# Patient Record
Sex: Male | Born: 1950 | Race: White | Hispanic: No | Smoking: Former smoker
Health system: Southern US, Community
[De-identification: ages and names within clinical notes are randomized; demographics above are authoritative.]

## PROBLEM LIST (undated history)

## (undated) DIAGNOSIS — I10 Essential (primary) hypertension: Secondary | ICD-10-CM

## (undated) DIAGNOSIS — Z8679 Personal history of other diseases of the circulatory system: Secondary | ICD-10-CM

## (undated) DIAGNOSIS — H53032 Strabismic amblyopia, left eye: Secondary | ICD-10-CM

## (undated) DIAGNOSIS — R32 Unspecified urinary incontinence: Secondary | ICD-10-CM

## (undated) DIAGNOSIS — M1990 Unspecified osteoarthritis, unspecified site: Secondary | ICD-10-CM

## (undated) DIAGNOSIS — R319 Hematuria, unspecified: Secondary | ICD-10-CM

## (undated) DIAGNOSIS — C801 Malignant (primary) neoplasm, unspecified: Secondary | ICD-10-CM

## (undated) DIAGNOSIS — R399 Unspecified symptoms and signs involving the genitourinary system: Secondary | ICD-10-CM

## (undated) DIAGNOSIS — H33191 Other retinoschisis and retinal cysts, right eye: Secondary | ICD-10-CM

## (undated) DIAGNOSIS — F411 Generalized anxiety disorder: Secondary | ICD-10-CM

## (undated) DIAGNOSIS — K219 Gastro-esophageal reflux disease without esophagitis: Secondary | ICD-10-CM

## (undated) DIAGNOSIS — F419 Anxiety disorder, unspecified: Secondary | ICD-10-CM

## (undated) DIAGNOSIS — R000 Tachycardia, unspecified: Secondary | ICD-10-CM

## (undated) DIAGNOSIS — I4891 Unspecified atrial fibrillation: Secondary | ICD-10-CM

## (undated) DIAGNOSIS — D649 Anemia, unspecified: Secondary | ICD-10-CM

## (undated) DIAGNOSIS — C679 Malignant neoplasm of bladder, unspecified: Secondary | ICD-10-CM

## (undated) DIAGNOSIS — K5909 Other constipation: Secondary | ICD-10-CM

## (undated) HISTORY — PX: CATARACT EXTRACTION W/ INTRAOCULAR LENS IMPLANT: SHX1309

## (undated) HISTORY — PX: STRABISMUS SURGERY: SHX218

## (undated) HISTORY — PX: ORIF FOOT FRACTURE: SHX2123

## (undated) HISTORY — PX: LUMBAR SPINE SURGERY: SHX701

## (undated) HISTORY — PX: ROTATOR CUFF REPAIR: SHX139

## (undated) HISTORY — PX: POSTERIOR CERVICAL LAMINECTOMY: SHX2248

## (undated) HISTORY — PX: PERCUTANEOUS PINNING: SHX2209

## (undated) HISTORY — PX: EYE SURGERY: SHX253

## (undated) HISTORY — PX: PROSTATECTOMY: SHX69

## (undated) HISTORY — PX: KNEE ARTHROSCOPY: SUR90

## (undated) HISTORY — PX: BACK SURGERY: SHX140

## (undated) HISTORY — PX: THUMB AMPUTATION: SHX804

## (undated) HISTORY — PX: CERVICAL LAMINECTOMY: SHX94

---

## 1998-05-19 DIAGNOSIS — Z8546 Personal history of malignant neoplasm of prostate: Secondary | ICD-10-CM

## 1998-05-19 DIAGNOSIS — Z923 Personal history of irradiation: Secondary | ICD-10-CM

## 1998-05-19 HISTORY — DX: Personal history of irradiation: Z92.3

## 1998-05-19 HISTORY — DX: Personal history of malignant neoplasm of prostate: Z85.46

## 1998-05-19 HISTORY — PX: RETROPUBIC PROSTATECTOMY: SUR1055

## 1998-09-13 ENCOUNTER — Other Ambulatory Visit: Admission: RE | Admit: 1998-09-13 | Discharge: 1998-09-13 | Payer: Self-pay | Admitting: Urology

## 1998-10-25 ENCOUNTER — Encounter: Payer: Self-pay | Admitting: Orthopedic Surgery

## 1998-10-29 ENCOUNTER — Inpatient Hospital Stay (HOSPITAL_COMMUNITY): Admission: RE | Admit: 1998-10-29 | Discharge: 1998-11-01 | Payer: Self-pay | Admitting: Urology

## 1999-05-29 ENCOUNTER — Encounter: Admission: RE | Admit: 1999-05-29 | Discharge: 1999-08-27 | Payer: Self-pay | Admitting: Radiation Oncology

## 2001-05-19 HISTORY — PX: THUMB AMPUTATION: SHX804

## 2001-06-10 ENCOUNTER — Ambulatory Visit (HOSPITAL_COMMUNITY): Admission: RE | Admit: 2001-06-10 | Discharge: 2001-06-10 | Payer: Self-pay | Admitting: Internal Medicine

## 2002-01-04 ENCOUNTER — Inpatient Hospital Stay (HOSPITAL_COMMUNITY): Admission: RE | Admit: 2002-01-04 | Discharge: 2002-01-05 | Payer: Self-pay | Admitting: Orthopedic Surgery

## 2002-04-20 ENCOUNTER — Encounter: Payer: Self-pay | Admitting: Orthopedic Surgery

## 2002-04-20 ENCOUNTER — Encounter: Admission: RE | Admit: 2002-04-20 | Discharge: 2002-04-20 | Payer: Self-pay | Admitting: Orthopedic Surgery

## 2002-04-22 ENCOUNTER — Ambulatory Visit (HOSPITAL_COMMUNITY): Admission: RE | Admit: 2002-04-22 | Discharge: 2002-04-22 | Payer: Self-pay | Admitting: Orthopedic Surgery

## 2002-04-22 ENCOUNTER — Encounter: Payer: Self-pay | Admitting: Orthopedic Surgery

## 2002-05-09 ENCOUNTER — Encounter: Payer: Self-pay | Admitting: Orthopedic Surgery

## 2002-05-09 ENCOUNTER — Ambulatory Visit (HOSPITAL_COMMUNITY): Admission: RE | Admit: 2002-05-09 | Discharge: 2002-05-09 | Payer: Self-pay | Admitting: Orthopedic Surgery

## 2002-05-19 HISTORY — PX: ROTATOR CUFF REPAIR: SHX139

## 2004-12-23 ENCOUNTER — Ambulatory Visit (HOSPITAL_COMMUNITY): Admission: RE | Admit: 2004-12-23 | Discharge: 2004-12-23 | Payer: Self-pay | Admitting: Orthopedic Surgery

## 2010-08-19 DIAGNOSIS — R079 Chest pain, unspecified: Secondary | ICD-10-CM

## 2013-11-09 NOTE — H&P (Signed)
  NTS SOAP Note  Vital Signs:  Vitals as of: 8/56/3149: Systolic 702: Diastolic 99: Heart Rate 81: Temp 97.56F: Height 16ft 9in: Weight 225Lbs 0 Ounces: Pain Level 2: BMI 33.23  BMI : 33.23 kg/m2  Subjective: This 63 Years 18 Months old Male presents for of an umbilical hernia.  Has been present for some time, but is increasing in size and causing him discomfort.  Review of Symptoms:  Constitutional:unremarkable   Head:unremarkable    Eyes:unremarkable   sinus problems Cardiovascular:  unremarkable   Respiratory:unremarkable   Gastrointestin    abdominal pain,nausea,heartburn Genitourinary:unremarkable       joint, neck, and back pain Skin:unremarkable Hematolgic/Lymphatic:unremarkable     Allergic/Immunologic:unremarkable     Past Medical History:    Reviewed  Past Medical History  Surgical History: back, prostate, hernia, shoulder, knee, eye Medical Problems: reflux Allergies: nkda Medications: pantoprazole, allegra, fluticasome nasal spray   Social History:Reviewed  Social History  Preferred Language: English Race:  White Ethnicity: Not Hispanic / Latino Age: 63 Years 7 Months Marital Status:  M Alcohol: no   Smoking Status: Never smoker reviewed on 11/08/2013 Functional Status reviewed on 11/08/2013 ------------------------------------------------ Bathing: Normal Cooking: Normal Dressing: Normal Driving: Normal Eating: Normal Managing Meds: Normal Oral Care: Normal Shopping: Normal Toileting: Normal Transferring: Normal Walking: Normal Cognitive Status reviewed on 11/08/2013 ------------------------------------------------ Attention: Normal Decision Making: Normal Language: Normal Memory: Normal Motor: Normal Perception: Normal Problem Solving: Normal Visual and Spatial: Normal   Family History:  Reviewed  Family Health History Mother  Father, Deceased; Alzheimer's disease;     Objective  Information: General:  Well appearing, well nourished in no distress. Heart:  RRR, no murmur Lungs:    CTA bilaterally, no wheezes, rhonchi, rales.  Breathing unlabored. Abdomen:Soft, NT/ND, no HSM, no masses.  Reudcible umbilical hernia noted.  Assessment:Umbilical hernia  Diagnoses: 637.8 Umbilical hernia (Umbilical hernia without obstruction or gangrene)  Procedures: 58850 - OFFICE OUTPATIENT NEW 30 MINUTES    Plan:  Scheduled for umbilicla herniorrhaphy with mesh on 11/23/13.   Patient Education:Alternative treatments to surgery were discussed with patient (and family).  Risks and benefits  of procedure including bleeding, infection, mesh use, and the possibility of an open procedure were fully explained to the patient (and family) who gave informed consent. Patient/family questions were addressed.  Follow-up:Pending Surgery

## 2013-11-17 ENCOUNTER — Encounter (HOSPITAL_COMMUNITY)
Admission: RE | Admit: 2013-11-17 | Discharge: 2013-11-17 | Disposition: A | Discharge status: Home / Self Care | Payer: BC Managed Care – PPO | Source: Ambulatory Visit | Attending: General Surgery | Admitting: General Surgery

## 2013-11-17 ENCOUNTER — Encounter (HOSPITAL_COMMUNITY): Payer: Self-pay | Admitting: Pharmacy Technician

## 2013-11-17 ENCOUNTER — Encounter (HOSPITAL_COMMUNITY): Payer: Self-pay

## 2013-11-17 DIAGNOSIS — Z01812 Encounter for preprocedural laboratory examination: Secondary | ICD-10-CM | POA: Insufficient documentation

## 2013-11-17 DIAGNOSIS — Z01810 Encounter for preprocedural cardiovascular examination: Secondary | ICD-10-CM | POA: Insufficient documentation

## 2013-11-17 HISTORY — DX: Malignant (primary) neoplasm, unspecified: C80.1

## 2013-11-17 HISTORY — DX: Gastro-esophageal reflux disease without esophagitis: K21.9

## 2013-11-17 LAB — BASIC METABOLIC PANEL
ANION GAP: 11 (ref 5–15)
BUN: 8 mg/dL (ref 6–23)
CALCIUM: 9.4 mg/dL (ref 8.4–10.5)
CO2: 28 meq/L (ref 19–32)
CREATININE: 0.74 mg/dL (ref 0.50–1.35)
Chloride: 104 mEq/L (ref 96–112)
Glucose, Bld: 69 mg/dL — ABNORMAL LOW (ref 70–99)
Potassium: 4.2 mEq/L (ref 3.7–5.3)
SODIUM: 143 meq/L (ref 137–147)

## 2013-11-17 LAB — CBC WITH DIFFERENTIAL/PLATELET
BASOS ABS: 0 10*3/uL (ref 0.0–0.1)
BASOS PCT: 1 % (ref 0–1)
Eosinophils Absolute: 0.2 10*3/uL (ref 0.0–0.7)
Eosinophils Relative: 4 % (ref 0–5)
HEMATOCRIT: 44.9 % (ref 39.0–52.0)
Hemoglobin: 15.1 g/dL (ref 13.0–17.0)
Lymphocytes Relative: 27 % (ref 12–46)
Lymphs Abs: 1.4 10*3/uL (ref 0.7–4.0)
MCH: 30.7 pg (ref 26.0–34.0)
MCHC: 33.6 g/dL (ref 30.0–36.0)
MCV: 91.3 fL (ref 78.0–100.0)
MONO ABS: 0.6 10*3/uL (ref 0.1–1.0)
Monocytes Relative: 11 % (ref 3–12)
NEUTROS ABS: 3.1 10*3/uL (ref 1.7–7.7)
Neutrophils Relative %: 59 % (ref 43–77)
PLATELETS: 304 10*3/uL (ref 150–400)
RBC: 4.92 MIL/uL (ref 4.22–5.81)
RDW: 13.7 % (ref 11.5–15.5)
WBC: 5.4 10*3/uL (ref 4.0–10.5)

## 2013-11-17 NOTE — Patient Instructions (Signed)
Philip Mcneil  11/17/2013   Your procedure is scheduled on:  11/23/2013  Report to [facility] at  25 AM.  Call this number if you have problems the morning of surgery: [phone]   Remember:   Do not eat food or drink liquids after midnight.   Take these medicines the morning of surgery with A SIP OF WATER: none   Do not wear jewelry, make-up or nail polish.  Do not wear lotions, powders, or perfumes.   Do not shave 48 hours prior to surgery. Men may shave face and neck.  Do not bring valuables to the hospital.  [facility] is not responsible for any belongings or valuables.               Contacts, dentures or bridgework may not be worn into surgery.  Leave suitcase in the car. After surgery it may be brought to your room.  For patients admitted to the hospital, discharge time is determined by your treatment team.               Patients discharged the day of surgery will not be allowed to drive home.  Name and phone number of your driver: family  Special Instructions: Shower using CHG 2 nights before surgery and the night before surgery.  If you shower the day of surgery use CHG.  Use special wash - you have one bottle of CHG for all showers.  You should use approximately 1/3 of the bottle for each shower.   Please read over the following fact sheets that you were given: Pain Booklet, Coughing and Deep Breathing, Surgical Site Infection Prevention, Anesthesia Post-op Instructions and Care and Recovery After Surgery Hernia A hernia occurs when an internal organ pushes out through a weak spot in the abdominal wall. Hernias most commonly occur in the groin and around the navel. Hernias often can be pushed back into place (reduced). Most hernias tend to get worse over time. Some abdominal hernias can get stuck in the opening (irreducible or incarcerated hernia) and cannot be reduced. An irreducible abdominal hernia which is tightly squeezed into the opening is at risk for impaired blood  supply (strangulated hernia). A strangulated hernia is a medical emergency. Because of the risk for an irreducible or strangulated hernia, surgery may be recommended to repair a hernia. CAUSES   Heavy lifting.  Prolonged coughing.  Straining to have a bowel movement.  A cut (incision) made during an abdominal surgery. HOME CARE INSTRUCTIONS   Bed rest is not required. You may continue your normal activities.  Avoid lifting more than 10 pounds (4.5 kg) or straining.  Cough gently. If you are a smoker it is best to stop. Even the best hernia repair can break down with the continual strain of coughing. Even if you do not have your hernia repaired, a cough will continue to aggravate the problem.  Do not wear anything tight over your hernia. Do not try to keep it in with an outside bandage or truss. These can damage abdominal contents if they are trapped within the hernia sac.  Eat a normal diet.  Avoid constipation. Straining over long periods of time will increase hernia size and encourage breakdown of repairs. If you cannot do this with diet alone, stool softeners may be used. SEEK IMMEDIATE MEDICAL CARE IF:   You have a fever.  You develop increasing abdominal pain.  You feel nauseous or vomit.  Your hernia is stuck outside the  abdomen, looks discolored, feels hard, or is tender.  You have any changes in your bowel habits or in the hernia that are unusual for you.  You have increased pain or swelling around the hernia.  You cannot push the hernia back in place by applying gentle pressure while lying down. MAKE SURE YOU:   Understand these instructions.  Will watch your condition.  Will get help right away if you are not doing well or get worse. Document Released: 05/05/2005 Document Revised: 07/28/2011 Document Reviewed: 12/23/2007 [facility] Patient Information 2015 [place], [place]. This information is not intended to replace advice given to you by your health care  provider. Make sure you discuss any questions you have with your health care provider. PATIENT INSTRUCTIONS POST-ANESTHESIA  IMMEDIATELY FOLLOWING SURGERY:  Do not drive or operate machinery for the first twenty four hours after surgery.  Do not make any important decisions for twenty four hours after surgery or while taking narcotic pain medications or sedatives.  If you develop intractable nausea and vomiting or a severe headache please notify your doctor immediately.  FOLLOW-UP:  Please make an appointment with your surgeon as instructed. You do not need to follow up with anesthesia unless specifically instructed to do so.  WOUND CARE INSTRUCTIONS (if applicable):  Keep a dry clean dressing on the anesthesia/puncture wound site if there is drainage.  Once the wound has quit draining you may leave it open to air.  Generally you should leave the bandage intact for twenty four hours unless there is drainage.  If the epidural site drains for more than 36-48 hours please call the anesthesia department.  QUESTIONS?:  Please feel free to call your physician or the hospital operator if you have any questions, and they will be happy to assist you.

## 2013-11-17 NOTE — Pre-Procedure Instructions (Signed)
Patient given information to sign up for my chart at home. 

## 2013-11-23 ENCOUNTER — Encounter (HOSPITAL_COMMUNITY)
Admission: RE | Disposition: A | Discharge status: Home / Self Care | Payer: Self-pay | Source: Ambulatory Visit | Attending: General Surgery

## 2013-11-23 ENCOUNTER — Ambulatory Visit (HOSPITAL_COMMUNITY): Payer: BC Managed Care – PPO | Admitting: Anesthesiology

## 2013-11-23 ENCOUNTER — Ambulatory Visit (HOSPITAL_COMMUNITY)
Admission: RE | Admit: 2013-11-23 | Discharge: 2013-11-23 | Disposition: A | Discharge status: Home / Self Care | Payer: BC Managed Care – PPO | Source: Ambulatory Visit | Attending: General Surgery | Admitting: General Surgery

## 2013-11-23 ENCOUNTER — Encounter (HOSPITAL_COMMUNITY): Payer: BC Managed Care – PPO | Admitting: Anesthesiology

## 2013-11-23 ENCOUNTER — Encounter (HOSPITAL_COMMUNITY): Payer: Self-pay | Admitting: *Deleted

## 2013-11-23 DIAGNOSIS — Z79899 Other long term (current) drug therapy: Secondary | ICD-10-CM | POA: Insufficient documentation

## 2013-11-23 DIAGNOSIS — K429 Umbilical hernia without obstruction or gangrene: Secondary | ICD-10-CM | POA: Insufficient documentation

## 2013-11-23 DIAGNOSIS — K219 Gastro-esophageal reflux disease without esophagitis: Secondary | ICD-10-CM | POA: Insufficient documentation

## 2013-11-23 HISTORY — PX: INSERTION OF MESH: SHX5868

## 2013-11-23 HISTORY — PX: UMBILICAL HERNIA REPAIR: SHX196

## 2013-11-23 SURGERY — REPAIR, HERNIA, UMBILICAL, ADULT
Anesthesia: General | Site: Abdomen

## 2013-11-23 MED ORDER — ONDANSETRON HCL 4 MG/2ML IJ SOLN
4.0000 mg | Freq: Once | INTRAMUSCULAR | Status: AC
  Administered 2013-11-23: 4 mg via INTRAVENOUS

## 2013-11-23 MED ORDER — GLYCOPYRROLATE 0.2 MG/ML IJ SOLN
0.2000 mg | Freq: Once | INTRAMUSCULAR | Status: AC
  Administered 2013-11-23: 0.2 mg via INTRAVENOUS

## 2013-11-23 MED ORDER — KETOROLAC TROMETHAMINE 30 MG/ML IJ SOLN
30.0000 mg | Freq: Once | INTRAMUSCULAR | Status: AC
  Administered 2013-11-23: 30 mg via INTRAVENOUS
  Filled 2013-11-23: qty 1

## 2013-11-23 MED ORDER — GLYCOPYRROLATE 0.2 MG/ML IJ SOLN
INTRAMUSCULAR | Status: AC
  Filled 2013-11-23: qty 1

## 2013-11-23 MED ORDER — CEFAZOLIN SODIUM-DEXTROSE 2-3 GM-% IV SOLR
2.0000 g | INTRAVENOUS | Status: AC
  Administered 2013-11-23: 2 g via INTRAVENOUS
  Filled 2013-11-23: qty 50

## 2013-11-23 MED ORDER — POVIDONE-IODINE 10 % OINT PACKET
TOPICAL_OINTMENT | CUTANEOUS | Status: DC | PRN
  Administered 2013-11-23: 2 via TOPICAL

## 2013-11-23 MED ORDER — LIDOCAINE HCL 1 % IJ SOLN
INTRAMUSCULAR | Status: DC | PRN
  Administered 2013-11-23: 30 mg via INTRADERMAL

## 2013-11-23 MED ORDER — ONDANSETRON HCL 4 MG/2ML IJ SOLN: 4.0000 mg | Freq: Once | INTRAMUSCULAR | Status: DC | PRN

## 2013-11-23 MED ORDER — FENTANYL CITRATE 0.05 MG/ML IJ SOLN
INTRAMUSCULAR | Status: DC | PRN
  Administered 2013-11-23: 25 ug via INTRAVENOUS
  Administered 2013-11-23: 50 ug via INTRAVENOUS
  Administered 2013-11-23: 25 ug via INTRAVENOUS
  Administered 2013-11-23: 50 ug via INTRAVENOUS
  Administered 2013-11-23 (×2): 25 ug via INTRAVENOUS
  Administered 2013-11-23: 50 ug via INTRAVENOUS

## 2013-11-23 MED ORDER — FENTANYL CITRATE 0.05 MG/ML IJ SOLN
INTRAMUSCULAR | Status: AC
  Filled 2013-11-23: qty 2

## 2013-11-23 MED ORDER — POVIDONE-IODINE 10 % EX OINT
TOPICAL_OINTMENT | CUTANEOUS | Status: AC
  Filled 2013-11-23: qty 2

## 2013-11-23 MED ORDER — PROPOFOL 10 MG/ML IV BOLUS
INTRAVENOUS | Status: DC | PRN
  Administered 2013-11-23: 150 mg via INTRAVENOUS

## 2013-11-23 MED ORDER — FENTANYL CITRATE 0.05 MG/ML IJ SOLN
INTRAMUSCULAR | Status: AC
  Filled 2013-11-23: qty 5

## 2013-11-23 MED ORDER — FENTANYL CITRATE 0.05 MG/ML IJ SOLN
25.0000 ug | INTRAMUSCULAR | Status: AC
  Administered 2013-11-23 (×2): 25 ug via INTRAVENOUS

## 2013-11-23 MED ORDER — OXYCODONE-ACETAMINOPHEN 7.5-325 MG PO TABS: 1.0000 | ORAL_TABLET | ORAL | Status: DC | PRN

## 2013-11-23 MED ORDER — METOPROLOL TARTRATE 1 MG/ML IV SOLN
5.0000 mg | Freq: Once | INTRAVENOUS | Status: AC
  Administered 2013-11-23: 1 mg via INTRAVENOUS

## 2013-11-23 MED ORDER — PROPOFOL 10 MG/ML IV EMUL
INTRAVENOUS | Status: AC
  Filled 2013-11-23: qty 20

## 2013-11-23 MED ORDER — LACTATED RINGERS IV SOLN
INTRAVENOUS | Status: DC
  Administered 2013-11-23 (×2): via INTRAVENOUS

## 2013-11-23 MED ORDER — MIDAZOLAM HCL 2 MG/2ML IJ SOLN
1.0000 mg | INTRAMUSCULAR | Status: DC | PRN
  Administered 2013-11-23: 2 mg via INTRAVENOUS

## 2013-11-23 MED ORDER — CHLORHEXIDINE GLUCONATE 4 % EX LIQD: 1.0000 "application " | Freq: Once | CUTANEOUS | Status: DC

## 2013-11-23 MED ORDER — BUPIVACAINE HCL (PF) 0.5 % IJ SOLN
INTRAMUSCULAR | Status: DC | PRN
  Administered 2013-11-23: 10 mL

## 2013-11-23 MED ORDER — METOPROLOL TARTRATE 1 MG/ML IV SOLN
1.0000 mg | Freq: Once | INTRAVENOUS | Status: AC
  Administered 2013-11-23: 1 mg via INTRAVENOUS

## 2013-11-23 MED ORDER — 0.9 % SODIUM CHLORIDE (POUR BTL) OPTIME
TOPICAL | Status: DC | PRN
  Administered 2013-11-23: 1000 mL

## 2013-11-23 MED ORDER — ONDANSETRON HCL 4 MG/2ML IJ SOLN
INTRAMUSCULAR | Status: AC
  Filled 2013-11-23: qty 2

## 2013-11-23 MED ORDER — FENTANYL CITRATE 0.05 MG/ML IJ SOLN
25.0000 ug | INTRAMUSCULAR | Status: DC | PRN
  Administered 2013-11-23: 50 ug via INTRAVENOUS
  Filled 2013-11-23: qty 2

## 2013-11-23 MED ORDER — BUPIVACAINE HCL (PF) 0.5 % IJ SOLN
INTRAMUSCULAR | Status: AC
  Filled 2013-11-23: qty 30

## 2013-11-23 MED ORDER — MIDAZOLAM HCL 2 MG/2ML IJ SOLN
INTRAMUSCULAR | Status: AC
  Filled 2013-11-23: qty 2

## 2013-11-23 MED ORDER — METOPROLOL TARTRATE 1 MG/ML IV SOLN
INTRAVENOUS | Status: AC
  Filled 2013-11-23: qty 5

## 2013-11-23 SURGICAL SUPPLY — 37 items
BAG HAMPER (MISCELLANEOUS) ×3 IMPLANT
BLADE 11 SAFETY STRL DISP (BLADE) ×3 IMPLANT
CLOTH BEACON ORANGE TIMEOUT ST (SAFETY) ×3 IMPLANT
COVER LIGHT HANDLE STERIS (MISCELLANEOUS) ×6 IMPLANT
DECANTER SPIKE VIAL GLASS SM (MISCELLANEOUS) ×3 IMPLANT
DURAPREP 26ML APPLICATOR (WOUND CARE) ×3 IMPLANT
ELECT REM PT RETURN 9FT ADLT (ELECTROSURGICAL) ×3
ELECTRODE REM PT RTRN 9FT ADLT (ELECTROSURGICAL) ×1 IMPLANT
GLOVE BIOGEL PI IND STRL 6.5 (GLOVE) ×1 IMPLANT
GLOVE BIOGEL PI IND STRL 7.0 (GLOVE) ×1 IMPLANT
GLOVE BIOGEL PI IND STRL 7.5 (GLOVE) ×1 IMPLANT
GLOVE BIOGEL PI INDICATOR 6.5 (GLOVE) ×2
GLOVE BIOGEL PI INDICATOR 7.0 (GLOVE) ×2
GLOVE BIOGEL PI INDICATOR 7.5 (GLOVE) ×2
GLOVE ECLIPSE 6.5 STRL STRAW (GLOVE) ×6 IMPLANT
GLOVE ECLIPSE 7.0 STRL STRAW (GLOVE) ×3 IMPLANT
GLOVE EXAM NITRILE MD LF STRL (GLOVE) ×3 IMPLANT
GLOVE SURG SS PI 7.5 STRL IVOR (GLOVE) ×6 IMPLANT
GOWN STRL REUS W/TWL LRG LVL3 (GOWN DISPOSABLE) ×12 IMPLANT
INST SET MINOR GENERAL (KITS) ×3 IMPLANT
KIT ROOM TURNOVER APOR (KITS) ×3 IMPLANT
MANIFOLD NEPTUNE II (INSTRUMENTS) ×3 IMPLANT
MESH VENTRALEX ST 2.5 CRC MED (Mesh General) ×3 IMPLANT
NS IRRIG 1000ML POUR BTL (IV SOLUTION) ×3 IMPLANT
PACK MINOR (CUSTOM PROCEDURE TRAY) ×3 IMPLANT
PAD ARMBOARD 7.5X6 YLW CONV (MISCELLANEOUS) ×3 IMPLANT
SET BASIN LINEN APH (SET/KITS/TRAYS/PACK) ×3 IMPLANT
SPONGE GAUZE 2X2 8PLY STER LF (GAUZE/BANDAGES/DRESSINGS) ×2
SPONGE GAUZE 2X2 8PLY STRL LF (GAUZE/BANDAGES/DRESSINGS) ×4 IMPLANT
STAPLER VISISTAT (STAPLE) ×3 IMPLANT
SUT ETHIBOND NAB MO 7 #0 18IN (SUTURE) ×3 IMPLANT
SUT VIC AB 2-0 CT2 27 (SUTURE) ×3 IMPLANT
SUT VIC AB 3-0 SH 27 (SUTURE) ×2
SUT VIC AB 3-0 SH 27X BRD (SUTURE) ×1 IMPLANT
SUT VICRYL AB 3 0 TIES (SUTURE) IMPLANT
SYR CONTROL 10ML LL (SYRINGE) ×3 IMPLANT
TAPE CLOTH SURG 4X10 WHT LF (GAUZE/BANDAGES/DRESSINGS) ×3 IMPLANT

## 2013-11-23 NOTE — Anesthesia Preprocedure Evaluation (Signed)
Anesthesia Evaluation  Patient identified by MRN, date of birth, ID band Patient awake    Reviewed: Allergy & Precautions, H&P , NPO status , Patient's Chart, lab work & pertinent test results  Airway Mallampati: I TM Distance: >3 FB Neck ROM: Full    Dental  (+) Teeth Intact   Pulmonary former smoker,  breath sounds clear to auscultation        Cardiovascular negative cardio ROS  Rhythm:Regular Rate:Normal     Neuro/Psych    GI/Hepatic GERD-  Medicated and Controlled,  Endo/Other    Renal/GU      Musculoskeletal   Abdominal   Peds  Hematology   Anesthesia Other Findings   Reproductive/Obstetrics                           Anesthesia Physical Anesthesia Plan  ASA: II  Anesthesia Plan: General   Post-op Pain Management:    Induction: Intravenous  Airway Management Planned: LMA  Additional Equipment:   Intra-op Plan:   Post-operative Plan: Extubation in OR  Informed Consent: I have reviewed the patients History and Physical, chart, labs and discussed the procedure including the risks, benefits and alternatives for the proposed anesthesia with the patient or authorized representative who has indicated his/her understanding and acceptance.     Plan Discussed with:   Anesthesia Plan Comments:         Anesthesia Quick Evaluation

## 2013-11-23 NOTE — Progress Notes (Signed)
Discussed with patients spouse regarding incident and anesthesias concern. Instructed patient to see cardiologist as soon as possible. Patient and wife kept explaining that a couple of months ago his regular physician have done an exam and EKG. Stated they would follow up ASAP.

## 2013-11-23 NOTE — Discharge Instructions (Signed)
Umbilical Herniorrhaphy Care After Refer to this sheet in the next few weeks. These instructions provide you with information on caring for yourself after your procedure. Your caregiver may also give you more specific instructions. Your treatment has been planned according to current medical practices, but problems sometimes occur. Call your caregiver if you have any problems or questions after your procedure. HOME CARE INSTRUCTIONS  If you are given antibiotic medicine, take it as directed. Finish it even if you start to feel better.  Only take over-the-counter or prescription medicines for pain, fever, or discomfort as directed by your caregiver. Do not take aspirin. It can cause bleeding.  Do not get your surgical cut (incision) area wet unless your caregiver says it is okay.  Avoid lifting objects heavier than 10 lb (4.5 kg) for 8 weeks after surgery.  Avoid sexual activity for 5 weeks after surgery or as directed by your caregiver.  Do not drive while taking prescription pain medicine.  You may return to your other normal, daily activities after 3 days or as directed by your caregiver. SEEK MEDICAL CARE IF:  You notice blood or fluid leaking from the surgical site.  Your incision area becomes red or swollen.  Your pain at the surgical site becomes worse or is not relieved by medicine.  You have problems urinating.  You feel nauseous or vomit more than 2 days after surgery.  You notice the bulge in your abdomen returns after the procedure. SEEK IMMEDIATE MEDICAL CARE IF:  You have a fever.  You have nausea or vomiting that will not stop. MAKE SURE YOU:  Understand these instructions.  Will watch your condition.  Will get help right away if you are not doing well or get worse. Document Released: 11/04/2011 Document Reviewed: 11/04/2011 [facility] Patient Information 2015 [place]. This information is not intended to replace advice given to you by your health care  provider. Make sure you discuss any questions you have with your health care provider.

## 2013-11-23 NOTE — Anesthesia Procedure Notes (Signed)
Procedure Name: LMA Insertion Date/Time: 11/23/2013 10:39 AM Performed by: Tressie Stalker E Pre-anesthesia Checklist: Patient identified, Patient being monitored, Emergency Drugs available, Timeout performed and Suction available Patient Re-evaluated:Patient Re-evaluated prior to inductionOxygen Delivery Method: Circle System Utilized Preoxygenation: Pre-oxygenation with 100% oxygen Intubation Type: IV induction Ventilation: Mask ventilation without difficulty LMA: LMA inserted LMA Size: 5.0 Number of attempts: 1 Placement Confirmation: positive ETCO2 and breath sounds checked- equal and bilateral

## 2013-11-23 NOTE — Op Note (Signed)
Patient:  Philip Mcneil  DOB:  12/08/1950  MRN:  [id number]   Preop Diagnosis:  Umbilical hernia  Postop Diagnosis:  Same  Procedure:  Umbilical herniorrhaphy with mesh  Surgeon:  Aviva Signs, M.D.  Anes:  General  Indications:  Patient is a 63 year old white male who presents with a symptomatic umbilical hernia. The risks and benefits of the procedure including bleeding, infection, and the possibility of recurrence of the hernia were fully explained to the patient, who gave informed consent.  Procedure note:  The patient's placed the supine position. After induction of general endotracheal anesthesia, the abdomen was prepped and draped using usual sterile technique with DuraPrep. Surgical site confirmation was performed.  An infraumbilical incision was made down to the fascia. The underlying hernia sac was freed away from the overlying umbilical skin. The patient had a 2 cm umbilical neck with a large amount of omentum within the hernia sac. The hernia sac was excised and the omentum was reduced into the abdominal cavity without difficulty. A 6.4 cm ventralax patch was then inserted and secured to the fascia using 0 Ethibond interrupted sutures. The overlying fascia was reapproximated over the mesh using 0 Ethibond interrupted sutures in a transverse fashion. The base of the umbilicus was secured back to the abdominal wall using 2-0 Vicryl interrupted suture. Subcutaneous layer was reapproximated using 3-0 Vicryl interrupted suture. The skin was closed using staples. 0.5% Sensorcaine was instilled the surrounding wound. Betadine ointment and a dry sterile dressing were applied.  All tape and needle counts were correct the end of the procedure. Patient was extubated in the operating room and transferred to PACU in stable condition.  Complications:  None  EBL:  Minimal  Specimen:  None

## 2013-11-23 NOTE — Interval H&P Note (Signed)
History and Physical Interval Note:  11/23/2013 9:56 AM  Philip Mcneil  has presented today for surgery, with the diagnosis of UMBILICAL HERNIA  The various methods of treatment have been discussed with the patient and family. After consideration of risks, benefits and other options for treatment, the patient has consented to  Procedure(s): HERNIA REPAIR UMBILICAL ADULT WITH MESH (N/A) as a surgical intervention .  The patient's history has been reviewed, patient examined, no change in status, stable for surgery.  I have reviewed the patient's chart and labs.  Questions were answered to the patient's satisfaction.     Aviva Signs A

## 2013-11-23 NOTE — Progress Notes (Signed)
Patient maintaining HR in the 80s still BP at baseline. Dr Duwayne Heck aware ok for patient to go to post op.

## 2013-11-23 NOTE — Anesthesia Postprocedure Evaluation (Signed)
  Anesthesia Post-op Note  Patient: Philip Mcneil  Procedure(s) Performed: Procedure(s): UMBILICAL HERNIORRHAPHY (N/A) INSERTION OF MESH (N/A)  Patient Location: PACU  Anesthesia Type:General  Level of Consciousness: awake, alert  and oriented  Airway and Oxygen Therapy: Patient Spontanous Breathing and Patient connected to face mask oxygen  Post-op Pain: none  Post-op Assessment: Post-op Vital signs reviewed, Patient's Cardiovascular Status Stable, Respiratory Function Stable, Patent Airway and No signs of Nausea or vomiting  Post-op Vital Signs: Reviewed and stable  Last Vitals:  Filed Vitals:   11/23/13 1015  BP: 145/95  Temp:   Resp: 14    Complications: No apparent anesthesia complications

## 2013-11-23 NOTE — Progress Notes (Signed)
Patient with run of SVT 147-156  for about 30 secs or less unable to run strip due to artifact and short run of SVT. Dr Duwayne Heck witness to HR in the 130s ordered lopressor up to 5 mg if needed. Patient received 2 mg HR maintained in the 80s  After 2nd dose. Patient assymtomatic throughout episode.

## 2013-11-23 NOTE — Transfer of Care (Signed)
Immediate Anesthesia Transfer of Care Note  Patient: Philip Mcneil  Procedure(s) Performed: Procedure(s): UMBILICAL HERNIORRHAPHY (N/A) INSERTION OF MESH (N/A)  Patient Location: PACU  Anesthesia Type:General  Level of Consciousness: awake  Airway & Oxygen Therapy: Patient Spontanous Breathing and Patient connected to face mask oxygen  Post-op Assessment: Report given to PACU RN  Post vital signs: Reviewed and stable  Complications: No apparent anesthesia complications

## 2013-11-24 ENCOUNTER — Encounter (HOSPITAL_COMMUNITY): Payer: Self-pay | Admitting: General Surgery

## 2015-08-21 ENCOUNTER — Encounter: Payer: Self-pay | Admitting: Allergy and Immunology

## 2015-08-21 ENCOUNTER — Ambulatory Visit (INDEPENDENT_AMBULATORY_CARE_PROVIDER_SITE_OTHER): Payer: BLUE CROSS/BLUE SHIELD | Admitting: Allergy and Immunology

## 2015-08-21 VITALS — BP 134/82 | HR 80 | Temp 98.6°F | Resp 16 | Ht 66.14 in | Wt 202.0 lb

## 2015-08-21 DIAGNOSIS — R05 Cough: Secondary | ICD-10-CM

## 2015-08-21 DIAGNOSIS — H1010 Acute atopic conjunctivitis, unspecified eye: Secondary | ICD-10-CM

## 2015-08-21 DIAGNOSIS — J309 Allergic rhinitis, unspecified: Secondary | ICD-10-CM | POA: Diagnosis not present

## 2015-08-21 DIAGNOSIS — R059 Cough, unspecified: Secondary | ICD-10-CM

## 2015-08-21 MED ORDER — AZELASTINE-FLUTICASONE 137-50 MCG/ACT NA SUSP: 1.0000 | Freq: Two times a day (BID) | NASAL | Status: DC

## 2015-08-21 NOTE — Progress Notes (Signed)
NEW PATIENT NOTE  RE: Philip Mcneil MRN: [id number] DOB: Aug 21, 1950 [facility] Philip Mcneil. [address] [place], [place] [address] Date of Office Visit: 08/21/2015  Dear Philip Hilding, MD:  I had the pleasure of seeing Philip Mcneil  today in initial evaluation as you recall-- Subjective:  Philip Mcneil is a 65 y.o. male who presents today for Nasal Congestion; Cough; and Headache  Assessment:   1. 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    2. Cough, clear lung exam and excellent in office spirometry.    Plan:   Meds ordered this encounter  Medications  . Azelastine-Fluticasone 137-50 MCG/ACT SUSP    Sig: Place 1 spray into the nose 2 (two) times daily.    Dispense:  1 Bottle    Refill:  5   1. Avoidance: Mold 2. Antihistamine: Zyrtec 10 mg by mouth once daily for runny nose or itching. 3. Nasal Spray: Saline nasal wash followed by Dymista one spray(s) each nostril twice daily for stuffy nose or drainage.    4.  May consider sinus CT scan +/- ENT evaluation if persisting difficulty.  5.  Follow up Visit: 2 months or sooner if needed.  HPI: [name] presents to the office with a 17 year history of year-round sneezing, rhinorrhea, congestion, ears/eye pressure, itchy watery eyes, postnasal drip which seems greater in the last 2 years.  He occasionally notes a rare cough without shortness of breath, difficulty breathing, chest congestion or tightness, but was wondering about allergies, given increasing symptoms with outdoor, fluctuant weather pattern, pollen, dust, strong odor/perfume exposures.  He describes less energy recently without fever, muscle aches, discolored drainage, GI upset or rash.  He recalls snoring but has no disruptive sleep, nor exercise induced difficulty.  No known food sensitivities or history of sinus infections. Last chest x-ray was 5 years ago and has received cortisone injection for management of his  symptoms. Denies ED or Urgent care visits, oral prednisone or antibiotic courses.  Medical History: Past Medical History  Diagnosis Date  . GERD (gastroesophageal reflux disease)   . Cancer [facility])    Surgical History: Past Surgical History  Procedure Laterality Date  . Prostatectomy      [place]  . Back surgery      x2  . Cervical laminectomy    . Rotator cuff repair Right   . Knee arthroscopy Left   . Thumb amputation Left   . Orif foot fracture Right   . Umbilical hernia repair N/A 11/23/2013    Procedure: UMBILICAL HERNIORRHAPHY;  Surgeon: Jamesetta So, MD;  Location: [facility];  Service: General;  Laterality: N/A;  . Insertion of mesh N/A 11/23/2013    Procedure: INSERTION OF MESH;  Surgeon: Jamesetta So, MD;  Location: [facility];  Service: General;  Laterality: N/A;   Family History: Family History  Problem Relation Age of Onset  . Allergic rhinitis Neg Hx   . Asthma Neg Hx   . Eczema Neg Hx   . Immunodeficiency Neg Hx   . Urticaria Neg Hx    Social History: Social History  . Marital Status:     Spouse Name: N/A  . Number of Children: N/A  . Years of Education: N/A   Occupational History  . Not on file.   Social History Main Topics  . Smoking status: Former Smoker -- 0.25 packs/day for 20 years    Types: Cigarettes    Quit date: 11/18/2003  .  Smokeless tobacco: Never Used  . Alcohol Use: No  . Drug Use: No  . Sexual Activity: Yes    Birth Control/ Protection: None   Social History Narrative  [name], a widower and former smoker, has rare alcohol ingestion.  Philip Mcneil has a current medication list which includes the following prescription(s): cetirizine, fluticasone, ketoconazole, montelukast, pantoprazole, azelastine-fluticasone, fexofenadine-pseudoephedrine, and oxycodone-acetaminophen.   Drug Allergies: No Known Allergies  Environmental History: Philip Mcneil lives in a 65 year old house for 13 years with carpet floors, with central heat and air; stuffed mattress,  non-feather pillow/comforter with humidifier and no pets and smokers.   Review of Systems  Constitutional: Negative for fever, weight loss and malaise/fatigue.  HENT: Positive for congestion. Negative for ear pain, hearing loss, nosebleeds and sore throat.   Eyes: Negative for discharge and redness.       Corrective eyeglasses lenses for 20 years.  Respiratory: Negative for shortness of breath.        Denies history of bronchitis and pneumonia.  Gastrointestinal: Positive for heartburn (Well controlled.). Negative for nausea, vomiting, abdominal pain, diarrhea and constipation.  Genitourinary: Negative.   Musculoskeletal: Negative for myalgias.  Skin: Negative.  Negative for itching and rash.  Neurological: Positive for headaches (Intermittent.). Negative for dizziness, seizures and weakness.  Endo/Heme/Allergies: Positive for environmental allergies.       Denies sensitivity to aspirin, NSAIDs, stinging insects, foods, latex, jewelry and cosmetics.  Immunological: No chronic or recurring infections. Objective:   Filed Vitals:   08/21/15 1348  BP: 134/82  Pulse: 80  Temp: 98.6 F (37 C)  Resp: 16   SpO2 Readings from Last 1 Encounters:  08/21/15 95%   Physical Exam  Constitutional: He is well-developed, well-nourished, and in no distress.  HENT:  Head: Atraumatic.  Right Ear: Tympanic membrane and ear canal normal.  Left Ear: Tympanic membrane and ear canal normal.  Nose: Mucosal edema present. No rhinorrhea. No epistaxis.  Mouth/Throat: Oropharynx is clear and moist and mucous membranes are normal. No oropharyngeal exudate, posterior oropharyngeal edema or posterior oropharyngeal erythema.  Eyes: Conjunctivae are normal.  Neck: Neck supple.  Cardiovascular: Normal rate, S1 normal and S2 normal.   No murmur heard. Pulmonary/Chest: Effort normal and breath sounds normal. He has no wheezes. He has no rhonchi. He has no rales.  Abdominal: Soft. Bowel sounds are normal.    Lymphadenopathy:    He has no cervical adenopathy.  Neurological: He is alert.  Skin: Skin is warm and intact. No rash noted. No cyanosis. Nails show no clubbing.   Diagnostics: Spirometry:  FVC 4.07--111%,  FEV1 3.46--120%.    Skin testing:  Mild reactivity to selected mold species.    Roselyn M. Ishmael Holter, MD   cc: Philip Hilding, MD

## 2015-08-21 NOTE — Patient Instructions (Signed)
Take Home Sheet  1. Avoidance: Mold   2. Antihistamine: Zyrtec 10 mg by mouth once daily for runny nose or itching.   3. Nasal Spray: Dymista one spray(s) each nostril twice daily for stuffy nose or drainage.      4.   Follow up Visit: 2 months or sooner if needed.   Websites that have reliable Patient information: 1. American Academy of Asthma, Allergy, & Immunology: www.aaaai.org 2. Food Allergy Network: www.foodallergy.org 3. Mothers of Asthmatics: www.aanma.org 4. [place]: DiningCalendar.de 5. American College of Allergy, Asthma, & Immunology: https://robertson.info/ or www.acaai.org

## 2015-10-23 ENCOUNTER — Encounter: Payer: Self-pay | Admitting: Allergy and Immunology

## 2015-10-23 ENCOUNTER — Ambulatory Visit (INDEPENDENT_AMBULATORY_CARE_PROVIDER_SITE_OTHER): Payer: BLUE CROSS/BLUE SHIELD | Admitting: Allergy and Immunology

## 2015-10-23 VITALS — BP 130/90 | HR 90 | Temp 98.4°F | Resp 16

## 2015-10-23 DIAGNOSIS — R05 Cough: Secondary | ICD-10-CM | POA: Diagnosis not present

## 2015-10-23 DIAGNOSIS — H1010 Acute atopic conjunctivitis, unspecified eye: Secondary | ICD-10-CM

## 2015-10-23 DIAGNOSIS — J309 Allergic rhinitis, unspecified: Secondary | ICD-10-CM | POA: Diagnosis not present

## 2015-10-23 DIAGNOSIS — R059 Cough, unspecified: Secondary | ICD-10-CM

## 2015-10-23 NOTE — Progress Notes (Signed)
     FOLLOW UP NOTE  RE: ESTEBAN GIFFIN MRN: [id number] DOB: 1951-01-05 [facility] [address] [place], [place] [address] Date of Office Visit: 10/23/2015  Subjective:  KRIST BUCHOLZ is a 65 y.o. male who presents today for Allergic Rhinitis   Assessment:   1. Allergic rhinoconjunctivitis, well controlled.    2. Cough, resolved.    Plan:  No orders of the defined types were placed in this encounter.   1.  Continue current medication regime--- Singulair daily and Dymista one spray twice daily--may decrease to one spray each morning only in the next month and continues to be symptom-free. 2.  Saline nasal wash as needed. 3.  Samples and coupons given today. 4.  Follow-up in December 2017 or sooner if needed.  HPI: Ryver returns to the office in follow-up of allergic rhinitis and intermittent cough since his initial evaluation.  He reports significant improvement--- "1000 times better".  He is very pleased with the medication regime and has not really needed any Zyrtec in the recent days maintaining consistently with the nasal spray and Singulair.  He reports no further epistaxis or blood tinged mucus.  He does report nasal spray is slightly expensive and but was able to use coupon from the pharmacy.  Reports breathing is very good, no cough, no chest congestion,  throat irritation, postnasal drip, headache, discolored drainage or other recurring difficulty. Denies ED or urgent care visits, prednisone or antibiotic courses. Reports sleep and activity are normal.  Zahi has a current medication list which includes the following prescription(s): alprazolam, azelastine-fluticasone, cetirizine, citalopram, montelukast, pantoprazole.  Drug Allergies: No Known Allergies  Objective:   Filed Vitals:   10/23/15 1439  BP: 130/90  Pulse: 90  Temp: 98.4 F (36.9 C)  Resp: 16   SpO2 Readings from Last 1 Encounters:  10/23/15 95%   Physical Exam    Constitutional: He is well-developed, well-nourished, and in no distress.  HENT:  Head: Atraumatic.  Right Ear: Tympanic membrane and ear canal normal.  Left Ear: Tympanic membrane and ear canal normal.  Nose: Mucosal edema present. No rhinorrhea. No epistaxis.  Mouth/Throat: Oropharynx is clear and moist and mucous membranes are normal. No oropharyngeal exudate, posterior oropharyngeal edema or posterior oropharyngeal erythema.  Eyes: Conjunctivae are normal.  Neck: Neck supple.  Cardiovascular: Normal rate, S1 normal and S2 normal.   No murmur heard. Pulmonary/Chest: Effort normal and breath sounds normal. He has no wheezes. He has no rhonchi. He has no rales.  Lymphadenopathy:    He has no cervical adenopathy.  Skin: Skin is warm and intact. No rash noted. No cyanosis. Nails show no clubbing.     Roselyn M. Ishmael Holter, MD  cc: [name], MD

## 2015-10-23 NOTE — Patient Instructions (Signed)
    Continue current medication regime--- Dymista one spray twice daily--may decrease to one spray each morning only in the next month and continues to be symptom-free.  Saline nasal wash as needed.  Samples and coupons given today.  Follow-up in December 2017 or sooner if needed.

## 2016-04-22 ENCOUNTER — Encounter (INDEPENDENT_AMBULATORY_CARE_PROVIDER_SITE_OTHER): Payer: Self-pay

## 2016-04-22 ENCOUNTER — Ambulatory Visit (INDEPENDENT_AMBULATORY_CARE_PROVIDER_SITE_OTHER): Payer: Medicare Other | Admitting: Allergy & Immunology

## 2016-04-22 ENCOUNTER — Encounter: Payer: Self-pay | Admitting: Allergy & Immunology

## 2016-04-22 VITALS — BP 130/82 | HR 88 | Temp 98.1°F | Ht 66.0 in | Wt 206.4 lb

## 2016-04-22 DIAGNOSIS — H1010 Acute atopic conjunctivitis, unspecified eye: Secondary | ICD-10-CM

## 2016-04-22 DIAGNOSIS — J309 Allergic rhinitis, unspecified: Secondary | ICD-10-CM | POA: Diagnosis not present

## 2016-04-22 MED ORDER — AZELASTINE HCL 0.15 % NA SOLN: 2.0000 | Freq: Two times a day (BID) | NASAL | 5 refills | Status: DC

## 2016-04-22 MED ORDER — FLUTICASONE PROPIONATE 50 MCG/ACT NA SUSP: 2.0000 | Freq: Every day | NASAL | 5 refills | Status: DC

## 2016-04-22 NOTE — Patient Instructions (Addendum)
1. Allergic rhinoconjunctivitis - We will send you the two separate nose sprays: fluticasone and azelastine - This will take the place of Dysmista. - Start nasal saline rinses twice daily to keep the nose moisturized. - Consider allergy shots for further control if there is no improvement.  2. Return in about 6 months (around 10/21/2016).  Please inform us of any Emergency Department visits, hospitalizations, or changes in symptoms. Call us before going to the ED for breathing or allergy symptoms since we might be able to fit you in for a sick visit. Feel free to contact us anytime with any questions, problems, or concerns.  It was a pleasure to meet you today! Have a wonderful holiday season!   Websites that have reliable patient information: 1. American Academy of Asthma, Allergy, and Immunology: www.aaaai.org 2. Food Allergy Research and Education (FARE): foodallergy.org 3. Mothers of Asthmatics: http://www.asthmacommunitynetwork.org 4. American College of Allergy, Asthma, and Immunology: www.acaai.org

## 2016-04-22 NOTE — Progress Notes (Signed)
FOLLOW UP  Date of Service/Encounter:  04/22/16   Assessment:   Allergic rhinoconjunctivitis    Plan/Recommendations:   1. Allergic rhinoconjunctivitis - We will send you the two separate nose sprays: fluticasone and azelastine - This will take the place of Dysmista. - Start nasal saline rinses twice daily to keep the nose moisturized. - NeilMed bottle provided.  - Consider allergy shots for further control if there is no improvement. - Information on allergen immunotherapy provided.   2. Return in about 6 months (around 10/21/2016).     Subjective:   Philip Mcneil is a 65 y.o. male presenting today for follow up of  Chief Complaint  Patient presents with  . Follow-up    C/O HA, frequent nose bleeds.   Philip Mcneil has a history of the following: There are no active problems to display for this patient.   History obtained from: chart review and patient.  Philip Mcneil was referred by [name], MD.     Philip Mcneil is a 65 y.o. male presenting for a follow up visit. Philip Mcneil was last seen in June 2017 by Dr. Ishmael Holter, who has since left the practice. At that time, he was continued on all of his medications: Singulair 10mg  daily, Dymista one spray per nostril twice daily, and nasal saline rinses. He has a history of ARC and felt markedly improved following starting of the Dymista after the first visit. Testing at the first first was positive to mold mix #1 and #2.   Since the last visit, he has not done well. He changed to Medicare which does not cover the [place]. He has changed to fluticasone and azelastine separately. He continues to have congestion and bloody noses. He is not using nasal saline sprays. He does take antihistamines but they do not seem to help (Allegra-D, Zyrtec). He does take the montelukast every day which he does feel somewhat improved on. He spends a lot of time outdoors and has felt that his symptoms have worsened since the fall started. He is a  self proclaimed clean freak and does not know any place in his town home where there is mold.   Philip Mcneil did go to a "wellness doctor" who felt that he had a "large concentration of mold inside [his] body". She recommended going on a diet and taking a lot of vitamins. This was not covered by his insurance and he has ended up spending quite a bit of money on this. He has HEPA filters at home as well as charcoal cleaners and salt blocks. He is retired form [employer] and spends lots of time outdoors.   Otherwise, there have been no changes to his past medical history, surgical history, family history, or social history.    Review of Systems: a 14-point review of systems is pertinent for what is mentioned in HPI.  Otherwise, all other systems were negative. Constitutional: negative other than that listed in the HPI Eyes: negative other than that listed in the HPI Ears, nose, mouth, throat, and face: negative other than that listed in the HPI Respiratory: negative other than that listed in the HPI Cardiovascular: negative other than that listed in the HPI Gastrointestinal: negative other than that listed in the HPI Genitourinary: negative other than that listed in the HPI Integument: negative other than that listed in the HPI Hematologic: negative other than that listed in the HPI Musculoskeletal: negative other than that listed in the HPI Neurological: negative other than that  listed in the HPI Allergy/Immunologic: negative other than that listed in the HPI    Objective:   Blood pressure 130/82, pulse 88, temperature 98.1 F (36.7 C), temperature source Oral, height 5\' 6"  (1.676 m), weight 206 lb 6.4 oz (93.6 kg), SpO2 95 %. Body mass index is 33.31 kg/m.   Physical Exam:  General: Alert, interactive, in no acute distress. Cooperative with the exam. HEENT: TMs pearly gray, turbinates edematous with thick discharge, post-pharynx erythematous. There are multiple dried eschars within  the bilateral nares.  Neck: Supple without thyromegaly. Lungs: Clear to auscultation without wheezing, rhonchi or rales. No increased work of breathing. CV: Normal S1/S2, no murmurs. Capillary refill <2 seconds.  Abdomen: Nondistended, nontender. No guarding or rebound tenderness. Bowel sounds present in all fields and hyperactive  Skin: Warm and dry, without lesions or rashes. Extremities:  No clubbing, cyanosis or edema. Neuro:   Grossly intact.  Diagnostic studies: None    Philip Marvel, MD [facility]

## 2016-05-26 DIAGNOSIS — E782 Mixed hyperlipidemia: Secondary | ICD-10-CM | POA: Diagnosis not present

## 2016-05-26 DIAGNOSIS — K210 Gastro-esophageal reflux disease with esophagitis: Secondary | ICD-10-CM | POA: Diagnosis not present

## 2016-05-26 DIAGNOSIS — R7309 Other abnormal glucose: Secondary | ICD-10-CM | POA: Diagnosis not present

## 2016-05-28 DIAGNOSIS — F411 Generalized anxiety disorder: Secondary | ICD-10-CM | POA: Diagnosis not present

## 2016-05-28 DIAGNOSIS — M2550 Pain in unspecified joint: Secondary | ICD-10-CM | POA: Diagnosis not present

## 2016-05-28 DIAGNOSIS — I10 Essential (primary) hypertension: Secondary | ICD-10-CM | POA: Diagnosis not present

## 2016-05-28 DIAGNOSIS — F331 Major depressive disorder, recurrent, moderate: Secondary | ICD-10-CM | POA: Diagnosis not present

## 2016-05-28 DIAGNOSIS — Z6831 Body mass index (BMI) 31.0-31.9, adult: Secondary | ICD-10-CM | POA: Diagnosis not present

## 2016-05-28 DIAGNOSIS — K210 Gastro-esophageal reflux disease with esophagitis: Secondary | ICD-10-CM | POA: Diagnosis not present

## 2016-07-01 DIAGNOSIS — M150 Primary generalized (osteo)arthritis: Secondary | ICD-10-CM | POA: Diagnosis not present

## 2016-07-01 DIAGNOSIS — Z6831 Body mass index (BMI) 31.0-31.9, adult: Secondary | ICD-10-CM | POA: Diagnosis not present

## 2016-07-01 DIAGNOSIS — E669 Obesity, unspecified: Secondary | ICD-10-CM | POA: Diagnosis not present

## 2016-07-01 DIAGNOSIS — R768 Other specified abnormal immunological findings in serum: Secondary | ICD-10-CM | POA: Diagnosis not present

## 2016-07-28 DIAGNOSIS — I10 Essential (primary) hypertension: Secondary | ICD-10-CM | POA: Diagnosis not present

## 2016-07-28 DIAGNOSIS — E782 Mixed hyperlipidemia: Secondary | ICD-10-CM | POA: Diagnosis not present

## 2016-07-28 DIAGNOSIS — K210 Gastro-esophageal reflux disease with esophagitis: Secondary | ICD-10-CM | POA: Diagnosis not present

## 2016-07-28 DIAGNOSIS — R7309 Other abnormal glucose: Secondary | ICD-10-CM | POA: Diagnosis not present

## 2016-07-30 DIAGNOSIS — S8002XA Contusion of left knee, initial encounter: Secondary | ICD-10-CM | POA: Diagnosis not present

## 2016-07-30 DIAGNOSIS — F411 Generalized anxiety disorder: Secondary | ICD-10-CM | POA: Diagnosis not present

## 2016-07-30 DIAGNOSIS — Z6831 Body mass index (BMI) 31.0-31.9, adult: Secondary | ICD-10-CM | POA: Diagnosis not present

## 2016-07-30 DIAGNOSIS — E782 Mixed hyperlipidemia: Secondary | ICD-10-CM | POA: Diagnosis not present

## 2016-07-30 DIAGNOSIS — F331 Major depressive disorder, recurrent, moderate: Secondary | ICD-10-CM | POA: Diagnosis not present

## 2016-07-30 DIAGNOSIS — K210 Gastro-esophageal reflux disease with esophagitis: Secondary | ICD-10-CM | POA: Diagnosis not present

## 2016-07-30 DIAGNOSIS — I10 Essential (primary) hypertension: Secondary | ICD-10-CM | POA: Diagnosis not present

## 2016-09-15 DIAGNOSIS — Z85828 Personal history of other malignant neoplasm of skin: Secondary | ICD-10-CM | POA: Diagnosis not present

## 2016-09-15 DIAGNOSIS — L219 Seborrheic dermatitis, unspecified: Secondary | ICD-10-CM | POA: Diagnosis not present

## 2016-09-15 DIAGNOSIS — L570 Actinic keratosis: Secondary | ICD-10-CM | POA: Diagnosis not present

## 2016-10-21 ENCOUNTER — Ambulatory Visit: Payer: Medicare Other | Admitting: Allergy & Immunology

## 2016-11-20 DIAGNOSIS — E7800 Pure hypercholesterolemia, unspecified: Secondary | ICD-10-CM | POA: Diagnosis not present

## 2016-11-20 DIAGNOSIS — I10 Essential (primary) hypertension: Secondary | ICD-10-CM | POA: Diagnosis not present

## 2016-11-20 DIAGNOSIS — E782 Mixed hyperlipidemia: Secondary | ICD-10-CM | POA: Diagnosis not present

## 2016-11-25 DIAGNOSIS — S8002XA Contusion of left knee, initial encounter: Secondary | ICD-10-CM | POA: Diagnosis not present

## 2016-11-25 DIAGNOSIS — Z6831 Body mass index (BMI) 31.0-31.9, adult: Secondary | ICD-10-CM | POA: Diagnosis not present

## 2016-11-25 DIAGNOSIS — Z1389 Encounter for screening for other disorder: Secondary | ICD-10-CM | POA: Diagnosis not present

## 2016-11-25 DIAGNOSIS — F411 Generalized anxiety disorder: Secondary | ICD-10-CM | POA: Diagnosis not present

## 2016-11-25 DIAGNOSIS — I10 Essential (primary) hypertension: Secondary | ICD-10-CM | POA: Diagnosis not present

## 2016-11-25 DIAGNOSIS — K210 Gastro-esophageal reflux disease with esophagitis: Secondary | ICD-10-CM | POA: Diagnosis not present

## 2016-11-25 DIAGNOSIS — F331 Major depressive disorder, recurrent, moderate: Secondary | ICD-10-CM | POA: Diagnosis not present

## 2016-11-25 DIAGNOSIS — E782 Mixed hyperlipidemia: Secondary | ICD-10-CM | POA: Diagnosis not present

## 2017-02-13 DIAGNOSIS — Z23 Encounter for immunization: Secondary | ICD-10-CM | POA: Diagnosis not present

## 2017-03-18 DIAGNOSIS — L570 Actinic keratosis: Secondary | ICD-10-CM | POA: Diagnosis not present

## 2017-03-18 DIAGNOSIS — L219 Seborrheic dermatitis, unspecified: Secondary | ICD-10-CM | POA: Diagnosis not present

## 2017-03-18 DIAGNOSIS — Z85828 Personal history of other malignant neoplasm of skin: Secondary | ICD-10-CM | POA: Diagnosis not present

## 2017-04-29 DIAGNOSIS — K210 Gastro-esophageal reflux disease with esophagitis: Secondary | ICD-10-CM | POA: Diagnosis not present

## 2017-04-29 DIAGNOSIS — J069 Acute upper respiratory infection, unspecified: Secondary | ICD-10-CM | POA: Diagnosis not present

## 2017-04-29 DIAGNOSIS — Z6832 Body mass index (BMI) 32.0-32.9, adult: Secondary | ICD-10-CM | POA: Diagnosis not present

## 2017-04-30 ENCOUNTER — Ambulatory Visit (INDEPENDENT_AMBULATORY_CARE_PROVIDER_SITE_OTHER): Payer: Medicare Other | Admitting: Cardiovascular Disease

## 2017-04-30 ENCOUNTER — Encounter: Payer: Self-pay | Admitting: *Deleted

## 2017-04-30 ENCOUNTER — Encounter: Payer: Self-pay | Admitting: Cardiovascular Disease

## 2017-04-30 VITALS — BP 180/110 | HR 102 | Ht 69.0 in | Wt 215.0 lb

## 2017-04-30 DIAGNOSIS — R030 Elevated blood-pressure reading, without diagnosis of hypertension: Secondary | ICD-10-CM

## 2017-04-30 DIAGNOSIS — I10 Essential (primary) hypertension: Secondary | ICD-10-CM | POA: Diagnosis not present

## 2017-04-30 DIAGNOSIS — R0789 Other chest pain: Secondary | ICD-10-CM | POA: Diagnosis not present

## 2017-04-30 DIAGNOSIS — E785 Hyperlipidemia, unspecified: Secondary | ICD-10-CM | POA: Diagnosis not present

## 2017-04-30 DIAGNOSIS — R067 Sneezing: Secondary | ICD-10-CM | POA: Diagnosis not present

## 2017-04-30 DIAGNOSIS — R197 Diarrhea, unspecified: Secondary | ICD-10-CM | POA: Diagnosis not present

## 2017-04-30 DIAGNOSIS — E7800 Pure hypercholesterolemia, unspecified: Secondary | ICD-10-CM

## 2017-04-30 DIAGNOSIS — M1990 Unspecified osteoarthritis, unspecified site: Secondary | ICD-10-CM | POA: Diagnosis not present

## 2017-04-30 DIAGNOSIS — K219 Gastro-esophageal reflux disease without esophagitis: Secondary | ICD-10-CM | POA: Diagnosis not present

## 2017-04-30 DIAGNOSIS — J029 Acute pharyngitis, unspecified: Secondary | ICD-10-CM | POA: Diagnosis not present

## 2017-04-30 DIAGNOSIS — R079 Chest pain, unspecified: Secondary | ICD-10-CM

## 2017-04-30 DIAGNOSIS — R05 Cough: Secondary | ICD-10-CM | POA: Diagnosis not present

## 2017-04-30 DIAGNOSIS — Z79899 Other long term (current) drug therapy: Secondary | ICD-10-CM | POA: Diagnosis not present

## 2017-04-30 DIAGNOSIS — Z8546 Personal history of malignant neoplasm of prostate: Secondary | ICD-10-CM | POA: Diagnosis not present

## 2017-04-30 NOTE — Progress Notes (Signed)
CARDIOLOGY CONSULT NOTE  Patient ID: Philip Mcneil MRN: [id number] DOB/AGE: [birth year] 66 y.o.  Admit date: (Not on file) Primary Physician: Philip Hilding, MD Referring Physician: Dr. Quintin Alto  Reason for Consultation: Chest pain  HPI: Philip Mcneil is a 66 y.o. male who is being seen today for the evaluation of chest pain at the request of Sasser, Silvestre Moment, MD.   Past medical history includes hyperlipidemia and GERD.  I personally reviewed an ECG performed on 04/29/17 which demonstrated sinus tachycardia, 112 bpm.  I personally reviewed labs performed on 11/20/16: BUN 9, creatinine 0.97, sodium 144, potassium 4.2, AST 18, ALT 16, total cholesterol 195, triglycerides 67, HDL 60, LDL 122.  He said he has had an upper respiratory tract infection which started at the end of last week.  He has been sneezing and coughing and taking a lot of Tylenol and multiple over-the-counter medications to treat this.  Blood pressure is markedly elevated in our office which he attributes to being anxious and stressed.  It is 180/110.  He reports it was 116/70 yesterday.  He does not carry a diagnosis of hypertension.  He said he was hospitalized at [facility] in the past few years for chest pain and underwent a stress test and echocardiogram.  He denies a history of myocardial infarction.  He lives by himself.  He denies exertional chest pain.  He said he walks quite a bit on a daily basis and has been shoveling snow without exertional chest pain and shortness of breath.  He said he has a constant left-sided chest soreness alleviated with belching.  He takes Protonix.  He denies orthopnea, leg swelling, paroxysmal nocturnal dyspnea, palpitations, and syncope.      Allergies  Allergen Reactions  . Percocet [Oxycodone-Acetaminophen]     Itchy/jumpy     Current Outpatient Medications  Medication Sig Dispense Refill  . ALPRAZolam (XANAX) 0.5 MG tablet Take 0.5 mg by mouth 3 (three)  times daily as needed. for anxiety  0  . Azelastine HCl 0.15 % SOLN Place 2 sprays into both nostrils 2 (two) times daily. 30 mL 5  . Azelastine-Fluticasone 137-50 MCG/ACT SUSP Place 1 spray into the nose 2 (two) times daily. 1 Bottle 5  . cetirizine (ZYRTEC) 10 MG tablet Take 10 mg by mouth daily.    . citalopram (CELEXA) 20 MG tablet     . fluticasone (FLONASE) 50 MCG/ACT nasal spray Place 2 sprays into both nostrils daily. 1 g 5  . ketoconazole (NIZORAL) 2 % cream Apply 1 application topically daily. Reported on 10/23/2015    . montelukast (SINGULAIR) 10 MG tablet Take 10 mg by mouth daily as needed.    . pantoprazole (PROTONIX) 40 MG tablet Take 40 mg by mouth daily.    Marland Kitchen testosterone cypionate (DEPOTESTOSTERONE CYPIONATE) 200 MG/ML injection INJECT 0.5 ML IM WEEKLY AS DIRECTED.  1   No current facility-administered medications for this visit.     Past Medical History:  Diagnosis Date  . Cancer ([place])   . GERD (gastroesophageal reflux disease)     Past Surgical History:  Procedure Laterality Date  . BACK SURGERY     x2  . CERVICAL LAMINECTOMY    . INSERTION OF MESH N/A 11/23/2013   Procedure: INSERTION OF MESH;  Surgeon: Philip So, MD;  Location: [facility];  Service: General;  Laterality: N/A;  . KNEE ARTHROSCOPY Left   . ORIF FOOT FRACTURE Right   . PROSTATECTOMY  [place]  . ROTATOR CUFF REPAIR Right   . THUMB AMPUTATION Left   . UMBILICAL HERNIA REPAIR N/A 11/23/2013   Procedure: UMBILICAL HERNIORRHAPHY;  Surgeon: Philip So, MD;  Location: [facility];  Service: General;  Laterality: N/A;    Social History   Socioeconomic History  . Marital status: Married    Spouse name: Not on file  . Number of children: Not on file  . Years of education: Not on file  . Highest education level: Not on file  Social Needs  . Financial resource strain: Not on file  . Food insecurity - worry: Not on file  . Food insecurity - inability: Not on file  . Transportation needs -  medical: Not on file  . Transportation needs - non-medical: Not on file  Occupational History  . Not on file  Tobacco Use  . Smoking status: Former Smoker    Packs/day: 0.25    Years: 20.00    Pack years: 5.00    Types: Cigarettes    Last attempt to quit: 11/18/2003    Years since quitting: 13.4  . Smokeless tobacco: Never Used  Substance and Sexual Activity  . Alcohol use: No    Alcohol/week: 0.0 oz  . Drug use: No  . Sexual activity: Yes    Birth control/protection: None  Other Topics Concern  . Not on file  Social History Narrative  . Not on file     No family history of premature CAD in 1st degree relatives.  Current Meds  Medication Sig  . ALPRAZolam (XANAX) 0.5 MG tablet Take 0.5 mg by mouth 3 (three) times daily as needed. for anxiety  . Azelastine HCl 0.15 % SOLN Place 2 sprays into both nostrils 2 (two) times daily.  . Azelastine-Fluticasone 137-50 MCG/ACT SUSP Place 1 spray into the nose 2 (two) times daily.  . cetirizine (ZYRTEC) 10 MG tablet Take 10 mg by mouth daily.  . citalopram (CELEXA) 20 MG tablet   . fluticasone (FLONASE) 50 MCG/ACT nasal spray Place 2 sprays into both nostrils daily.  Marland Kitchen ketoconazole (NIZORAL) 2 % cream Apply 1 application topically daily. Reported on 10/23/2015  . montelukast (SINGULAIR) 10 MG tablet Take 10 mg by mouth daily as needed.  . pantoprazole (PROTONIX) 40 MG tablet Take 40 mg by mouth daily.  Marland Kitchen testosterone cypionate (DEPOTESTOSTERONE CYPIONATE) 200 MG/ML injection INJECT 0.5 ML IM WEEKLY AS DIRECTED.      Review of systems complete and found to be negative unless listed above in HPI    Physical exam Blood pressure (!) 180/110, pulse (!) 102, height 5\' 9"  (1.753 m), weight 215 lb (97.5 kg), SpO2 98 %. General: NAD Neck: No JVD, no thyromegaly or thyroid nodule.  Lungs: Clear to auscultation bilaterally with normal respiratory effort. CV: Nondisplaced PMI.  Mildly tachycardic, regular rhythm, normal S1/S2, no S3/S4, no  murmur.  No peripheral edema.  No carotid bruit.    Abdomen: Soft, nontender, no distention.  Skin: Intact without lesions or rashes.  Neurologic: Alert and oriented x 3.  Psych: Normal affect. Extremities: No clubbing or cyanosis.  HEENT: Normal.   ECG: Most recent ECG reviewed.   Labs: Lab Results  Component Value Date/Time   K 4.2 11/17/2013 10:40 AM   BUN 8 11/17/2013 10:40 AM   CREATININE 0.74 11/17/2013 10:40 AM   HGB 15.1 11/17/2013 10:40 AM     Lipids: No results found for: LDLCALC, LDLDIRECT, CHOL, TRIG, HDL      ASSESSMENT AND  PLAN:  1.  Chest pain: He denies exertional symptoms.  It is associated with belching.  I will request records from [facility] with regards to prior echocardiogram and stress test.  He has an upper respiratory tract infection.  I will reevaluate him in 6 weeks to see if further noninvasive cardiac testing is indicated.  2.  Elevated blood pressure: He attributes this to anxiety and stress related to this appointment.  He said blood pressure was 116/70 yesterday.  He does not carry a diagnosis of hypertension.  I will monitor this.  3.  Hyperlipidemia: Continue statin therapy.     Disposition: Follow up in 6 weeks.   Signed: Kate Sable, M.D., F.A.C.C.  04/30/2017, 3:34 PM

## 2017-04-30 NOTE — Patient Instructions (Addendum)
Medication Instructions:  Continue all current medications.  Labwork: none  Testing/Procedures: none  Follow-Up: 6 weeks   Any Other Special Instructions Will Be Listed Below (If Applicable).  If you need a refill on your cardiac medications before your next appointment, please call your pharmacy.  

## 2017-05-01 ENCOUNTER — Encounter: Payer: Self-pay | Admitting: *Deleted

## 2017-05-01 ENCOUNTER — Telehealth: Payer: Self-pay | Admitting: Cardiovascular Disease

## 2017-05-01 NOTE — Telephone Encounter (Signed)
BP was running really high.  Did all kinds of test and everything came back normal.  Did give prescription for HCTZ.  Did also discuss with his pmd & was told to go ahead and get the new medication & monitor till Wednesday.  Was instructed to call him back & he will decide what to do from that point moving forward.  Readings after taking new medications was looking a lot better.  Will make appointment on Wednesday after seeing what BP looks like with his pmd.  Notes requested from [facility].

## 2017-05-01 NOTE — Telephone Encounter (Signed)
Philip Mcneil called stating that he went to [facility] last night with elevated blood pressure.  States that they did test on him and sent Him home.  He is concerned that his BP is elevated.  Please call [phone].

## 2017-05-22 DIAGNOSIS — R5383 Other fatigue: Secondary | ICD-10-CM | POA: Diagnosis not present

## 2017-05-22 DIAGNOSIS — Z6831 Body mass index (BMI) 31.0-31.9, adult: Secondary | ICD-10-CM | POA: Diagnosis not present

## 2017-05-22 DIAGNOSIS — I10 Essential (primary) hypertension: Secondary | ICD-10-CM | POA: Diagnosis not present

## 2017-05-28 DIAGNOSIS — F411 Generalized anxiety disorder: Secondary | ICD-10-CM | POA: Diagnosis not present

## 2017-05-28 DIAGNOSIS — K210 Gastro-esophageal reflux disease with esophagitis: Secondary | ICD-10-CM | POA: Diagnosis not present

## 2017-05-28 DIAGNOSIS — Z6831 Body mass index (BMI) 31.0-31.9, adult: Secondary | ICD-10-CM | POA: Diagnosis not present

## 2017-05-28 DIAGNOSIS — E782 Mixed hyperlipidemia: Secondary | ICD-10-CM | POA: Diagnosis not present

## 2017-05-28 DIAGNOSIS — I10 Essential (primary) hypertension: Secondary | ICD-10-CM | POA: Diagnosis not present

## 2017-05-28 DIAGNOSIS — F331 Major depressive disorder, recurrent, moderate: Secondary | ICD-10-CM | POA: Diagnosis not present

## 2017-06-15 ENCOUNTER — Encounter: Payer: Self-pay | Admitting: Cardiovascular Disease

## 2017-06-15 ENCOUNTER — Other Ambulatory Visit: Payer: Self-pay

## 2017-06-15 ENCOUNTER — Ambulatory Visit (INDEPENDENT_AMBULATORY_CARE_PROVIDER_SITE_OTHER): Payer: Medicare Other | Admitting: Cardiovascular Disease

## 2017-06-15 VITALS — BP 120/80 | HR 101 | Ht 69.0 in | Wt 209.0 lb

## 2017-06-15 DIAGNOSIS — R079 Chest pain, unspecified: Secondary | ICD-10-CM | POA: Diagnosis not present

## 2017-06-15 DIAGNOSIS — I10 Essential (primary) hypertension: Secondary | ICD-10-CM | POA: Diagnosis not present

## 2017-06-15 DIAGNOSIS — E7800 Pure hypercholesterolemia, unspecified: Secondary | ICD-10-CM

## 2017-06-15 NOTE — Patient Instructions (Signed)
Medication Instructions:  Continue all current medications.  Labwork: none  Testing/Procedures: none  Follow-Up: As needed.    Any Other Special Instructions Will Be Listed Below (If Applicable).  If you need a refill on your cardiac medications before your next appointment, please call your pharmacy.  

## 2017-06-15 NOTE — Progress Notes (Signed)
SUBJECTIVE: The patient returns for follow-up of chest pain.  He has had no further issues with chest pain.  He told me that after he left my office after his initial appointment, his blood pressure gradually began to elevate to the point it was 212/119.  He went to the emergency room at [facility].  He is now on lisinopril 20 mg daily.  I reviewed his blood pressure log which demonstrates normotensive readings.  He denies shortness of breath, leg swelling, orthopnea, and palpitations.  He said he had a series of blood tests from his PCP that they were normal.    Review of Systems: As per "subjective", otherwise negative.  Allergies  Allergen Reactions  . Percocet [Oxycodone-Acetaminophen]     Itchy/jumpy     Current Outpatient Medications  Medication Sig Dispense Refill  . ALPRAZolam (XANAX) 0.5 MG tablet Take 0.5 mg by mouth 3 (three) times daily as needed. for anxiety  0  . Azelastine HCl 0.15 % SOLN Place 2 sprays into both nostrils 2 (two) times daily. 30 mL 5  . Azelastine-Fluticasone 137-50 MCG/ACT SUSP Place 1 spray into the nose 2 (two) times daily. 1 Bottle 5  . cetirizine (ZYRTEC) 10 MG tablet Take 10 mg by mouth daily.    . citalopram (CELEXA) 20 MG tablet     . fluticasone (FLONASE) 50 MCG/ACT nasal spray Place 2 sprays into both nostrils daily. 1 g 5  . ketoconazole (NIZORAL) 2 % cream Apply 1 application topically daily. Reported on 10/23/2015    . lisinopril (PRINIVIL,ZESTRIL) 20 MG tablet Take 20 mg by mouth daily.    . montelukast (SINGULAIR) 10 MG tablet Take 10 mg by mouth daily as needed.    . pantoprazole (PROTONIX) 40 MG tablet Take 40 mg by mouth daily.    Marland Kitchen testosterone cypionate (DEPOTESTOSTERONE CYPIONATE) 200 MG/ML injection INJECT 0.5 ML IM WEEKLY AS DIRECTED.  1   No current facility-administered medications for this visit.     Past Medical History:  Diagnosis Date  . Cancer ([place])   . GERD (gastroesophageal reflux disease)     Past  Surgical History:  Procedure Laterality Date  . BACK SURGERY     x2  . CERVICAL LAMINECTOMY    . INSERTION OF MESH N/A 11/23/2013   Procedure: INSERTION OF MESH;  Surgeon: Jamesetta So, MD;  Location: [facility];  Service: General;  Laterality: N/A;  . KNEE ARTHROSCOPY Left   . ORIF FOOT FRACTURE Right   . PROSTATECTOMY     [place]  . ROTATOR CUFF REPAIR Right   . THUMB AMPUTATION Left   . UMBILICAL HERNIA REPAIR N/A 11/23/2013   Procedure: UMBILICAL HERNIORRHAPHY;  Surgeon: Jamesetta So, MD;  Location: [facility];  Service: General;  Laterality: N/A;    Social History   Socioeconomic History  . Marital status: Married    Spouse name: Not on file  . Number of children: Not on file  . Years of education: Not on file  . Highest education level: Not on file  Social Needs  . Financial resource strain: Not on file  . Food insecurity - worry: Not on file  . Food insecurity - inability: Not on file  . Transportation needs - medical: Not on file  . Transportation needs - non-medical: Not on file  Occupational History  . Not on file  Tobacco Use  . Smoking status: Former Smoker    Packs/day: 0.25    Years: 20.00  Pack years: 5.00    Types: Cigarettes    Last attempt to quit: 11/18/2003    Years since quitting: 13.5  . Smokeless tobacco: Never Used  Substance and Sexual Activity  . Alcohol use: No    Alcohol/week: 0.0 oz  . Drug use: No  . Sexual activity: Yes    Birth control/protection: None  Other Topics Concern  . Not on file  Social History Narrative  . Not on file     Vitals:   06/15/17 1447  BP: 120/80  Pulse: (!) 101  SpO2: 97%  Weight: 209 lb (94.8 kg)  Height: 5\' 9"  (1.753 m)    Wt Readings from Last 3 Encounters:  06/15/17 209 lb (94.8 kg)  04/30/17 215 lb (97.5 kg)  04/22/16 206 lb 6.4 oz (93.6 kg)     PHYSICAL EXAM General: NAD HEENT: Normal. Neck: No JVD, no thyromegaly. Lungs: Clear to auscultation bilaterally with normal respiratory  effort. CV: Mildly tachycardic, regular rhythm, normal S1/S2, no S3/S4, no murmur. No pretibial or periankle edema. Abdomen: Soft, nontender, no distention.  Neurologic: Alert and oriented.  Psych: Normal affect. Skin: Normal. Musculoskeletal: No gross deformities.    ECG: Most recent ECG reviewed.   Labs: Lab Results  Component Value Date/Time   K 4.2 11/17/2013 10:40 AM   BUN 8 11/17/2013 10:40 AM   CREATININE 0.74 11/17/2013 10:40 AM   HGB 15.1 11/17/2013 10:40 AM     Lipids: No results found for: LDLCALC, LDLDIRECT, CHOL, TRIG, HDL     ASSESSMENT AND PLAN: 1.  Chest pain: No further symptoms with optimal blood pressure control.  Cardiac testing is not indicated at this time.  2.  Hypertension: Blood pressure currently controlled on lisinopril 20 mg daily.  Blood pressure log reviewed above.  3.  Hyperlipidemia: Currently takes a statin half tablet once per week.    Disposition: Follow up as needed   Kate Sable, M.D., F.A.C.C.

## 2017-08-17 DIAGNOSIS — J09X2 Influenza due to identified novel influenza A virus with other respiratory manifestations: Secondary | ICD-10-CM | POA: Diagnosis not present

## 2017-08-17 DIAGNOSIS — Z6830 Body mass index (BMI) 30.0-30.9, adult: Secondary | ICD-10-CM | POA: Diagnosis not present

## 2017-08-22 DIAGNOSIS — Z79899 Other long term (current) drug therapy: Secondary | ICD-10-CM | POA: Diagnosis not present

## 2017-08-22 DIAGNOSIS — H6692 Otitis media, unspecified, left ear: Secondary | ICD-10-CM | POA: Diagnosis not present

## 2017-08-22 DIAGNOSIS — K219 Gastro-esophageal reflux disease without esophagitis: Secondary | ICD-10-CM | POA: Diagnosis not present

## 2017-08-25 DIAGNOSIS — E782 Mixed hyperlipidemia: Secondary | ICD-10-CM | POA: Diagnosis not present

## 2017-08-25 DIAGNOSIS — E7800 Pure hypercholesterolemia, unspecified: Secondary | ICD-10-CM | POA: Diagnosis not present

## 2017-08-25 DIAGNOSIS — R7309 Other abnormal glucose: Secondary | ICD-10-CM | POA: Diagnosis not present

## 2017-08-25 DIAGNOSIS — I10 Essential (primary) hypertension: Secondary | ICD-10-CM | POA: Diagnosis not present

## 2017-08-25 DIAGNOSIS — K210 Gastro-esophageal reflux disease with esophagitis: Secondary | ICD-10-CM | POA: Diagnosis not present

## 2017-08-26 DIAGNOSIS — H6242 Otitis externa in other diseases classified elsewhere, left ear: Secondary | ICD-10-CM | POA: Diagnosis not present

## 2017-08-26 DIAGNOSIS — H66012 Acute suppurative otitis media with spontaneous rupture of ear drum, left ear: Secondary | ICD-10-CM | POA: Diagnosis not present

## 2017-08-26 DIAGNOSIS — Z6830 Body mass index (BMI) 30.0-30.9, adult: Secondary | ICD-10-CM | POA: Diagnosis not present

## 2017-08-27 DIAGNOSIS — H6692 Otitis media, unspecified, left ear: Secondary | ICD-10-CM | POA: Diagnosis not present

## 2017-08-27 DIAGNOSIS — Z87891 Personal history of nicotine dependence: Secondary | ICD-10-CM | POA: Diagnosis not present

## 2017-08-27 DIAGNOSIS — H9212 Otorrhea, left ear: Secondary | ICD-10-CM | POA: Diagnosis not present

## 2017-08-27 DIAGNOSIS — H722X2 Other marginal perforations of tympanic membrane, left ear: Secondary | ICD-10-CM | POA: Diagnosis not present

## 2017-09-03 DIAGNOSIS — H6692 Otitis media, unspecified, left ear: Secondary | ICD-10-CM | POA: Diagnosis not present

## 2017-09-03 DIAGNOSIS — H7292 Unspecified perforation of tympanic membrane, left ear: Secondary | ICD-10-CM | POA: Diagnosis not present

## 2017-09-03 DIAGNOSIS — H9212 Otorrhea, left ear: Secondary | ICD-10-CM | POA: Diagnosis not present

## 2017-09-08 DIAGNOSIS — H722X2 Other marginal perforations of tympanic membrane, left ear: Secondary | ICD-10-CM | POA: Diagnosis not present

## 2017-09-08 DIAGNOSIS — H9012 Conductive hearing loss, unilateral, left ear, with unrestricted hearing on the contralateral side: Secondary | ICD-10-CM | POA: Diagnosis not present

## 2017-09-08 DIAGNOSIS — J3489 Other specified disorders of nose and nasal sinuses: Secondary | ICD-10-CM | POA: Diagnosis not present

## 2017-09-08 DIAGNOSIS — H9212 Otorrhea, left ear: Secondary | ICD-10-CM | POA: Diagnosis not present

## 2017-09-08 DIAGNOSIS — Z87891 Personal history of nicotine dependence: Secondary | ICD-10-CM | POA: Diagnosis not present

## 2017-09-11 DIAGNOSIS — H9212 Otorrhea, left ear: Secondary | ICD-10-CM | POA: Diagnosis not present

## 2017-09-11 DIAGNOSIS — H7292 Unspecified perforation of tympanic membrane, left ear: Secondary | ICD-10-CM | POA: Diagnosis not present

## 2017-09-14 DIAGNOSIS — H6692 Otitis media, unspecified, left ear: Secondary | ICD-10-CM | POA: Diagnosis not present

## 2017-09-14 DIAGNOSIS — H9012 Conductive hearing loss, unilateral, left ear, with unrestricted hearing on the contralateral side: Secondary | ICD-10-CM | POA: Diagnosis not present

## 2017-09-14 DIAGNOSIS — H9212 Otorrhea, left ear: Secondary | ICD-10-CM | POA: Diagnosis not present

## 2017-09-14 DIAGNOSIS — H7292 Unspecified perforation of tympanic membrane, left ear: Secondary | ICD-10-CM | POA: Diagnosis not present

## 2017-09-17 ENCOUNTER — Other Ambulatory Visit: Payer: Self-pay | Admitting: Physician Assistant

## 2017-09-17 DIAGNOSIS — H6692 Otitis media, unspecified, left ear: Secondary | ICD-10-CM

## 2017-09-17 DIAGNOSIS — H7292 Unspecified perforation of tympanic membrane, left ear: Secondary | ICD-10-CM

## 2017-09-17 DIAGNOSIS — H9212 Otorrhea, left ear: Secondary | ICD-10-CM | POA: Diagnosis not present

## 2017-09-17 DIAGNOSIS — H9012 Conductive hearing loss, unilateral, left ear, with unrestricted hearing on the contralateral side: Secondary | ICD-10-CM

## 2017-09-22 ENCOUNTER — Ambulatory Visit
Admission: RE | Admit: 2017-09-22 | Discharge: 2017-09-22 | Disposition: A | Discharge status: Home / Self Care | Payer: Medicare Other | Source: Ambulatory Visit | Attending: Physician Assistant | Admitting: Physician Assistant

## 2017-09-22 DIAGNOSIS — H6692 Otitis media, unspecified, left ear: Secondary | ICD-10-CM

## 2017-09-22 DIAGNOSIS — H9212 Otorrhea, left ear: Secondary | ICD-10-CM

## 2017-09-22 DIAGNOSIS — H7292 Unspecified perforation of tympanic membrane, left ear: Secondary | ICD-10-CM

## 2017-09-22 DIAGNOSIS — H9012 Conductive hearing loss, unilateral, left ear, with unrestricted hearing on the contralateral side: Secondary | ICD-10-CM

## 2017-09-29 DIAGNOSIS — H7292 Unspecified perforation of tympanic membrane, left ear: Secondary | ICD-10-CM | POA: Diagnosis not present

## 2017-09-29 DIAGNOSIS — H9212 Otorrhea, left ear: Secondary | ICD-10-CM | POA: Diagnosis not present

## 2017-09-29 DIAGNOSIS — H9012 Conductive hearing loss, unilateral, left ear, with unrestricted hearing on the contralateral side: Secondary | ICD-10-CM | POA: Diagnosis not present

## 2017-10-08 DIAGNOSIS — H7292 Unspecified perforation of tympanic membrane, left ear: Secondary | ICD-10-CM | POA: Diagnosis not present

## 2017-10-08 DIAGNOSIS — H9012 Conductive hearing loss, unilateral, left ear, with unrestricted hearing on the contralateral side: Secondary | ICD-10-CM | POA: Diagnosis not present

## 2017-10-14 DIAGNOSIS — R0689 Other abnormalities of breathing: Secondary | ICD-10-CM | POA: Diagnosis not present

## 2017-10-14 DIAGNOSIS — D62 Acute posthemorrhagic anemia: Secondary | ICD-10-CM | POA: Diagnosis not present

## 2017-10-14 DIAGNOSIS — S8262XA Displaced fracture of lateral malleolus of left fibula, initial encounter for closed fracture: Secondary | ICD-10-CM | POA: Diagnosis not present

## 2017-10-14 DIAGNOSIS — S3993XA Unspecified injury of pelvis, initial encounter: Secondary | ICD-10-CM | POA: Diagnosis not present

## 2017-10-14 DIAGNOSIS — I959 Hypotension, unspecified: Secondary | ICD-10-CM | POA: Diagnosis not present

## 2017-10-14 DIAGNOSIS — R079 Chest pain, unspecified: Secondary | ICD-10-CM | POA: Diagnosis not present

## 2017-10-14 DIAGNOSIS — S299XXA Unspecified injury of thorax, initial encounter: Secondary | ICD-10-CM | POA: Diagnosis not present

## 2017-10-14 DIAGNOSIS — M542 Cervicalgia: Secondary | ICD-10-CM | POA: Diagnosis not present

## 2017-10-14 DIAGNOSIS — S72435A Nondisplaced fracture of medial condyle of left femur, initial encounter for closed fracture: Secondary | ICD-10-CM | POA: Diagnosis not present

## 2017-10-14 DIAGNOSIS — Y92410 Unspecified street and highway as the place of occurrence of the external cause: Secondary | ICD-10-CM | POA: Diagnosis not present

## 2017-10-14 DIAGNOSIS — Z8546 Personal history of malignant neoplasm of prostate: Secondary | ICD-10-CM | POA: Diagnosis not present

## 2017-10-14 DIAGNOSIS — H7292 Unspecified perforation of tympanic membrane, left ear: Secondary | ICD-10-CM | POA: Diagnosis not present

## 2017-10-14 DIAGNOSIS — Z87891 Personal history of nicotine dependence: Secondary | ICD-10-CM | POA: Diagnosis not present

## 2017-10-14 DIAGNOSIS — T07XXXA Unspecified multiple injuries, initial encounter: Secondary | ICD-10-CM | POA: Diagnosis not present

## 2017-10-14 DIAGNOSIS — Z041 Encounter for examination and observation following transport accident: Secondary | ICD-10-CM | POA: Diagnosis not present

## 2017-10-14 DIAGNOSIS — R55 Syncope and collapse: Secondary | ICD-10-CM | POA: Diagnosis not present

## 2017-10-14 DIAGNOSIS — S30811A Abrasion of abdominal wall, initial encounter: Secondary | ICD-10-CM | POA: Diagnosis not present

## 2017-10-14 DIAGNOSIS — Y998 Other external cause status: Secondary | ICD-10-CM | POA: Diagnosis not present

## 2017-10-14 DIAGNOSIS — E7800 Pure hypercholesterolemia, unspecified: Secondary | ICD-10-CM | POA: Diagnosis not present

## 2017-10-14 DIAGNOSIS — S2231XA Fracture of one rib, right side, initial encounter for closed fracture: Secondary | ICD-10-CM | POA: Diagnosis not present

## 2017-10-14 DIAGNOSIS — S82892D Other fracture of left lower leg, subsequent encounter for closed fracture with routine healing: Secondary | ICD-10-CM | POA: Diagnosis not present

## 2017-10-14 DIAGNOSIS — M25572 Pain in left ankle and joints of left foot: Secondary | ICD-10-CM | POA: Diagnosis not present

## 2017-10-14 DIAGNOSIS — S7292XA Unspecified fracture of left femur, initial encounter for closed fracture: Secondary | ICD-10-CM | POA: Diagnosis not present

## 2017-10-14 DIAGNOSIS — S7292XD Unspecified fracture of left femur, subsequent encounter for closed fracture with routine healing: Secondary | ICD-10-CM | POA: Diagnosis not present

## 2017-10-15 DIAGNOSIS — F419 Anxiety disorder, unspecified: Secondary | ICD-10-CM | POA: Diagnosis present

## 2017-10-15 DIAGNOSIS — S7292XA Unspecified fracture of left femur, initial encounter for closed fracture: Secondary | ICD-10-CM | POA: Diagnosis present

## 2017-10-15 DIAGNOSIS — E785 Hyperlipidemia, unspecified: Secondary | ICD-10-CM | POA: Diagnosis present

## 2017-10-15 DIAGNOSIS — D62 Acute posthemorrhagic anemia: Secondary | ICD-10-CM | POA: Diagnosis present

## 2017-10-15 DIAGNOSIS — R55 Syncope and collapse: Secondary | ICD-10-CM | POA: Diagnosis not present

## 2017-10-15 DIAGNOSIS — G8911 Acute pain due to trauma: Secondary | ICD-10-CM | POA: Diagnosis not present

## 2017-10-15 DIAGNOSIS — S82892A Other fracture of left lower leg, initial encounter for closed fracture: Secondary | ICD-10-CM | POA: Diagnosis not present

## 2017-10-15 DIAGNOSIS — Z87891 Personal history of nicotine dependence: Secondary | ICD-10-CM | POA: Diagnosis not present

## 2017-10-15 DIAGNOSIS — R001 Bradycardia, unspecified: Secondary | ICD-10-CM | POA: Diagnosis not present

## 2017-10-15 DIAGNOSIS — T1490XA Injury, unspecified, initial encounter: Secondary | ICD-10-CM | POA: Diagnosis not present

## 2017-10-15 DIAGNOSIS — R0689 Other abnormalities of breathing: Secondary | ICD-10-CM | POA: Diagnosis present

## 2017-10-15 DIAGNOSIS — S8262XA Displaced fracture of lateral malleolus of left fibula, initial encounter for closed fracture: Secondary | ICD-10-CM | POA: Diagnosis present

## 2017-10-15 DIAGNOSIS — K219 Gastro-esophageal reflux disease without esophagitis: Secondary | ICD-10-CM | POA: Diagnosis present

## 2017-10-15 DIAGNOSIS — S2231XA Fracture of one rib, right side, initial encounter for closed fracture: Secondary | ICD-10-CM | POA: Diagnosis present

## 2017-10-15 DIAGNOSIS — E7800 Pure hypercholesterolemia, unspecified: Secondary | ICD-10-CM | POA: Diagnosis present

## 2017-10-15 DIAGNOSIS — Z8546 Personal history of malignant neoplasm of prostate: Secondary | ICD-10-CM | POA: Diagnosis not present

## 2017-10-15 DIAGNOSIS — I10 Essential (primary) hypertension: Secondary | ICD-10-CM | POA: Diagnosis present

## 2017-10-15 DIAGNOSIS — S72435A Nondisplaced fracture of medial condyle of left femur, initial encounter for closed fracture: Secondary | ICD-10-CM | POA: Diagnosis not present

## 2017-10-15 DIAGNOSIS — I959 Hypotension, unspecified: Secondary | ICD-10-CM | POA: Diagnosis not present

## 2017-10-15 DIAGNOSIS — I499 Cardiac arrhythmia, unspecified: Secondary | ICD-10-CM | POA: Diagnosis not present

## 2017-10-20 DIAGNOSIS — H9012 Conductive hearing loss, unilateral, left ear, with unrestricted hearing on the contralateral side: Secondary | ICD-10-CM | POA: Diagnosis not present

## 2017-10-20 DIAGNOSIS — H7292 Unspecified perforation of tympanic membrane, left ear: Secondary | ICD-10-CM | POA: Diagnosis not present

## 2017-10-20 DIAGNOSIS — S82892D Other fracture of left lower leg, subsequent encounter for closed fracture with routine healing: Secondary | ICD-10-CM | POA: Diagnosis not present

## 2017-10-20 DIAGNOSIS — S2231XD Fracture of one rib, right side, subsequent encounter for fracture with routine healing: Secondary | ICD-10-CM | POA: Diagnosis not present

## 2017-10-20 DIAGNOSIS — I10 Essential (primary) hypertension: Secondary | ICD-10-CM | POA: Diagnosis not present

## 2017-10-20 DIAGNOSIS — S7292XD Unspecified fracture of left femur, subsequent encounter for closed fracture with routine healing: Secondary | ICD-10-CM | POA: Diagnosis not present

## 2017-10-28 DIAGNOSIS — R55 Syncope and collapse: Secondary | ICD-10-CM | POA: Diagnosis not present

## 2017-10-28 DIAGNOSIS — Z6839 Body mass index (BMI) 39.0-39.9, adult: Secondary | ICD-10-CM | POA: Diagnosis not present

## 2017-10-28 DIAGNOSIS — S93402A Sprain of unspecified ligament of left ankle, initial encounter: Secondary | ICD-10-CM | POA: Diagnosis not present

## 2017-10-28 DIAGNOSIS — I10 Essential (primary) hypertension: Secondary | ICD-10-CM | POA: Diagnosis not present

## 2017-10-28 DIAGNOSIS — S9001XA Contusion of right ankle, initial encounter: Secondary | ICD-10-CM | POA: Diagnosis not present

## 2017-10-28 DIAGNOSIS — S0922XD Traumatic rupture of left ear drum, subsequent encounter: Secondary | ICD-10-CM | POA: Diagnosis not present

## 2017-10-28 DIAGNOSIS — S9002XA Contusion of left ankle, initial encounter: Secondary | ICD-10-CM | POA: Diagnosis not present

## 2017-11-03 DIAGNOSIS — S72435D Nondisplaced fracture of medial condyle of left femur, subsequent encounter for closed fracture with routine healing: Secondary | ICD-10-CM | POA: Diagnosis not present

## 2017-11-03 DIAGNOSIS — M19072 Primary osteoarthritis, left ankle and foot: Secondary | ICD-10-CM | POA: Diagnosis not present

## 2017-11-03 DIAGNOSIS — M7732 Calcaneal spur, left foot: Secondary | ICD-10-CM | POA: Diagnosis not present

## 2017-11-03 DIAGNOSIS — M25462 Effusion, left knee: Secondary | ICD-10-CM | POA: Diagnosis not present

## 2017-11-03 DIAGNOSIS — S8262XD Displaced fracture of lateral malleolus of left fibula, subsequent encounter for closed fracture with routine healing: Secondary | ICD-10-CM | POA: Diagnosis not present

## 2017-11-03 DIAGNOSIS — S2241XD Multiple fractures of ribs, right side, subsequent encounter for fracture with routine healing: Secondary | ICD-10-CM | POA: Diagnosis not present

## 2017-11-03 DIAGNOSIS — M1712 Unilateral primary osteoarthritis, left knee: Secondary | ICD-10-CM | POA: Diagnosis not present

## 2017-11-03 DIAGNOSIS — M11262 Other chondrocalcinosis, left knee: Secondary | ICD-10-CM | POA: Diagnosis not present

## 2017-11-03 DIAGNOSIS — S82892D Other fracture of left lower leg, subsequent encounter for closed fracture with routine healing: Secondary | ICD-10-CM | POA: Diagnosis not present

## 2017-11-03 DIAGNOSIS — M61562 Other ossification of muscle, left lower leg: Secondary | ICD-10-CM | POA: Diagnosis not present

## 2017-12-10 DIAGNOSIS — H7292 Unspecified perforation of tympanic membrane, left ear: Secondary | ICD-10-CM | POA: Diagnosis not present

## 2017-12-10 DIAGNOSIS — H9012 Conductive hearing loss, unilateral, left ear, with unrestricted hearing on the contralateral side: Secondary | ICD-10-CM | POA: Diagnosis not present

## 2017-12-17 ENCOUNTER — Other Ambulatory Visit: Payer: Self-pay

## 2018-01-26 DIAGNOSIS — H90A21 Sensorineural hearing loss, unilateral, right ear, with restricted hearing on the contralateral side: Secondary | ICD-10-CM | POA: Diagnosis not present

## 2018-01-26 DIAGNOSIS — H90A32 Mixed conductive and sensorineural hearing loss, unilateral, left ear with restricted hearing on the contralateral side: Secondary | ICD-10-CM | POA: Diagnosis not present

## 2018-01-26 DIAGNOSIS — H7292 Unspecified perforation of tympanic membrane, left ear: Secondary | ICD-10-CM | POA: Diagnosis not present

## 2018-01-26 DIAGNOSIS — H9012 Conductive hearing loss, unilateral, left ear, with unrestricted hearing on the contralateral side: Secondary | ICD-10-CM | POA: Diagnosis not present

## 2018-01-27 DIAGNOSIS — Z23 Encounter for immunization: Secondary | ICD-10-CM | POA: Diagnosis not present

## 2018-02-11 ENCOUNTER — Encounter (HOSPITAL_BASED_OUTPATIENT_CLINIC_OR_DEPARTMENT_OTHER): Payer: Self-pay | Admitting: *Deleted

## 2018-02-11 ENCOUNTER — Other Ambulatory Visit: Payer: Self-pay

## 2018-02-18 NOTE — H&P (Addendum)
Return visit. No more drainage or pain. Still having trouble hearing. On exam, the ear canal is clean and dry. There is a dry clean central perforation, proximally 30% in the posterior inferior quadrant. We discussed the possible need for tympanoplasty surgery to reconstruct the drum and to assist with his hearing. He will think about it. We will do audiometric evaluation today on the way out.   HPI:   Chief Complaint  Patient presents with  . Ear Drainage   Philip Mcneil is a 67 y.o. male who presents as a new patient for left ear pain and drainage. Duration of six days. This is a new problem. Diagnosed with the flu last week. Had a lot of coughing. Developed acute onset left sided ear pain followed by drainage. Prescribed Ofloxin drops and he completed a course of Tamiflu. Also prescribed an Augmentin, 875mg  BID for 10 days; started yesterday. He is taking Vicodin for the pain which is not really helping. Hearing diminished in left ear.   No fever, sore throat, tinnitus or vertigo. He has not noticed a rash on the skin or new spots in his mouth.   No prior history of recurrent ear infections or ear surgeries. Noise exposures include machinery (factory setting).  No PMH of diabetes.  Former smoker.  PMH/Meds/All/SocHx/FamHx/ROS:   Past Medical History:  Diagnosis Date  . High cholesterol  . Hypertension  . Prostate cancer [facility])   Past Surgical History:  Procedure Laterality Date  . BACK SURGERY  . HERNIA REPAIR  . KNEE SURGERY  . SHOULDER SURGERY   No family history of bleeding disorders, wound healing problems or difficulty with anesthesia.   Social History   Social History  . Marital status: Married  Spouse name: N/A  . Number of children: N/A  . Years of education: N/A   Occupational History  . Not on file.   Social History Main Topics  . Smoking status: Former Research scientist (life sciences)  . Smokeless tobacco: Never Used  . Alcohol use Not on file  . Drug use: Unknown  . Sexual  activity: Not on file   Other Topics Concern  . Not on file   Social History Narrative  . No narrative on file   Current Outpatient Prescriptions:  . ALPRAZolam (XANAX) 0.5 MG tablet, Take 0.5 mg by mouth., Disp: , Rfl:  . azelastine 0.15 % (205.5 mcg) Spry, 2 sprays by Nasal route., Disp: , Rfl:  . cetirizine (ZYRTEC) 10 MG tablet, Take 10 mg by mouth., Disp: , Rfl:  . citalopram (CELEXA) 20 MG tablet, , Disp: , Rfl:  . diclofenac (VOLTAREN) 0.1 % ophthalmic solution, INSTILL 2 DROPS INTO LEFT EAR 4 TIMES DAILY, Disp: , Rfl: 0 . fluticasone propionate (FLONASE) 50 mcg/actuation nasal spray, 2 sprays by Nasal route., Disp: , Rfl:  . meloxicam (MOBIC) 15 MG tablet, TAKE 1 TABLET BY MOUTH EVERY DAY, Disp: , Rfl: 2 . montelukast (SINGULAIR) 10 mg tablet, Take 10 mg by mouth., Disp: , Rfl:  . oseltamivir (TAMIFLU) 75 MG capsule, TAKE 1 CAPSULE BY MOUTH TWICE A DAY, Disp: , Rfl: 0 . pantoprazole (PROTONIX) 40 MG tablet, Take 40 mg by mouth., Disp: , Rfl:  . traZODone (DESYREL) 50 MG tablet, Take 50 mg by mouth nightly., Disp: , Rfl:  . ciprofloxacin-dexAMETHasone (CIPRODEX) 0.3-0.1 % otic suspension, Place 4 drops into the left ear 2 times daily for 7 days., Disp: 7.5 mL, Rfl: 0  A complete ROS was performed with pertinent positives/negatives noted in the HPI. The  remainder of the ROS are negative.   Physical Exam:   There were no vitals taken for this visit.  General Awake, at baseline alertness during examination.  Eyes No scleral icterus or conjunctival hemorrhage. Globe position appears normal. EOMI.  Right Ear EAC patent, TM intact w/o inflammation. Middle ear well aerated.  Left Ear Crease along the ear lobe very irritated from draining ear. EAC with moist debris and fluid deep in canal, obscuring visualization of the TM.  Nose Patent, no polyps or masses seen on anterior rhinoscopy.  Oral cavity No mucosal lesions or tumors seen. Tongue midline.  Oropharynx Symmetric tonsils.   Neck No abnormal cervical lymphadenopathy. No thyromegaly. No thyroid masses palpated.  Cardio-vascular No cyanosis.  Pulmonary No audible stridor. Breathing easily with no labor.  Neuro Symmetric facial movement.  Psychiatry Appropriate affect and mood for clinic visit.   Independent Review of Additional Tests or Records:  None  Procedures:   Ear Microscopy, Left:  With patient reclined, positioned microscope over the left ear and using suction, removed a small amount of serosangenous fluid. Small pinpoint perforation of the TM anteriorly with fluid pulsing out from middle ear space. Posterior portion of TM very erythematous. Ciprodex drops instilled. Applied Bacitracin to the left ear lobe crease.   Tolerance: excellent Complications: none  Impression & Plans:  Philip Mcneil is a 67 y.o. male with recent influenza. Left sided acute otitis media with spontaneous perforation. Chronic drainage for six days and significant ear pain. Ear cleaned today. Prescribed Ciprodex, 4gtts BID for one week. Advised to keep ear dry while showering. Continue the Augmentin. He may alternate Ibuprofen with the vicodin. Warm compresses may also help.   Follow up in one week for recheck, sooner if symptoms are worsening. Patient agrees with the plan.

## 2018-02-22 ENCOUNTER — Ambulatory Visit (HOSPITAL_COMMUNITY): Payer: Medicare Other

## 2018-02-22 ENCOUNTER — Ambulatory Visit (HOSPITAL_BASED_OUTPATIENT_CLINIC_OR_DEPARTMENT_OTHER)
Admission: RE | Admit: 2018-02-22 | Discharge: 2018-02-22 | Disposition: A | Discharge status: Home / Self Care | Payer: Medicare Other | Source: Ambulatory Visit | Attending: Emergency Medicine | Admitting: Emergency Medicine

## 2018-02-22 ENCOUNTER — Encounter (HOSPITAL_COMMUNITY)
Admission: RE | Disposition: A | Discharge status: Home / Self Care | Payer: Self-pay | Source: Ambulatory Visit | Attending: Emergency Medicine

## 2018-02-22 ENCOUNTER — Other Ambulatory Visit: Payer: Self-pay

## 2018-02-22 ENCOUNTER — Encounter (HOSPITAL_BASED_OUTPATIENT_CLINIC_OR_DEPARTMENT_OTHER): Payer: Self-pay

## 2018-02-22 ENCOUNTER — Ambulatory Visit (HOSPITAL_BASED_OUTPATIENT_CLINIC_OR_DEPARTMENT_OTHER): Payer: Medicare Other | Admitting: Certified Registered Nurse Anesthetist

## 2018-02-22 DIAGNOSIS — J189 Pneumonia, unspecified organism: Secondary | ICD-10-CM

## 2018-02-22 DIAGNOSIS — Z8546 Personal history of malignant neoplasm of prostate: Secondary | ICD-10-CM | POA: Diagnosis not present

## 2018-02-22 DIAGNOSIS — I480 Paroxysmal atrial fibrillation: Secondary | ICD-10-CM | POA: Diagnosis not present

## 2018-02-22 DIAGNOSIS — Z79899 Other long term (current) drug therapy: Secondary | ICD-10-CM | POA: Insufficient documentation

## 2018-02-22 DIAGNOSIS — H7292 Unspecified perforation of tympanic membrane, left ear: Secondary | ICD-10-CM | POA: Insufficient documentation

## 2018-02-22 DIAGNOSIS — Z87891 Personal history of nicotine dependence: Secondary | ICD-10-CM | POA: Insufficient documentation

## 2018-02-22 DIAGNOSIS — E7800 Pure hypercholesterolemia, unspecified: Secondary | ICD-10-CM | POA: Diagnosis not present

## 2018-02-22 DIAGNOSIS — I10 Essential (primary) hypertension: Secondary | ICD-10-CM | POA: Diagnosis not present

## 2018-02-22 DIAGNOSIS — R000 Tachycardia, unspecified: Secondary | ICD-10-CM

## 2018-02-22 DIAGNOSIS — H7291 Unspecified perforation of tympanic membrane, right ear: Secondary | ICD-10-CM | POA: Diagnosis not present

## 2018-02-22 DIAGNOSIS — H6692 Otitis media, unspecified, left ear: Secondary | ICD-10-CM | POA: Diagnosis not present

## 2018-02-22 HISTORY — PX: TYMPANOPLASTY: SHX33

## 2018-02-22 LAB — CBC
HCT: 46.5 % (ref 39.0–52.0)
Hemoglobin: 15 g/dL (ref 13.0–17.0)
MCH: 31.9 pg (ref 26.0–34.0)
MCHC: 32.3 g/dL (ref 30.0–36.0)
MCV: 98.9 fL (ref 78.0–100.0)
Platelets: 248 K/uL (ref 150–400)
RBC: 4.7 MIL/uL (ref 4.22–5.81)
RDW: 12.9 % (ref 11.5–15.5)
WBC: 6.4 K/uL (ref 4.0–10.5)

## 2018-02-22 LAB — COMPREHENSIVE METABOLIC PANEL WITH GFR
ALT: 14 U/L (ref 0–44)
AST: 26 U/L (ref 15–41)
Albumin: 3.8 g/dL (ref 3.5–5.0)
Alkaline Phosphatase: 58 U/L (ref 38–126)
Anion gap: 9 (ref 5–15)
BUN: 8 mg/dL (ref 8–23)
CO2: 26 mmol/L (ref 22–32)
Calcium: 9.2 mg/dL (ref 8.9–10.3)
Chloride: 107 mmol/L (ref 98–111)
Creatinine, Ser: 1 mg/dL (ref 0.61–1.24)
GFR calc Af Amer: >60 mL/min
GFR calc non Af Amer: >60 mL/min
Glucose, Bld: 139 mg/dL — ABNORMAL HIGH (ref 70–99)
Potassium: 3.8 mmol/L (ref 3.5–5.1)
Sodium: 142 mmol/L (ref 135–145)
Total Bilirubin: 0.6 mg/dL (ref 0.3–1.2)
Total Protein: 6.5 g/dL (ref 6.5–8.1)

## 2018-02-22 SURGERY — TYMPANOPLASTY
Anesthesia: General | Site: Ear | Laterality: Left

## 2018-02-22 MED ORDER — PHENYLEPHRINE 40 MCG/ML (10ML) SYRINGE FOR IV PUSH (FOR BLOOD PRESSURE SUPPORT)
PREFILLED_SYRINGE | INTRAVENOUS | Status: DC | PRN
  Administered 2018-02-22: 80 ug via INTRAVENOUS
  Administered 2018-02-22: 40 ug via INTRAVENOUS
  Administered 2018-02-22: 80 ug via INTRAVENOUS
  Administered 2018-02-22: 40 ug via INTRAVENOUS
  Administered 2018-02-22 (×2): 80 ug via INTRAVENOUS

## 2018-02-22 MED ORDER — DEXAMETHASONE SODIUM PHOSPHATE 10 MG/ML IJ SOLN
INTRAMUSCULAR | Status: DC | PRN
  Administered 2018-02-22: 10 mg via INTRAVENOUS

## 2018-02-22 MED ORDER — SODIUM CHLORIDE 0.9 % IV SOLN
1.0000 g | Freq: Once | INTRAVENOUS | Status: AC
  Administered 2018-02-22: 1 g via INTRAVENOUS
  Filled 2018-02-22: qty 10

## 2018-02-22 MED ORDER — IOPAMIDOL (ISOVUE-370) INJECTION 76%
INTRAVENOUS | Status: AC
  Filled 2018-02-22: qty 100

## 2018-02-22 MED ORDER — IOPAMIDOL (ISOVUE-370) INJECTION 76%
100.0000 mL | Freq: Once | INTRAVENOUS | Status: AC | PRN
  Administered 2018-02-22: 100 mL via INTRAVENOUS

## 2018-02-22 MED ORDER — DEXAMETHASONE SODIUM PHOSPHATE 10 MG/ML IJ SOLN
INTRAMUSCULAR | Status: AC
  Filled 2018-02-22: qty 1

## 2018-02-22 MED ORDER — ONDANSETRON HCL 4 MG/2ML IJ SOLN
INTRAMUSCULAR | Status: AC
  Filled 2018-02-22: qty 2

## 2018-02-22 MED ORDER — CIPROFLOXACIN-DEXAMETHASONE 0.3-0.1 % OT SUSP: 3.0000 [drp] | Freq: Three times a day (TID) | OTIC | 2 refills | Status: AC

## 2018-02-22 MED ORDER — LACTATED RINGERS IV SOLN
INTRAVENOUS | Status: DC
  Administered 2018-02-22 (×2): via INTRAVENOUS

## 2018-02-22 MED ORDER — CIPROFLOXACIN-DEXAMETHASONE 0.3-0.1 % OT SUSP
OTIC | Status: AC
  Filled 2018-02-22: qty 7.5

## 2018-02-22 MED ORDER — EPHEDRINE SULFATE-NACL 50-0.9 MG/10ML-% IV SOSY
PREFILLED_SYRINGE | INTRAVENOUS | Status: DC | PRN
  Administered 2018-02-22: 10 mg via INTRAVENOUS
  Administered 2018-02-22: 5 mg via INTRAVENOUS
  Administered 2018-02-22 (×2): 10 mg via INTRAVENOUS
  Administered 2018-02-22: 5 mg via INTRAVENOUS

## 2018-02-22 MED ORDER — MIDAZOLAM HCL 2 MG/2ML IJ SOLN: 1.0000 mg | INTRAMUSCULAR | Status: DC | PRN

## 2018-02-22 MED ORDER — BACITRACIN ZINC 500 UNIT/GM EX OINT
TOPICAL_OINTMENT | CUTANEOUS | Status: AC
  Filled 2018-02-22: qty 28.35

## 2018-02-22 MED ORDER — LIDOCAINE-EPINEPHRINE 1 %-1:100000 IJ SOLN
INTRAMUSCULAR | Status: DC | PRN
  Administered 2018-02-22: 6 mL

## 2018-02-22 MED ORDER — PROPOFOL 10 MG/ML IV BOLUS
INTRAVENOUS | Status: DC | PRN
  Administered 2018-02-22: 170 mg via INTRAVENOUS

## 2018-02-22 MED ORDER — METHYLENE BLUE 0.5 % INJ SOLN
INTRAVENOUS | Status: AC
  Filled 2018-02-22: qty 10

## 2018-02-22 MED ORDER — FENTANYL CITRATE (PF) 100 MCG/2ML IJ SOLN: 50.0000 ug | INTRAMUSCULAR | Status: DC | PRN

## 2018-02-22 MED ORDER — PROPOFOL 500 MG/50ML IV EMUL
INTRAVENOUS | Status: AC
  Filled 2018-02-22: qty 50

## 2018-02-22 MED ORDER — FENTANYL CITRATE (PF) 100 MCG/2ML IJ SOLN
INTRAMUSCULAR | Status: AC
  Filled 2018-02-22: qty 2

## 2018-02-22 MED ORDER — EPINEPHRINE 30 MG/30ML IJ SOLN
INTRAMUSCULAR | Status: AC
  Filled 2018-02-22: qty 1

## 2018-02-22 MED ORDER — SUGAMMADEX SODIUM 200 MG/2ML IV SOLN
INTRAVENOUS | Status: DC | PRN
  Administered 2018-02-22: 250 mg via INTRAVENOUS

## 2018-02-22 MED ORDER — LIDOCAINE 2% (20 MG/ML) 5 ML SYRINGE
INTRAMUSCULAR | Status: AC
  Filled 2018-02-22: qty 5

## 2018-02-22 MED ORDER — ROCURONIUM BROMIDE 50 MG/5ML IV SOSY
PREFILLED_SYRINGE | INTRAVENOUS | Status: DC | PRN
  Administered 2018-02-22: 40 mg via INTRAVENOUS

## 2018-02-22 MED ORDER — BACITRACIN ZINC 500 UNIT/GM EX OINT
TOPICAL_OINTMENT | CUTANEOUS | Status: DC | PRN
  Administered 2018-02-22: 1 via TOPICAL

## 2018-02-22 MED ORDER — LIDOCAINE-EPINEPHRINE 1 %-1:100000 IJ SOLN
INTRAMUSCULAR | Status: AC
  Filled 2018-02-22: qty 1

## 2018-02-22 MED ORDER — FENTANYL CITRATE (PF) 100 MCG/2ML IJ SOLN
INTRAMUSCULAR | Status: DC | PRN
  Administered 2018-02-22: 100 ug via INTRAVENOUS

## 2018-02-22 MED ORDER — ACETAMINOPHEN 500 MG PO TABS
1000.0000 mg | ORAL_TABLET | Freq: Once | ORAL | Status: AC
  Administered 2018-02-22: 1000 mg via ORAL
  Filled 2018-02-22: qty 2

## 2018-02-22 MED ORDER — SODIUM CHLORIDE 0.9 % IV BOLUS
1000.0000 mL | Freq: Once | INTRAVENOUS | Status: AC
  Administered 2018-02-22: 1000 mL via INTRAVENOUS

## 2018-02-22 MED ORDER — CIPROFLOXACIN-DEXAMETHASONE 0.3-0.1 % OT SUSP
OTIC | Status: DC | PRN
  Administered 2018-02-22: 4 [drp] via OTIC

## 2018-02-22 MED ORDER — SODIUM CHLORIDE 0.9 % IV SOLN
500.0000 mg | Freq: Once | INTRAVENOUS | Status: AC
  Administered 2018-02-22: 500 mg via INTRAVENOUS
  Filled 2018-02-22: qty 500

## 2018-02-22 MED ORDER — EPHEDRINE 5 MG/ML INJ
INTRAVENOUS | Status: AC
  Filled 2018-02-22: qty 10

## 2018-02-22 MED ORDER — LIDOCAINE 2% (20 MG/ML) 5 ML SYRINGE
INTRAMUSCULAR | Status: DC | PRN
  Administered 2018-02-22: 80 mg via INTRAVENOUS

## 2018-02-22 MED ORDER — PROMETHAZINE HCL 25 MG/ML IJ SOLN
INTRAMUSCULAR | Status: AC
  Filled 2018-02-22: qty 1

## 2018-02-22 MED ORDER — SCOPOLAMINE 1 MG/3DAYS TD PT72: 1.0000 | MEDICATED_PATCH | Freq: Once | TRANSDERMAL | Status: DC | PRN

## 2018-02-22 MED ORDER — PROMETHAZINE HCL 25 MG/ML IJ SOLN
6.2500 mg | INTRAMUSCULAR | Status: DC | PRN
  Administered 2018-02-22: 6.25 mg via INTRAVENOUS

## 2018-02-22 MED ORDER — METHYLENE BLUE 0.5 % INJ SOLN
INTRAVENOUS | Status: DC | PRN
  Administered 2018-02-22: 08:00:00 via TOPICAL

## 2018-02-22 MED ORDER — MIDAZOLAM HCL 2 MG/2ML IJ SOLN
INTRAMUSCULAR | Status: AC
  Filled 2018-02-22: qty 2

## 2018-02-22 MED ORDER — ONDANSETRON HCL 4 MG/2ML IJ SOLN
INTRAMUSCULAR | Status: DC | PRN
  Administered 2018-02-22: 4 mg via INTRAVENOUS

## 2018-02-22 MED ORDER — MIDAZOLAM HCL 2 MG/2ML IJ SOLN
INTRAMUSCULAR | Status: DC | PRN
  Administered 2018-02-22: 2 mg via INTRAVENOUS

## 2018-02-22 MED ORDER — ROCURONIUM BROMIDE 50 MG/5ML IV SOSY
PREFILLED_SYRINGE | INTRAVENOUS | Status: AC
  Filled 2018-02-22: qty 5

## 2018-02-22 MED ORDER — PHENYLEPHRINE 40 MCG/ML (10ML) SYRINGE FOR IV PUSH (FOR BLOOD PRESSURE SUPPORT)
PREFILLED_SYRINGE | INTRAVENOUS | Status: AC
  Filled 2018-02-22: qty 10

## 2018-02-22 MED ORDER — AZITHROMYCIN 250 MG PO TABS: ORAL_TABLET | ORAL | 0 refills | Status: DC

## 2018-02-22 SURGICAL SUPPLY — 65 items
BENZOIN TINCTURE PRP APPL 2/3 (GAUZE/BANDAGES/DRESSINGS) IMPLANT
BLADE CLIPPER SURG (BLADE) IMPLANT
BLADE NEEDLE 3 SS STRL (BLADE) IMPLANT
BLADE NEEDLE 3MM SS STRL (BLADE)
BNDG CONFORM 4 STRL LF (GAUZE/BANDAGES/DRESSINGS) IMPLANT
BNDG GAUZE ELAST 4 BULKY (GAUZE/BANDAGES/DRESSINGS) IMPLANT
BUR RND 2.0 SOFT (BURR) ×3 IMPLANT
BUR STRYKER TAPERED RND 1.5 (BURR) ×2 IMPLANT
BUR STRYKER TAPERED RND 1.5MM (BURR) ×1
CANISTER SUCT 1200ML W/VALVE (MISCELLANEOUS) ×3 IMPLANT
CLEANER CAUTERY TIP 5X5 PAD (MISCELLANEOUS) ×1 IMPLANT
CLOSURE WOUND 1/2 X4 (GAUZE/BANDAGES/DRESSINGS)
COTTONBALL LRG STERILE PKG (GAUZE/BANDAGES/DRESSINGS) ×3 IMPLANT
DECANTER SPIKE VIAL GLASS SM (MISCELLANEOUS) ×3 IMPLANT
DERMABOND ADVANCED (GAUZE/BANDAGES/DRESSINGS) ×2
DERMABOND ADVANCED .7 DNX12 (GAUZE/BANDAGES/DRESSINGS) ×1 IMPLANT
DRAPE EENT ADH APERT 31X51 STR (DRAPES) ×3 IMPLANT
DRAPE INCISE 23X17 IOBAN STRL (DRAPES)
DRAPE INCISE IOBAN 23X17 STRL (DRAPES) IMPLANT
DRAPE MICROSCOPE URBAN (DRAPES) IMPLANT
DRAPE MICROSCOPE WILD 40.5X102 (DRAPES) IMPLANT
DROPPER MEDICINE STER 1.5ML LF (MISCELLANEOUS) IMPLANT
DRSG GLASSCOCK MASTOID ADT (GAUZE/BANDAGES/DRESSINGS) IMPLANT
DRSG GLASSCOCK MASTOID PED (GAUZE/BANDAGES/DRESSINGS) IMPLANT
DRSG TELFA 3X8 NADH (GAUZE/BANDAGES/DRESSINGS) IMPLANT
ELECT COATED BLADE 2.86 ST (ELECTRODE) ×3 IMPLANT
ELECT REM PT RETURN 9FT ADLT (ELECTROSURGICAL) ×3
ELECTRODE REM PT RTRN 9FT ADLT (ELECTROSURGICAL) ×1 IMPLANT
GAUZE 4X4 16PLY RFD (DISPOSABLE) IMPLANT
GAUZE SPONGE 4X4 12PLY STRL (GAUZE/BANDAGES/DRESSINGS) IMPLANT
GAUZE SPONGE 4X4 12PLY STRL LF (GAUZE/BANDAGES/DRESSINGS) IMPLANT
GLOVE BIO SURGEON STRL SZ7 (GLOVE) ×3 IMPLANT
GLOVE BIOGEL PI IND STRL 7.5 (GLOVE) ×2 IMPLANT
GLOVE BIOGEL PI INDICATOR 7.5 (GLOVE) ×4
GLOVE ECLIPSE 7.5 STRL STRAW (GLOVE) ×3 IMPLANT
GOWN STRL REUS W/ TWL LRG LVL3 (GOWN DISPOSABLE) ×2 IMPLANT
GOWN STRL REUS W/ TWL XL LVL3 (GOWN DISPOSABLE) IMPLANT
GOWN STRL REUS W/TWL LRG LVL3 (GOWN DISPOSABLE) ×4
GOWN STRL REUS W/TWL XL LVL3 (GOWN DISPOSABLE)
IV CATH AUTO 14GX1.75 SAFE ORG (IV SOLUTION) IMPLANT
IV SET EXT 30 76VOL 4 MALE LL (IV SETS) ×3 IMPLANT
NDL SAFETY ECLIPSE 18X1.5 (NEEDLE) ×1 IMPLANT
NEEDLE HYPO 18GX1.5 SHARP (NEEDLE) ×2
NEEDLE PRECISIONGLIDE 27X1.5 (NEEDLE) ×3 IMPLANT
NS IRRIG 1000ML POUR BTL (IV SOLUTION) ×3 IMPLANT
PACK BASIN DAY SURGERY FS (CUSTOM PROCEDURE TRAY) ×3 IMPLANT
PACK ENT DAY SURGERY (CUSTOM PROCEDURE TRAY) ×3 IMPLANT
PAD CLEANER CAUTERY TIP 5X5 (MISCELLANEOUS) ×2
PENCIL FOOT CONTROL (ELECTRODE) ×3 IMPLANT
SHEET MEDIUM DRAPE 40X70 STRL (DRAPES) IMPLANT
SLEEVE SCD COMPRESS KNEE MED (MISCELLANEOUS) ×3 IMPLANT
SPONGE SURGIFOAM ABS GEL 12-7 (HEMOSTASIS) ×3 IMPLANT
STRIP CLOSURE SKIN 1/2X4 (GAUZE/BANDAGES/DRESSINGS) IMPLANT
SUT CHROMIC 3 0 PS 2 (SUTURE) IMPLANT
SUT CHROMIC 4 0 P 3 18 (SUTURE) IMPLANT
SUT CHROMIC 4 0 PS 2 18 (SUTURE) IMPLANT
SUT ETHILON 5 0 P 3 18 (SUTURE) ×2
SUT NYLON ETHILON 5-0 P-3 1X18 (SUTURE) ×1 IMPLANT
SUT PLAIN 5 0 P 3 18 (SUTURE) IMPLANT
SUT VIC AB 3-0 FS2 27 (SUTURE) IMPLANT
SYR 5ML LL (SYRINGE) ×3 IMPLANT
SYR BULB 3OZ (MISCELLANEOUS) IMPLANT
TOWEL GREEN STERILE FF (TOWEL DISPOSABLE) ×3 IMPLANT
TRAY DSU PREP LF (CUSTOM PROCEDURE TRAY) ×3 IMPLANT
TUBING IRRIGATION (MISCELLANEOUS) IMPLANT

## 2018-02-22 NOTE — Anesthesia Procedure Notes (Signed)
Procedure Name: Intubation Date/Time: 02/22/2018 7:43 AM Performed by: Genelle Bal, CRNA Pre-anesthesia Checklist: Patient identified, Emergency Drugs available, Suction available and Patient being monitored Patient Re-evaluated:Patient Re-evaluated prior to induction Oxygen Delivery Method: Circle system utilized Preoxygenation: Pre-oxygenation with 100% oxygen Induction Type: IV induction Ventilation: Mask ventilation without difficulty Laryngoscope Size: Miller and 2 Grade View: Grade I Tube type: Oral Tube size: 8.0 mm Number of attempts: 1 Airway Equipment and Method: Stylet and Oral airway Placement Confirmation: ETT inserted through vocal cords under direct vision,  positive ETCO2 and breath sounds checked- equal and bilateral Secured at: 23 cm Tube secured with: Tape Dental Injury: Teeth and Oropharynx as per pre-operative assessment

## 2018-02-22 NOTE — Discharge Instructions (Addendum)
° ° ° °  It was our pleasure to provide your ER care today - we hope that you feel better.  Take antibiotic as prescribed.   Follow up with primary care doctor in the coming week for recheck. Follow up with ENT as discussed with them.  A few lab tests remain pending (I.e. Thyroid tests), and will result in 1 days time - have your doctor follow up on that result.  Return to ER if worse, new symptoms,  chest pain, increased trouble breathing, weak/fainting, other concern.       Okay to use Tylenol and/or Motrin as needed for pain.  Tuesday late morning, remove the dressing.  Undo the Velcro strap and the entire dressing should come off easily.  Remove the adhesive pad from the forehead area.  There is an incision behind the ear that is glued together.  There is no care necessary for that.  Inside the ear is a cottonball, remove that, place 3 drops from the prescription, and then replace a fresh cottonball.  Repeat that 3 times daily.  Keep all water out of the ear.  Avoid the following: Nose blowing Straining Lifting greater than 10 pounds Sneezing with mouth closed   Post Anesthesia Home Care Instructions  Activity: Get plenty of rest for the remainder of the day. A responsible individual must stay with you for 24 hours following the procedure.  For the next 24 hours, DO NOT: -Drive a car -Paediatric nurse -Drink alcoholic beverages -Take any medication unless instructed by your physician -Make any legal decisions or sign important papers.  Meals: Start with liquid foods such as gelatin or soup. Progress to regular foods as tolerated. Avoid greasy, spicy, heavy foods. If nausea and/or vomiting occur, drink only clear liquids until the nausea and/or vomiting subsides. Call your physician if vomiting continues.  Special Instructions/Symptoms: Your throat may feel dry or sore from the anesthesia or the breathing tube placed in your throat during surgery. If this causes discomfort,  gargle with warm salt water. The discomfort should disappear within 24 hours.  If you had a scopolamine patch placed behind your ear for the management of post- operative nausea and/or vomiting:  1. The medication in the patch is effective for 72 hours, after which it should be removed.  Wrap patch in a tissue and discard in the trash. Wash hands thoroughly with soap and water. 2. You may remove the patch earlier than 72 hours if you experience unpleasant side effects which may include dry mouth, dizziness or visual disturbances. 3. Avoid touching the patch. Wash your hands with soap and water after contact with the patch.

## 2018-02-22 NOTE — Anesthesia Preprocedure Evaluation (Addendum)
Anesthesia Evaluation  Patient identified by MRN, date of birth, ID band Patient awake    Reviewed: Allergy & Precautions, NPO status , Patient's Chart, lab work & pertinent test results  History of Anesthesia Complications Negative for: history of anesthetic complications  Airway Mallampati: II  TM Distance: >3 FB Neck ROM: Full    Dental no notable dental hx.    Pulmonary neg pulmonary ROS, former smoker,    Pulmonary exam normal        Cardiovascular hypertension, Normal cardiovascular exam     Neuro/Psych PSYCHIATRIC DISORDERS Anxiety negative neurological ROS     GI/Hepatic Neg liver ROS, GERD  ,  Endo/Other  negative endocrine ROS  Renal/GU negative Renal ROS  negative genitourinary   Musculoskeletal negative musculoskeletal ROS (+)   Abdominal   Peds  Hematology negative hematology ROS (+)   Anesthesia Other Findings   Reproductive/Obstetrics                            Anesthesia Physical Anesthesia Plan  ASA: II  Anesthesia Plan: General   Post-op Pain Management:    Induction: Intravenous  PONV Risk Score and Plan: 3 and Dexamethasone, Ondansetron, Treatment may vary due to age or medical condition, Propofol infusion and Midazolam  Airway Management Planned: Oral ETT  Additional Equipment:   Intra-op Plan:   Post-operative Plan: Extubation in OR  Informed Consent: I have reviewed the patients History and Physical, chart, labs and discussed the procedure including the risks, benefits and alternatives for the proposed anesthesia with the patient or authorized representative who has indicated his/her understanding and acceptance.     Plan Discussed with:   Anesthesia Plan Comments:        Anesthesia Quick Evaluation

## 2018-02-22 NOTE — ED Provider Notes (Signed)
Signed out to d/c to home if/when ct neg for PE.   CTA neg for PE. Note made of possible early pna. Pt does note recent increased cough, subj fever yesterday.   Rocephin and zithromax iv.   Recheck pt, no increased wob. No cp.   Hr 94, rr 14. Pulse ox 98%.  Pt currently appears stable for d/c.      Lajean Saver, MD 02/22/18 [phone]

## 2018-02-22 NOTE — ED Notes (Signed)
CBC and CMP collected and sent to Main Lab. I-Stat also drawn and put on rocker in Mini Lab.

## 2018-02-22 NOTE — ED Provider Notes (Signed)
[place] EMERGENCY DEPARTMENT Provider Note   CSN: [id number] Arrival date & time: 02/22/18  1117     History   Chief Complaint Chief Complaint  Patient presents with  . Irregular Heart Beat    HPI Philip Mcneil is a 67 y.o. male.  67 year old male with past medical history below who presents with tachycardia.  The patient had a routinely scheduled tympanoplasty today at the outpatient center and when he was in PACU, he was noted to be tachycardic.  EKG showed atrial fibrillation.  He has no history of A. fib therefore he was sent here for further evaluation.  He denies any complaints today.  No chest pain, shortness of breath, heart racing sensation, lightheadedness, fevers, urinary symptoms, or recent illness.  He endorses several months of generalized fatigue but no other complaints.  The history is provided by the patient.    Past Medical History:  Diagnosis Date  . Cancer ([place])   . GERD (gastroesophageal reflux disease)     There are no active problems to display for this patient.   Past Surgical History:  Procedure Laterality Date  . BACK SURGERY     x2  . CERVICAL LAMINECTOMY    . INSERTION OF MESH N/A 11/23/2013   Procedure: INSERTION OF MESH;  Surgeon: Jamesetta So, MD;  Location: [facility] ORS;  Service: General;  Laterality: N/A;  . KNEE ARTHROSCOPY Left   . ORIF FOOT FRACTURE Right   . PROSTATECTOMY     [place]  . ROTATOR CUFF REPAIR Right   . THUMB AMPUTATION Left   . UMBILICAL HERNIA REPAIR N/A 11/23/2013   Procedure: UMBILICAL HERNIORRHAPHY;  Surgeon: Jamesetta So, MD;  Location: [facility] ORS;  Service: General;  Laterality: N/A;        Home Medications    Prior to Admission medications   Medication Sig Start Date End Date Taking? Authorizing Provider  ALPRAZolam Duanne Moron) 0.5 MG tablet Take 0.5 mg by mouth 3 (three) times daily as needed. for anxiety 10/11/15  Yes [provider]  Aspirin-Acetaminophen-Caffeine (GOODY  HEADACHE PO) Take 1 packet by mouth daily as needed (headache/pain).    Yes [provider]  atorvastatin (LIPITOR) 20 MG tablet Take 20 mg by mouth daily.   Yes [provider]  Azelastine HCl 0.15 % SOLN Place 2 sprays into both nostrils 2 (two) times daily. Patient taking differently: Place 2 sprays into both nostrils daily.  04/22/16  Yes Valentina Shaggy, MD  Azelastine-Fluticasone [phone] MCG/ACT SUSP Place 1 spray into the nose 2 (two) times daily. 08/21/15  Yes Gean Quint, MD  cetirizine (ZYRTEC) 10 MG tablet Take 10 mg by mouth daily as needed for allergies or rhinitis.    Yes [provider]  lisinopril (PRINIVIL,ZESTRIL) 20 MG tablet Take 20 mg by mouth daily.   Yes [provider]  pantoprazole (PROTONIX) 40 MG tablet Take 40 mg by mouth daily.   Yes [provider]  ciprofloxacin-dexamethasone (CIPRODEX) OTIC suspension Place 3 drops into the left ear 3 (three) times daily for 21 days. 02/22/18 03/15/18  [name], MD  fluticasone (FLONASE) 50 MCG/ACT nasal spray Place 2 sprays into both nostrils daily. Patient not taking: Reported on 02/22/2018 04/22/16   Valentina Shaggy, MD    Family History Family History  Problem Relation Age of Onset  . Alzheimer's disease Mother   . Alzheimer's disease Father   . Prostate cancer Father   . Alzheimer's disease Paternal Grandfather   .  Allergic rhinitis Neg Hx   . Asthma Neg Hx   . Eczema Neg Hx   . Immunodeficiency Neg Hx   . Urticaria Neg Hx     Social History Social History   Tobacco Use  . Smoking status: Former Smoker    Packs/day: 0.25    Years: 20.00    Pack years: 5.00    Types: Cigarettes    Last attempt to quit: 11/18/2003    Years since quitting: 14.2  . Smokeless tobacco: Never Used  Substance Use Topics  . Alcohol use: No    Alcohol/week: 0.0 standard drinks  . Drug use: No     Allergies   Morphine and related and Percocet  [oxycodone-acetaminophen]   Review of Systems Review of Systems   Physical Exam Updated Vital Signs BP (!) 137/98 (BP Location: Right Arm)   Pulse 86   Temp 97.8 F (36.6 C)   Resp 16   Ht 5\' 9"  (1.753 m)   Wt 90.9 kg   SpO2 97%   BMI 29.59 kg/m   Physical Exam  Constitutional: He is oriented to person, place, and time. He appears well-developed and well-nourished. No distress.  HENT:  Head: Normocephalic.  L ear bandage  Eyes: Pupils are equal, round, and reactive to light. Conjunctivae are normal.  Neck: Neck supple.  Cardiovascular: Regular rhythm and normal heart sounds. Tachycardia present.  No murmur heard. Pulmonary/Chest: Effort normal and breath sounds normal.  Abdominal: Soft. Bowel sounds are normal. He exhibits no distension. There is no tenderness.  Musculoskeletal: He exhibits no edema.  Neurological: He is alert and oriented to person, place, and time.  Fluent speech  Skin: Skin is warm and dry.  Psychiatric: He has a normal mood and affect. Judgment normal.  Nursing note and vitals reviewed.    ED Treatments / Results  Labs (all labs ordered are listed, but only abnormal results are displayed) Labs Reviewed  COMPREHENSIVE METABOLIC PANEL - Abnormal; Notable for the following components:      Result Value   Glucose, Bld 139 (*)    All other components within normal limits  CBC  TSH  T4, FREE    EKG None  Radiology Dg Chest 2 View  Result Date: 02/22/2018 CLINICAL DATA:  Episode atrial fibrillation with ventricular rate of 140 beats per minute. The patient was postoperative from having repair of a perforated left eardrum. The patient does note fatigue for several months. Former smoker. EXAM: CHEST - 2 VIEW COMPARISON:  Portable chest x-ray of Oct 14, 2017 FINDINGS: The lungs are adequately inflated. There is no focal infiltrate. There is no pleural effusion. There is soft tissue density lateral to the left cardiac apex which is not new. The  heart and pulmonary vascularity are normal. The mediastinum is normal in width. The trachea is midline. The bony thorax exhibits no acute abnormality. IMPRESSION: There is no pneumonia, pulmonary edema, nor other acute cardiopulmonary abnormality. Electronically Signed   By: David  Martinique M.D.   On: 02/22/2018 13:07   Ct Angio Chest Pe W And/or Wo Contrast  Result Date: 02/22/2018 CLINICAL DATA:  Acute onset tachycardia with recent surgery EXAM: CT ANGIOGRAPHY CHEST WITH CONTRAST TECHNIQUE: Multidetector CT imaging of the chest was performed using the standard protocol during bolus administration of intravenous contrast. Multiplanar CT image reconstructions and MIPs were obtained to evaluate the vascular anatomy. CONTRAST:  100 mL ISOVUE-370 IOPAMIDOL (ISOVUE-370) INJECTION 76% COMPARISON:  Chest radiograph February 22, 2018 FINDINGS: Cardiovascular: There is no  demonstrable pulmonary embolus. There is no thoracic aortic aneurysm or dissection. Visualized great vessels appear normal. No pericardial effusion or pericardial thickening evident. There are foci of coronary artery calcification. Mediastinum/Nodes: Visualized thyroid appears unremarkable. There is no appreciable thoracic adenopathy. No esophageal lesions are evident. Lungs/Pleura: There is bibasilar atelectasis. A small amount of consolidation is noted in the posterior left base. There is also mild consolidation in portions of the medial segment and posterior aspect of the superior segment of the right lower lobe. No pleural effusion or pleural thickening evident. Upper Abdomen: Visualized upper abdominal structures appear normal. Musculoskeletal: There are foci of degenerative change in the thoracic spine. There are no blastic or lytic bone lesions. There are no chest wall lesions evident. Review of the MIP images confirms the above findings. IMPRESSION: 1. No demonstrable pulmonary embolus. No thoracic aortic aneurysm or dissection. There are foci of  coronary artery calcification. 2. Lower lobe atelectatic change bilaterally. Mild consolidation in portions of each lower lobe. Question early pneumonia or aspiration to account for these findings. 3.  No evident thoracic adenopathy. Electronically Signed   By: Lowella Grip III M.D.   On: 02/22/2018 16:14    Procedures Procedures (including critical care time)  Medications Ordered in ED Medications  iopamidol (ISOVUE-370) 76 % injection (has no administration in time range)  sodium chloride 0.9 % bolus 1,000 mL (0 mLs Intravenous Stopped 02/22/18 1619)  acetaminophen (TYLENOL) tablet 1,000 mg (1,000 mg Oral Given 02/22/18 1618)  iopamidol (ISOVUE-370) 76 % injection 100 mL (100 mLs Intravenous Contrast Given 02/22/18 1553)     Initial Impression / Assessment and Plan / ED Course  I have reviewed the triage vital signs and the nursing notes.  Pertinent labs & imaging results that were available during my care of the patient were reviewed by me and considered in my medical decision making (see chart for details).    Well appearing, HR near 110. EKG here shows sinus tachycardia. No pain, dehydration, or fever to explain tachycardia. Basic labs reassuring. I reviewed initial EKG which does look like A fib but it was resolved by the time he got to ED. I discussed his case with cardiologist, Shelton Silvas, who advised that because the brief A. fib was in the setting of surgery and sedation medications and it has now completely resolved, she would not recommend any further medical therapy at this time.  He can follow-up with his PCP and if he develops any symptoms he can be referred to cardiology.  Regarding his tachycardia, I have ordered CTA to r/o PE given tachycardia and h/o cancer. I am signing out to oncoming provider who will f/u on imaging.  Final Clinical Impressions(s) / ED Diagnoses   Final diagnoses:  None    ED Discharge Orders         Ordered    Increase activity slowly      02/22/18 0912    Diet - low sodium heart healthy     02/22/18 0912    ciprofloxacin-dexamethasone (CIPRODEX) OTIC suspension  3 times daily     02/22/18 0912           Hamda Klutts, Wenda Overland, MD 02/22/18 1621

## 2018-02-22 NOTE — Anesthesia Postprocedure Evaluation (Signed)
Anesthesia Post Note  Patient: ENDRE COUTTS  Procedure(s) Performed: TYMPANOPLASTY (Left Ear)     Patient location during evaluation: PACU Anesthesia Type: General Level of consciousness: awake and alert Pain management: pain level controlled Vital Signs Assessment: post-procedure vital signs reviewed and stable Respiratory status: spontaneous breathing, nonlabored ventilation and respiratory function stable Cardiovascular status: blood pressure returned to baseline, stable and tachycardic Postop Assessment: no apparent nausea or vomiting Anesthetic complications: no Comments: Pt developed new onset atrial fibrillation in PACU. Hemodynamics otherwise stable and pt with no complaints, but no documented history of A fib prior to this episode. Pt was therefore transferred to Emergency Department for further evaluation.    Last Vitals:  Vitals:   02/22/18 1105 02/22/18 1110  BP:    Pulse: (!) 109 (!) 110  Resp: 13   Temp:    SpO2: 93% 94%    Last Pain:  Vitals:   02/22/18 1145  TempSrc:   PainSc: 0-No pain                 Lidia Collum

## 2018-02-22 NOTE — Transfer of Care (Signed)
Immediate Anesthesia Transfer of Care Note  Patient: Philip Mcneil  Procedure(s) Performed: TYMPANOPLASTY (Left Ear)  Patient Location: PACU  Anesthesia Type:General  Level of Consciousness: awake, alert , drowsy and patient cooperative  Airway & Oxygen Therapy: Patient Spontanous Breathing and Patient connected to face mask oxygen  Post-op Assessment: Report given to RN and Post -op Vital signs reviewed and stable  Post vital signs: Reviewed and stable  Last Vitals:  Vitals Value Taken Time  BP 128/71 02/22/2018  9:13 AM  Temp    Pulse 107 02/22/2018  9:13 AM  Resp    SpO2 98 % 02/22/2018  9:13 AM  Vitals shown include unvalidated device data.  Last Pain:  Vitals:   02/22/18 0634  TempSrc: Oral         Complications: No apparent anesthesia complications

## 2018-02-22 NOTE — Op Note (Signed)
OPERATIVE REPORT  DATE OF SURGERY: 02/22/2018  PATIENT:  [name],  67 y.o. male  PRE-OPERATIVE DIAGNOSIS:  Tympanic Membrane perforation of the left  POST-OPERATIVE DIAGNOSIS:  Tympanic Membrane perforation of the left  PROCEDURE:  Procedure(s): TYMPANOPLASTY  SURGEON:  Beckie Salts, MD  ASSISTANTS: None  ANESTHESIA:   General   EBL: 30 ml  DRAINS: None  LOCAL MEDICATIONS USED: 1% Xylocaine with epinephrine  SPECIMEN:  none  COUNTS:  Correct  PROCEDURE DETAILS: The patient was taken to the operating room and placed on the operating table in the supine position. Following induction of general endotracheal anesthesia, the left ear was prepped and draped in a standard fashion.  The operating microscope was draped and used throughout the case.  The ear canal was cleaned of cerumen.  A large central perforation, near total, was identified.    The external auditory canal was infiltrated with local anesthetic solution.  Radial incisions were created at 9:00 and 4:00.  A round knife was used to elevate a vascular strip posteriorly and start the tympanomeatal flap anteriorly.  Postauricular sulcus was infiltrated with local anesthetic solution electrocautery was used to incise the skin subcutaneous tissue and identified the temporalis fascia which was harvested pressed and dried on the back table.  The linea temporalis and mastoid periosteum were divided down to the bone in a T fashion and the ear was brought forward and secured in place with a Perkins retractor.   The edges of the perforation were removed using a sharp otologic pick and cup forceps.  A tab knife was used to elevate the annulus circumferentially.  Epithelium was cleaned off of the manubrium of the malleus.  These maneuvers resulted in a total perforation.  The tympanomeatal flap was then brought forward.  There is a large bony prominence of the anterior canal this was taken down after exposing the bone using an  otologic drill.  This was also used to remove some excess bone along the lateral aspect of the mastoid to facilitate exposure.  The graft was sized notched and placed in an underlay technique.    The middle ear was packed with saline soaked Gelfoam prior to placing the graft.  The graft was secured in place and the flap was brought back to its native position ensuring at all edges of the flap were laying nicely on top of the graft.  The ear canal was then packed with Ciprodex soaked Gelfoam.  The mastoid periosteum was reapproximated with 3-0 chromic suture.  A running 3-0 chromic subcuticular closure was used on the incision and Dermabond was used on the surface.  The external meatus was reinspected and the vascular strip was laying nicely down on the ear canal bone.  Additional Gelfoam packing was placed.  A cotton ball with bacitracin was placed at the meatus.  A Glasscock dressing was applied and the patient was awakened extubated and transferred to recovery in stable condition.    PATIENT DISPOSITION:  To PACU, stable

## 2018-02-22 NOTE — ED Notes (Signed)
Patient transported to CT 

## 2018-02-22 NOTE — ED Notes (Signed)
Patient verbalizes understanding of discharge instructions. Opportunity for questioning and answers were provided. Armband removed by staff, pt discharged from ED.  

## 2018-02-22 NOTE — Interval H&P Note (Signed)
History and Physical Interval Note:  02/22/2018 7:20 AM  Philip Mcneil  has presented today for surgery, with the diagnosis of Tympanic Membrane perforation of the left  The various methods of treatment have been discussed with the patient and family. After consideration of risks, benefits and other options for treatment, the patient has consented to  Procedure(s): TYMPANOPLASTY (Left) as a surgical intervention .  The patient's history has been reviewed, patient examined, no change in status, stable for surgery.  I have reviewed the patient's chart and labs.  Questions were answered to the patient's satisfaction.     [name]

## 2018-02-22 NOTE — Progress Notes (Signed)
Pt has been seen by cardiologist Dr Jacinta Shoe in Dec 2108 for chest pain with follow up in Jan 2019.Has not seen MD since that time. EKG showed ST at time of that visit. No history of afib from pt record.Dr Christella Hartigan desires cardiology consult for new onset afib. Will transfer to [facility] ED for evaluation.

## 2018-02-22 NOTE — ED Triage Notes (Signed)
Patient was at the surgical center having his left ruptured eardrum repaired was in post-op and his heart rate increased to 140 a fib. According to patient he has never been dx with a-fib. C/o feeling tired for the last several months. Had echo done 5/28 and states everything was ok/. Denies chest pain

## 2018-02-23 ENCOUNTER — Encounter (HOSPITAL_BASED_OUTPATIENT_CLINIC_OR_DEPARTMENT_OTHER): Payer: Self-pay | Admitting: Otolaryngology

## 2018-03-01 DIAGNOSIS — I10 Essential (primary) hypertension: Secondary | ICD-10-CM | POA: Diagnosis not present

## 2018-03-01 DIAGNOSIS — J189 Pneumonia, unspecified organism: Secondary | ICD-10-CM | POA: Diagnosis not present

## 2018-03-01 DIAGNOSIS — K210 Gastro-esophageal reflux disease with esophagitis: Secondary | ICD-10-CM | POA: Diagnosis not present

## 2018-03-01 DIAGNOSIS — Z6830 Body mass index (BMI) 30.0-30.9, adult: Secondary | ICD-10-CM | POA: Diagnosis not present

## 2018-03-01 DIAGNOSIS — I4891 Unspecified atrial fibrillation: Secondary | ICD-10-CM | POA: Diagnosis not present

## 2018-03-01 DIAGNOSIS — F411 Generalized anxiety disorder: Secondary | ICD-10-CM | POA: Diagnosis not present

## 2018-05-17 DIAGNOSIS — Z6831 Body mass index (BMI) 31.0-31.9, adult: Secondary | ICD-10-CM | POA: Diagnosis not present

## 2018-05-17 DIAGNOSIS — J019 Acute sinusitis, unspecified: Secondary | ICD-10-CM | POA: Diagnosis not present

## 2018-05-17 DIAGNOSIS — J029 Acute pharyngitis, unspecified: Secondary | ICD-10-CM | POA: Diagnosis not present

## 2018-05-24 DIAGNOSIS — E782 Mixed hyperlipidemia: Secondary | ICD-10-CM | POA: Diagnosis not present

## 2018-05-24 DIAGNOSIS — R5383 Other fatigue: Secondary | ICD-10-CM | POA: Diagnosis not present

## 2018-05-24 DIAGNOSIS — K210 Gastro-esophageal reflux disease with esophagitis: Secondary | ICD-10-CM | POA: Diagnosis not present

## 2018-05-24 DIAGNOSIS — I10 Essential (primary) hypertension: Secondary | ICD-10-CM | POA: Diagnosis not present

## 2018-05-24 DIAGNOSIS — E7800 Pure hypercholesterolemia, unspecified: Secondary | ICD-10-CM | POA: Diagnosis not present

## 2018-05-26 DIAGNOSIS — Z0001 Encounter for general adult medical examination with abnormal findings: Secondary | ICD-10-CM | POA: Diagnosis not present

## 2018-05-26 DIAGNOSIS — F411 Generalized anxiety disorder: Secondary | ICD-10-CM | POA: Diagnosis not present

## 2018-05-26 DIAGNOSIS — K210 Gastro-esophageal reflux disease with esophagitis: Secondary | ICD-10-CM | POA: Diagnosis not present

## 2018-05-26 DIAGNOSIS — I10 Essential (primary) hypertension: Secondary | ICD-10-CM | POA: Diagnosis not present

## 2018-05-26 DIAGNOSIS — Z6831 Body mass index (BMI) 31.0-31.9, adult: Secondary | ICD-10-CM | POA: Diagnosis not present

## 2018-05-26 DIAGNOSIS — R5383 Other fatigue: Secondary | ICD-10-CM | POA: Diagnosis not present

## 2018-05-26 DIAGNOSIS — I4891 Unspecified atrial fibrillation: Secondary | ICD-10-CM | POA: Diagnosis not present

## 2018-05-26 DIAGNOSIS — J189 Pneumonia, unspecified organism: Secondary | ICD-10-CM | POA: Diagnosis not present

## 2018-06-07 DIAGNOSIS — H35033 Hypertensive retinopathy, bilateral: Secondary | ICD-10-CM | POA: Diagnosis not present

## 2018-07-09 DIAGNOSIS — H9042 Sensorineural hearing loss, unilateral, left ear, with unrestricted hearing on the contralateral side: Secondary | ICD-10-CM | POA: Diagnosis not present

## 2018-07-13 DIAGNOSIS — L03032 Cellulitis of left toe: Secondary | ICD-10-CM | POA: Diagnosis not present

## 2018-07-13 DIAGNOSIS — M79672 Pain in left foot: Secondary | ICD-10-CM | POA: Diagnosis not present

## 2018-07-27 DIAGNOSIS — L600 Ingrowing nail: Secondary | ICD-10-CM | POA: Diagnosis not present

## 2018-07-27 DIAGNOSIS — M79675 Pain in left toe(s): Secondary | ICD-10-CM | POA: Diagnosis not present

## 2018-08-10 DIAGNOSIS — M79672 Pain in left foot: Secondary | ICD-10-CM | POA: Diagnosis not present

## 2018-08-10 DIAGNOSIS — M79675 Pain in left toe(s): Secondary | ICD-10-CM | POA: Diagnosis not present

## 2018-08-10 DIAGNOSIS — L600 Ingrowing nail: Secondary | ICD-10-CM | POA: Diagnosis not present

## 2018-08-16 DIAGNOSIS — H2513 Age-related nuclear cataract, bilateral: Secondary | ICD-10-CM | POA: Diagnosis not present

## 2018-08-16 DIAGNOSIS — H25013 Cortical age-related cataract, bilateral: Secondary | ICD-10-CM | POA: Diagnosis not present

## 2018-08-16 DIAGNOSIS — H35371 Puckering of macula, right eye: Secondary | ICD-10-CM | POA: Diagnosis not present

## 2018-08-16 DIAGNOSIS — H35033 Hypertensive retinopathy, bilateral: Secondary | ICD-10-CM | POA: Diagnosis not present

## 2018-08-16 DIAGNOSIS — H35341 Macular cyst, hole, or pseudohole, right eye: Secondary | ICD-10-CM | POA: Diagnosis not present

## 2018-09-20 ENCOUNTER — Other Ambulatory Visit: Payer: Self-pay

## 2018-09-20 ENCOUNTER — Encounter (INDEPENDENT_AMBULATORY_CARE_PROVIDER_SITE_OTHER): Payer: Medicare Other | Admitting: Ophthalmology

## 2018-09-20 DIAGNOSIS — H35341 Macular cyst, hole, or pseudohole, right eye: Secondary | ICD-10-CM | POA: Diagnosis not present

## 2018-09-20 DIAGNOSIS — I10 Essential (primary) hypertension: Secondary | ICD-10-CM | POA: Diagnosis not present

## 2018-09-20 DIAGNOSIS — H43813 Vitreous degeneration, bilateral: Secondary | ICD-10-CM

## 2018-09-20 DIAGNOSIS — H2513 Age-related nuclear cataract, bilateral: Secondary | ICD-10-CM

## 2018-09-20 DIAGNOSIS — H353131 Nonexudative age-related macular degeneration, bilateral, early dry stage: Secondary | ICD-10-CM

## 2018-09-20 DIAGNOSIS — H35371 Puckering of macula, right eye: Secondary | ICD-10-CM

## 2018-09-20 DIAGNOSIS — H35033 Hypertensive retinopathy, bilateral: Secondary | ICD-10-CM | POA: Diagnosis not present

## 2018-10-08 DIAGNOSIS — H2512 Age-related nuclear cataract, left eye: Secondary | ICD-10-CM | POA: Diagnosis not present

## 2018-10-08 DIAGNOSIS — H25013 Cortical age-related cataract, bilateral: Secondary | ICD-10-CM | POA: Diagnosis not present

## 2018-10-08 DIAGNOSIS — H25012 Cortical age-related cataract, left eye: Secondary | ICD-10-CM | POA: Diagnosis not present

## 2018-10-08 DIAGNOSIS — H2513 Age-related nuclear cataract, bilateral: Secondary | ICD-10-CM | POA: Diagnosis not present

## 2018-11-03 DIAGNOSIS — H2511 Age-related nuclear cataract, right eye: Secondary | ICD-10-CM | POA: Diagnosis not present

## 2018-11-03 DIAGNOSIS — H25011 Cortical age-related cataract, right eye: Secondary | ICD-10-CM | POA: Diagnosis not present

## 2018-11-09 DIAGNOSIS — H2511 Age-related nuclear cataract, right eye: Secondary | ICD-10-CM | POA: Diagnosis not present

## 2018-11-09 DIAGNOSIS — H25811 Combined forms of age-related cataract, right eye: Secondary | ICD-10-CM | POA: Diagnosis not present

## 2018-11-09 DIAGNOSIS — H25011 Cortical age-related cataract, right eye: Secondary | ICD-10-CM | POA: Diagnosis not present

## 2019-01-28 DIAGNOSIS — Z23 Encounter for immunization: Secondary | ICD-10-CM | POA: Diagnosis not present

## 2019-03-23 ENCOUNTER — Encounter (INDEPENDENT_AMBULATORY_CARE_PROVIDER_SITE_OTHER): Payer: Medicare Other | Admitting: Ophthalmology

## 2019-03-23 ENCOUNTER — Other Ambulatory Visit: Payer: Self-pay

## 2019-03-23 DIAGNOSIS — H43813 Vitreous degeneration, bilateral: Secondary | ICD-10-CM

## 2019-03-23 DIAGNOSIS — H35371 Puckering of macula, right eye: Secondary | ICD-10-CM | POA: Diagnosis not present

## 2019-03-23 DIAGNOSIS — H59033 Cystoid macular edema following cataract surgery, bilateral: Secondary | ICD-10-CM

## 2019-03-23 DIAGNOSIS — H353112 Nonexudative age-related macular degeneration, right eye, intermediate dry stage: Secondary | ICD-10-CM | POA: Diagnosis not present

## 2019-03-23 DIAGNOSIS — H35033 Hypertensive retinopathy, bilateral: Secondary | ICD-10-CM

## 2019-03-23 DIAGNOSIS — I10 Essential (primary) hypertension: Secondary | ICD-10-CM

## 2019-04-26 DIAGNOSIS — D485 Neoplasm of uncertain behavior of skin: Secondary | ICD-10-CM | POA: Diagnosis not present

## 2019-04-26 DIAGNOSIS — Z85828 Personal history of other malignant neoplasm of skin: Secondary | ICD-10-CM | POA: Diagnosis not present

## 2019-04-26 DIAGNOSIS — C44619 Basal cell carcinoma of skin of left upper limb, including shoulder: Secondary | ICD-10-CM | POA: Diagnosis not present

## 2019-04-26 DIAGNOSIS — L570 Actinic keratosis: Secondary | ICD-10-CM | POA: Diagnosis not present

## 2019-04-26 DIAGNOSIS — L719 Rosacea, unspecified: Secondary | ICD-10-CM | POA: Diagnosis not present

## 2019-05-04 ENCOUNTER — Other Ambulatory Visit: Payer: Self-pay

## 2019-05-04 ENCOUNTER — Encounter (INDEPENDENT_AMBULATORY_CARE_PROVIDER_SITE_OTHER): Payer: Medicare Other | Admitting: Ophthalmology

## 2019-05-04 DIAGNOSIS — H35033 Hypertensive retinopathy, bilateral: Secondary | ICD-10-CM | POA: Diagnosis not present

## 2019-05-04 DIAGNOSIS — H59031 Cystoid macular edema following cataract surgery, right eye: Secondary | ICD-10-CM | POA: Diagnosis not present

## 2019-05-04 DIAGNOSIS — I10 Essential (primary) hypertension: Secondary | ICD-10-CM

## 2019-05-04 DIAGNOSIS — H35371 Puckering of macula, right eye: Secondary | ICD-10-CM

## 2019-05-04 DIAGNOSIS — H43813 Vitreous degeneration, bilateral: Secondary | ICD-10-CM

## 2019-06-15 ENCOUNTER — Other Ambulatory Visit: Payer: Self-pay

## 2019-06-15 ENCOUNTER — Encounter (INDEPENDENT_AMBULATORY_CARE_PROVIDER_SITE_OTHER): Payer: Medicare Other | Admitting: Ophthalmology

## 2019-06-15 DIAGNOSIS — I10 Essential (primary) hypertension: Secondary | ICD-10-CM

## 2019-06-15 DIAGNOSIS — H35033 Hypertensive retinopathy, bilateral: Secondary | ICD-10-CM

## 2019-06-15 DIAGNOSIS — H43813 Vitreous degeneration, bilateral: Secondary | ICD-10-CM | POA: Diagnosis not present

## 2019-06-15 DIAGNOSIS — H59031 Cystoid macular edema following cataract surgery, right eye: Secondary | ICD-10-CM

## 2019-07-27 ENCOUNTER — Other Ambulatory Visit: Payer: Self-pay

## 2019-07-27 ENCOUNTER — Encounter (INDEPENDENT_AMBULATORY_CARE_PROVIDER_SITE_OTHER): Payer: Medicare Other | Admitting: Ophthalmology

## 2019-07-27 DIAGNOSIS — H59031 Cystoid macular edema following cataract surgery, right eye: Secondary | ICD-10-CM

## 2019-07-27 DIAGNOSIS — H43813 Vitreous degeneration, bilateral: Secondary | ICD-10-CM

## 2019-07-27 DIAGNOSIS — H35371 Puckering of macula, right eye: Secondary | ICD-10-CM

## 2019-07-27 DIAGNOSIS — H35033 Hypertensive retinopathy, bilateral: Secondary | ICD-10-CM | POA: Diagnosis not present

## 2019-07-27 DIAGNOSIS — I10 Essential (primary) hypertension: Secondary | ICD-10-CM | POA: Diagnosis not present

## 2019-08-03 DIAGNOSIS — Z23 Encounter for immunization: Secondary | ICD-10-CM | POA: Diagnosis not present

## 2019-08-16 DIAGNOSIS — R5383 Other fatigue: Secondary | ICD-10-CM | POA: Diagnosis not present

## 2019-08-16 DIAGNOSIS — E7800 Pure hypercholesterolemia, unspecified: Secondary | ICD-10-CM | POA: Diagnosis not present

## 2019-08-16 DIAGNOSIS — E782 Mixed hyperlipidemia: Secondary | ICD-10-CM | POA: Diagnosis not present

## 2019-08-16 DIAGNOSIS — C61 Malignant neoplasm of prostate: Secondary | ICD-10-CM | POA: Diagnosis not present

## 2019-08-16 DIAGNOSIS — I10 Essential (primary) hypertension: Secondary | ICD-10-CM | POA: Diagnosis not present

## 2019-08-16 DIAGNOSIS — K2100 Gastro-esophageal reflux disease with esophagitis, without bleeding: Secondary | ICD-10-CM | POA: Diagnosis not present

## 2019-08-25 DIAGNOSIS — I4891 Unspecified atrial fibrillation: Secondary | ICD-10-CM | POA: Diagnosis not present

## 2019-08-25 DIAGNOSIS — Z6833 Body mass index (BMI) 33.0-33.9, adult: Secondary | ICD-10-CM | POA: Diagnosis not present

## 2019-08-25 DIAGNOSIS — I10 Essential (primary) hypertension: Secondary | ICD-10-CM | POA: Diagnosis not present

## 2019-08-25 DIAGNOSIS — E782 Mixed hyperlipidemia: Secondary | ICD-10-CM | POA: Diagnosis not present

## 2019-08-25 DIAGNOSIS — Z0001 Encounter for general adult medical examination with abnormal findings: Secondary | ICD-10-CM | POA: Diagnosis not present

## 2019-08-25 DIAGNOSIS — F411 Generalized anxiety disorder: Secondary | ICD-10-CM | POA: Diagnosis not present

## 2019-08-31 DIAGNOSIS — Z23 Encounter for immunization: Secondary | ICD-10-CM | POA: Diagnosis not present

## 2019-10-27 ENCOUNTER — Other Ambulatory Visit: Payer: Self-pay

## 2019-10-27 ENCOUNTER — Encounter (INDEPENDENT_AMBULATORY_CARE_PROVIDER_SITE_OTHER): Payer: Medicare Other | Admitting: Ophthalmology

## 2019-10-27 DIAGNOSIS — H59031 Cystoid macular edema following cataract surgery, right eye: Secondary | ICD-10-CM

## 2019-10-27 DIAGNOSIS — I10 Essential (primary) hypertension: Secondary | ICD-10-CM | POA: Diagnosis not present

## 2019-10-27 DIAGNOSIS — H35371 Puckering of macula, right eye: Secondary | ICD-10-CM | POA: Diagnosis not present

## 2019-10-27 DIAGNOSIS — H35033 Hypertensive retinopathy, bilateral: Secondary | ICD-10-CM | POA: Diagnosis not present

## 2019-10-27 DIAGNOSIS — H43813 Vitreous degeneration, bilateral: Secondary | ICD-10-CM

## 2019-12-12 DIAGNOSIS — Z6832 Body mass index (BMI) 32.0-32.9, adult: Secondary | ICD-10-CM | POA: Diagnosis not present

## 2019-12-12 DIAGNOSIS — J0101 Acute recurrent maxillary sinusitis: Secondary | ICD-10-CM | POA: Diagnosis not present

## 2019-12-12 DIAGNOSIS — H9190 Unspecified hearing loss, unspecified ear: Secondary | ICD-10-CM | POA: Diagnosis not present

## 2019-12-12 DIAGNOSIS — H6591 Unspecified nonsuppurative otitis media, right ear: Secondary | ICD-10-CM | POA: Diagnosis not present

## 2020-01-27 ENCOUNTER — Encounter (INDEPENDENT_AMBULATORY_CARE_PROVIDER_SITE_OTHER): Payer: Medicare Other | Admitting: Ophthalmology

## 2020-01-27 ENCOUNTER — Other Ambulatory Visit: Payer: Self-pay

## 2020-01-27 DIAGNOSIS — H35033 Hypertensive retinopathy, bilateral: Secondary | ICD-10-CM

## 2020-01-27 DIAGNOSIS — H35341 Macular cyst, hole, or pseudohole, right eye: Secondary | ICD-10-CM | POA: Diagnosis not present

## 2020-01-27 DIAGNOSIS — H35371 Puckering of macula, right eye: Secondary | ICD-10-CM

## 2020-01-27 DIAGNOSIS — I10 Essential (primary) hypertension: Secondary | ICD-10-CM | POA: Diagnosis not present

## 2020-01-27 DIAGNOSIS — H43813 Vitreous degeneration, bilateral: Secondary | ICD-10-CM

## 2020-01-27 DIAGNOSIS — H59031 Cystoid macular edema following cataract surgery, right eye: Secondary | ICD-10-CM

## 2020-02-20 DIAGNOSIS — R5383 Other fatigue: Secondary | ICD-10-CM | POA: Diagnosis not present

## 2020-02-20 DIAGNOSIS — K2100 Gastro-esophageal reflux disease with esophagitis, without bleeding: Secondary | ICD-10-CM | POA: Diagnosis not present

## 2020-02-20 DIAGNOSIS — I10 Essential (primary) hypertension: Secondary | ICD-10-CM | POA: Diagnosis not present

## 2020-02-20 DIAGNOSIS — E782 Mixed hyperlipidemia: Secondary | ICD-10-CM | POA: Diagnosis not present

## 2020-02-23 DIAGNOSIS — Z6833 Body mass index (BMI) 33.0-33.9, adult: Secondary | ICD-10-CM | POA: Diagnosis not present

## 2020-02-23 DIAGNOSIS — F411 Generalized anxiety disorder: Secondary | ICD-10-CM | POA: Diagnosis not present

## 2020-02-23 DIAGNOSIS — F4321 Adjustment disorder with depressed mood: Secondary | ICD-10-CM | POA: Diagnosis not present

## 2020-02-23 DIAGNOSIS — Z23 Encounter for immunization: Secondary | ICD-10-CM | POA: Diagnosis not present

## 2020-02-23 DIAGNOSIS — I10 Essential (primary) hypertension: Secondary | ICD-10-CM | POA: Diagnosis not present

## 2020-02-23 DIAGNOSIS — K2100 Gastro-esophageal reflux disease with esophagitis, without bleeding: Secondary | ICD-10-CM | POA: Diagnosis not present

## 2020-02-23 DIAGNOSIS — E782 Mixed hyperlipidemia: Secondary | ICD-10-CM | POA: Diagnosis not present

## 2020-04-03 DIAGNOSIS — Z23 Encounter for immunization: Secondary | ICD-10-CM | POA: Diagnosis not present

## 2020-04-19 DIAGNOSIS — N50819 Testicular pain, unspecified: Secondary | ICD-10-CM | POA: Diagnosis not present

## 2020-04-19 DIAGNOSIS — Z6832 Body mass index (BMI) 32.0-32.9, adult: Secondary | ICD-10-CM | POA: Diagnosis not present

## 2020-04-19 DIAGNOSIS — R319 Hematuria, unspecified: Secondary | ICD-10-CM | POA: Diagnosis not present

## 2020-05-22 DIAGNOSIS — D1801 Hemangioma of skin and subcutaneous tissue: Secondary | ICD-10-CM | POA: Diagnosis not present

## 2020-05-22 DIAGNOSIS — Z85828 Personal history of other malignant neoplasm of skin: Secondary | ICD-10-CM | POA: Diagnosis not present

## 2020-05-22 DIAGNOSIS — L821 Other seborrheic keratosis: Secondary | ICD-10-CM | POA: Diagnosis not present

## 2020-05-22 DIAGNOSIS — L570 Actinic keratosis: Secondary | ICD-10-CM | POA: Diagnosis not present

## 2020-05-30 ENCOUNTER — Other Ambulatory Visit: Payer: Self-pay

## 2020-05-30 ENCOUNTER — Encounter (INDEPENDENT_AMBULATORY_CARE_PROVIDER_SITE_OTHER): Payer: Medicare Other | Admitting: Ophthalmology

## 2020-05-30 DIAGNOSIS — H35033 Hypertensive retinopathy, bilateral: Secondary | ICD-10-CM | POA: Diagnosis not present

## 2020-05-30 DIAGNOSIS — H35371 Puckering of macula, right eye: Secondary | ICD-10-CM

## 2020-05-30 DIAGNOSIS — I10 Essential (primary) hypertension: Secondary | ICD-10-CM

## 2020-05-30 DIAGNOSIS — H59031 Cystoid macular edema following cataract surgery, right eye: Secondary | ICD-10-CM

## 2020-05-30 DIAGNOSIS — H43813 Vitreous degeneration, bilateral: Secondary | ICD-10-CM

## 2020-06-12 ENCOUNTER — Other Ambulatory Visit: Payer: Self-pay

## 2020-06-12 ENCOUNTER — Encounter: Payer: Self-pay | Admitting: Urology

## 2020-06-12 ENCOUNTER — Ambulatory Visit (INDEPENDENT_AMBULATORY_CARE_PROVIDER_SITE_OTHER): Payer: Medicare Other | Admitting: Urology

## 2020-06-12 VITALS — BP 127/75 | HR 92 | Temp 98.3°F | Ht 68.0 in | Wt 209.0 lb

## 2020-06-12 DIAGNOSIS — R310 Gross hematuria: Secondary | ICD-10-CM | POA: Diagnosis not present

## 2020-06-12 LAB — URINALYSIS, ROUTINE W REFLEX MICROSCOPIC
Bilirubin, UA: NEGATIVE
Glucose, UA: NEGATIVE
Ketones, UA: NEGATIVE
Leukocytes,UA: NEGATIVE
Nitrite, UA: NEGATIVE
Protein,UA: NEGATIVE
Specific Gravity, UA: <1.005 — ABNORMAL LOW (ref 1.005–1.030)
Urobilinogen, Ur: 0.2 mg/dL (ref 0.2–1.0)
pH, UA: 6 (ref 5.0–7.5)

## 2020-06-12 LAB — MICROSCOPIC EXAMINATION
Bacteria, UA: NONE SEEN
Epithelial Cells (non renal): NONE SEEN /hpf (ref 0–10)
Renal Epithel, UA: NONE SEEN /hpf
WBC, UA: NONE SEEN /hpf (ref 0–5)

## 2020-06-12 NOTE — Progress Notes (Signed)
H&P  Chief Complaint: Gross hematuria  History of Present Illness: 70 year old male presents with intermittent gross painless hematuria, noted to have clots at the beginning of his urinary stream.  This is happened several times in the past several weeks.  He does have a history of prostate cancer, having had a radical prostatectomy in 2000 by Dr. Rana Snare of [facility].  He subsequently had adjuvant postoperative radiotherapy.  He normally has a good stream.  He has a small amount of leakage.  His PSAs have, by history, been unremarkable/undetectable/stable since his radiation has been completed.  His last visit with Dr. Risa Grill was several years ago.  He denies any associated abdominal or flank pain with his hematuria.  Past Medical History:  Diagnosis Date  . Cancer ([place])   . GERD (gastroesophageal reflux disease)     Past Surgical History:  Procedure Laterality Date  . BACK SURGERY     x2  . CERVICAL LAMINECTOMY    . INSERTION OF MESH N/A 11/23/2013   Procedure: INSERTION OF MESH;  Surgeon: Jamesetta So, MD;  Location: [facility];  Service: General;  Laterality: N/A;  . KNEE ARTHROSCOPY Left   . ORIF FOOT FRACTURE Right   . PROSTATECTOMY     [place]  . ROTATOR CUFF REPAIR Right   . THUMB AMPUTATION Left   . TYMPANOPLASTY Left 02/22/2018   Procedure: TYMPANOPLASTY;  Surgeon: [name], MD;  Location: [place];  Service: ENT;  Laterality: Left;  . UMBILICAL HERNIA REPAIR N/A 11/23/2013   Procedure: UMBILICAL HERNIORRHAPHY;  Surgeon: Jamesetta So, MD;  Location: [facility];  Service: General;  Laterality: N/A;    Home Medications:  Allergies as of 06/12/2020      Reactions   Morphine And Related Anaphylaxis   Percocet [oxycodone-acetaminophen]    Itchy/jumpy       Medication List       Accurate as of June 12, 2020 11:15 AM. If you have any questions, ask your nurse or doctor.        STOP taking these medications   atorvastatin 20 MG  tablet Commonly known as: LIPITOR Stopped by: Jorja Loa, MD   Azelastine HCl 0.15 % Soln Stopped by: Jorja Loa, MD   Azelastine-Fluticasone [phone] MCG/ACT Susp Stopped by: Jorja Loa, MD   azithromycin 250 MG tablet Commonly known as: Zithromax Z-Pak Stopped by: Jorja Loa, MD   cetirizine 10 MG tablet Commonly known as: ZYRTEC Stopped by: Jorja Loa, MD   fluticasone 50 MCG/ACT nasal spray Commonly known as: Flonase Stopped by: Jorja Loa, MD   lisinopril 20 MG tablet Commonly known as: ZESTRIL Stopped by: Jorja Loa, MD   losartan 25 MG tablet Commonly known as: COZAAR Stopped by: Jorja Loa, MD     TAKE these medications   ALPRAZolam 0.5 MG tablet Commonly known as: XANAX Take 0.5 mg by mouth 3 (three) times daily as needed. for anxiety   GOODY HEADACHE PO Take 1 packet by mouth daily as needed (headache/pain).   Loteprednol Etabonate 0.5 % Gel   pantoprazole 40 MG tablet Commonly known as: PROTONIX Take 40 mg by mouth daily.   Prolensa 0.07 % Soln Generic drug: Bromfenac Sodium Apply to eye.       Allergies:  Allergies  Allergen Reactions  . Morphine And Related Anaphylaxis  . Percocet [Oxycodone-Acetaminophen]     Itchy/jumpy     Family History  Problem Relation Age of  Onset  . Alzheimer's disease Mother   . Alzheimer's disease Father   . Prostate cancer Father   . Alzheimer's disease Paternal Grandfather   . Allergic rhinitis Neg Hx   . Asthma Neg Hx   . Eczema Neg Hx   . Immunodeficiency Neg Hx   . Urticaria Neg Hx     Social History:  reports that he quit smoking about 16 years ago. His smoking use included cigarettes. He has a 5.00 pack-year smoking history. He has never used smokeless tobacco. He reports that he does not drink alcohol and does not use drugs.  ROS: A complete review of systems was performed.  All systems are negative except for pertinent  findings as noted.  Physical Exam:  Vital signs in last 24 hours: BP 127/75   Pulse 92   Temp 98.3 F (36.8 C)   Ht 5\' 8"  (1.727 m)   Wt 209 lb (94.8 kg)   BMI 31.78 kg/m  Constitutional:  Alert and oriented, No acute distress Cardiovascular: Regular rate  Respiratory: Normal respiratory effort GI: Abdomen is soft, nontender, nondistended, no abdominal masses. No CVAT.  No inguinal hernias.  Abdomen is obese. Genitourinary: Normal male phallus, testes are descended bilaterally and non-tender and without masses, scrotum is normal in appearance without lesions or masses, perineum is normal on inspection. Lymphatic: No lymphadenopathy Neurologic: Grossly intact, no focal deficits Psychiatric: Normal mood and affect  I have reviewed prior pt notes  I have reviewed notes from referring/previous physicians  I have reviewed urinalysis results  I have independently reviewed prior imaging  I have reviewed prior PSA results    Impression/Assessment:  Intermittent gross painless hematuria/clots.  Patient does have a history of prostate cancer and radiotherapy.  Plan:  1.  I will check BUN and creatinine today  2.  I will have him come back following hematuria CT for cystoscopy

## 2020-06-12 NOTE — Progress Notes (Signed)
Urological Symptom Review  Patient is experiencing the following symptoms: Frequent urination Get up at night to urinate Leakage of urine Stream starts and stops Blood in urine Erection problems (male only)   Review of Systems  Gastrointestinal (upper)  : Negative for upper GI symptoms  Gastrointestinal (lower) : Diarrhea Constipation  Constitutional : Negative for symptoms  Skin: Negative for skin symptoms  Eyes: Negative for eye symptoms  Ear/Nose/Throat : Sinus problems  Hematologic/Lymphatic: Negative for Hematologic/Lymphatic symptoms  Cardiovascular : Negative for cardiovascular symptoms  Respiratory : Negative for respiratory symptoms  Endocrine: Negative for endocrine symptoms  Musculoskeletal: Back pain Joint pain  Neurological: Negative for neurological symptoms  Psychologic: Depression

## 2020-06-13 LAB — BUN+CREAT
BUN/Creatinine Ratio: 7 — ABNORMAL LOW (ref 10–24)
BUN: 7 mg/dL — ABNORMAL LOW (ref 8–27)
Creatinine, Ser: 1.04 mg/dL (ref 0.76–1.27)
GFR calc Af Amer: 84 mL/min/{1.73_m2} (ref >59)
GFR calc non Af Amer: 73 mL/min/{1.73_m2} (ref >59)

## 2020-06-25 ENCOUNTER — Telehealth: Payer: Self-pay

## 2020-06-25 NOTE — Telephone Encounter (Signed)
Called CT dept at [facility] and let them know pts kidney function was normal per Dr. Diona Fanti.

## 2020-07-03 ENCOUNTER — Ambulatory Visit: Payer: Medicare Other | Admitting: Urology

## 2020-07-06 ENCOUNTER — Encounter (HOSPITAL_COMMUNITY): Payer: Self-pay

## 2020-07-06 ENCOUNTER — Other Ambulatory Visit: Payer: Self-pay

## 2020-07-06 ENCOUNTER — Ambulatory Visit (HOSPITAL_COMMUNITY)
Admission: RE | Admit: 2020-07-06 | Discharge: 2020-07-06 | Disposition: A | Discharge status: Home / Self Care | Payer: Medicare Other | Source: Ambulatory Visit | Attending: Urology | Admitting: Urology

## 2020-07-06 DIAGNOSIS — R310 Gross hematuria: Secondary | ICD-10-CM | POA: Diagnosis not present

## 2020-07-06 DIAGNOSIS — R319 Hematuria, unspecified: Secondary | ICD-10-CM | POA: Diagnosis not present

## 2020-07-06 DIAGNOSIS — I700 Atherosclerosis of aorta: Secondary | ICD-10-CM | POA: Diagnosis not present

## 2020-07-06 MED ORDER — IOHEXOL 300 MG/ML  SOLN
125.0000 mL | Freq: Once | INTRAMUSCULAR | Status: AC | PRN
  Administered 2020-07-06: 125 mL via INTRAVENOUS

## 2020-07-10 ENCOUNTER — Telehealth: Payer: Self-pay

## 2020-07-10 NOTE — Telephone Encounter (Signed)
-----   Message from Franchot Gallo, MD sent at 07/09/2020  8:05 PM EST ----- Notify pt thst CT looked fine--nothing showing up that looks to be the source of bleeding--keep sched'd followup ----- Message ----- From: Valentina Lucks, LPN Sent: 09/20/6977  12:52 PM EST To: Franchot Gallo, MD  Pls review.

## 2020-07-10 NOTE — Telephone Encounter (Signed)
-----   Message from Franchot Gallo, MD sent at 07/09/2020  8:05 PM EST ----- Notify pt thst CT looked fine--nothing showing up that looks to be the source of bleeding--keep sched'd followup ----- Message ----- From: Valentina Lucks, LPN Sent: 1/80/9704  12:52 PM EST To: Franchot Gallo, MD  Pls review.

## 2020-07-10 NOTE — Telephone Encounter (Signed)
Left message on both pts numbers listed that CT came back normal. Nothing to show source of bleeding and to follow with next appointment as planned.

## 2020-07-10 NOTE — Telephone Encounter (Signed)
Patient called and made aware.

## 2020-07-30 NOTE — Progress Notes (Signed)
History of Present Illness: Here for cysto following CT for hematuria. CT hematuria protocol performed on 2.`18.2022: Results-- 1. No evidence of urinary tract calculus, mass, filling defect, or hydronephrosis. 2. Status post prostatectomy. 3. Coronary artery disease.    Past Medical History:  Diagnosis Date  . Cancer ([place])   . GERD (gastroesophageal reflux disease)     Past Surgical History:  Procedure Laterality Date  . BACK SURGERY     x2  . CERVICAL LAMINECTOMY    . INSERTION OF MESH N/A 11/23/2013   Procedure: INSERTION OF MESH;  Surgeon: Jamesetta So, MD;  Location: [facility];  Service: General;  Laterality: N/A;  . KNEE ARTHROSCOPY Left   . ORIF FOOT FRACTURE Right   . PROSTATECTOMY     [place]  . ROTATOR CUFF REPAIR Right   . THUMB AMPUTATION Left   . TYMPANOPLASTY Left 02/22/2018   Procedure: TYMPANOPLASTY;  Surgeon: [name], MD;  Location: [place];  Service: ENT;  Laterality: Left;  . UMBILICAL HERNIA REPAIR N/A 11/23/2013   Procedure: UMBILICAL HERNIORRHAPHY;  Surgeon: Jamesetta So, MD;  Location: [facility];  Service: General;  Laterality: N/A;    Home Medications:  Allergies as of 07/31/2020      Reactions   Morphine And Related Anaphylaxis   Percocet [oxycodone-acetaminophen]    Itchy/jumpy       Medication List       Accurate as of July 30, 2020  9:01 PM. If you have any questions, ask your nurse or doctor.        ALPRAZolam 0.5 MG tablet Commonly known as: XANAX Take 0.5 mg by mouth 3 (three) times daily as needed. for anxiety   GOODY HEADACHE PO Take 1 packet by mouth daily as needed (headache/pain).   Loteprednol Etabonate 0.5 % Gel   pantoprazole 40 MG tablet Commonly known as: PROTONIX Take 40 mg by mouth daily.   Prolensa 0.07 % Soln Generic drug: Bromfenac Sodium Apply to eye.       Allergies:  Allergies  Allergen Reactions  . Morphine And Related Anaphylaxis  . Percocet [Oxycodone-Acetaminophen]      Itchy/jumpy     Family History  Problem Relation Age of Onset  . Alzheimer's disease Mother   . Alzheimer's disease Father   . Prostate cancer Father   . Alzheimer's disease Paternal Grandfather   . Allergic rhinitis Neg Hx   . Asthma Neg Hx   . Eczema Neg Hx   . Immunodeficiency Neg Hx   . Urticaria Neg Hx     Social History:  reports that he quit smoking about 16 years ago. His smoking use included cigarettes. He has a 5.00 pack-year smoking history. He has never used smokeless tobacco. He reports that he does not drink alcohol and does not use drugs.  ROS: A complete review of systems was performed.  All systems are negative except for pertinent findings as noted.  t  I have reviewed prior pt notes  I have reviewed notes from referring/previous physicians  I have reviewed urinalysis results  I have independently reviewed prior imaging  I have reviewed prior PSA results  I have reviewed prior urine culture  Cystoscopy Procedure Note:  Indication: Gross hematuria, history of prostate cancer treated with radical prostatectomy and salvage radiation  After informed consent and discussion of the procedure and its risks, HAYK DIVIS was positioned and prepped in the standard fashion.  Cystoscopy was performed with a flexible cystoscope.  Findings: Urethra: No urethral lesions.  Mild meatal stenosis, easily passed with scope Prostate: Absent Bladder neck: Mild contracture Ureteral orifices: Normal Bladder: Mild to telangiectatic vessels at the base of the bladder.  No urothelial lesions noted.  The patient tolerated the procedure well.     Impression/Assessment:  1.  History of prostate cancer, years out from prostatectomy/salvage radiation without evidence of recurrence  2.  Gross hematuria, most likely related to radiation induced change, resolved  3.  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  I reassured him about his  examination  2.  Cystoscopy covered with Cipro today  3.  I will have him come back as needed recurrent gross hematuria

## 2020-07-31 ENCOUNTER — Ambulatory Visit (INDEPENDENT_AMBULATORY_CARE_PROVIDER_SITE_OTHER): Payer: Medicare Other | Admitting: Urology

## 2020-07-31 ENCOUNTER — Encounter: Payer: Self-pay | Admitting: Urology

## 2020-07-31 ENCOUNTER — Other Ambulatory Visit: Payer: Self-pay

## 2020-07-31 VITALS — BP 122/77 | HR 91 | Temp 98.3°F | Ht 68.0 in | Wt 210.0 lb

## 2020-07-31 DIAGNOSIS — Z8546 Personal history of malignant neoplasm of prostate: Secondary | ICD-10-CM | POA: Diagnosis not present

## 2020-07-31 DIAGNOSIS — R310 Gross hematuria: Secondary | ICD-10-CM

## 2020-07-31 DIAGNOSIS — N320 Bladder-neck obstruction: Secondary | ICD-10-CM | POA: Diagnosis not present

## 2020-07-31 LAB — URINALYSIS, ROUTINE W REFLEX MICROSCOPIC
Bilirubin, UA: NEGATIVE
Glucose, UA: NEGATIVE
Ketones, UA: NEGATIVE
Leukocytes,UA: NEGATIVE
Nitrite, UA: NEGATIVE
Protein,UA: NEGATIVE
Specific Gravity, UA: 1.015 (ref 1.005–1.030)
Urobilinogen, Ur: 0.2 mg/dL (ref 0.2–1.0)
pH, UA: 7 (ref 5.0–7.5)

## 2020-07-31 LAB — MICROSCOPIC EXAMINATION
Bacteria, UA: NONE SEEN
Epithelial Cells (non renal): NONE SEEN /hpf (ref 0–10)
Renal Epithel, UA: NONE SEEN /hpf
WBC, UA: NONE SEEN /hpf (ref 0–5)

## 2020-07-31 MED ORDER — CIPROFLOXACIN HCL 500 MG PO TABS
500.0000 mg | ORAL_TABLET | Freq: Once | ORAL | Status: AC
  Administered 2020-07-31: 500 mg via ORAL

## 2020-07-31 NOTE — Addendum Note (Signed)
Addended by: Valentina Lucks on: 07/31/2020 12:21 PM   Modules accepted: Orders

## 2020-07-31 NOTE — Progress Notes (Signed)
Urological Symptom Review  Patient is experiencing the following symptoms: Frequent urination Leakage of urine Stream starts and stops Blood in urine   Review of Systems  Gastrointestinal (upper)  : Negative for upper GI symptoms  Gastrointestinal (lower) : Constipation  Constitutional : Negative for symptoms  Skin: Negative for skin symptoms  Eyes: Negative for eye symptoms  Ear/Nose/Throat : Sinus problems  Hematologic/Lymphatic: Negative for Hematologic/Lymphatic symptoms  Cardiovascular : Negative for cardiovascular symptoms  Respiratory : Negative for respiratory symptoms  Endocrine: Negative for endocrine symptoms  Musculoskeletal: Back pain Joint pain  Neurological: Negative for neurological symptoms  Psychologic: Negative for psychiatric symptoms

## 2020-08-17 DIAGNOSIS — K2100 Gastro-esophageal reflux disease with esophagitis, without bleeding: Secondary | ICD-10-CM | POA: Diagnosis not present

## 2020-08-17 DIAGNOSIS — I10 Essential (primary) hypertension: Secondary | ICD-10-CM | POA: Diagnosis not present

## 2020-08-17 DIAGNOSIS — E7849 Other hyperlipidemia: Secondary | ICD-10-CM | POA: Diagnosis not present

## 2020-08-17 DIAGNOSIS — R5383 Other fatigue: Secondary | ICD-10-CM | POA: Diagnosis not present

## 2020-08-17 DIAGNOSIS — E782 Mixed hyperlipidemia: Secondary | ICD-10-CM | POA: Diagnosis not present

## 2020-08-22 DIAGNOSIS — Z23 Encounter for immunization: Secondary | ICD-10-CM | POA: Diagnosis not present

## 2020-08-22 DIAGNOSIS — F411 Generalized anxiety disorder: Secondary | ICD-10-CM | POA: Diagnosis not present

## 2020-08-22 DIAGNOSIS — K2100 Gastro-esophageal reflux disease with esophagitis, without bleeding: Secondary | ICD-10-CM | POA: Diagnosis not present

## 2020-08-22 DIAGNOSIS — I10 Essential (primary) hypertension: Secondary | ICD-10-CM | POA: Diagnosis not present

## 2020-08-22 DIAGNOSIS — E7849 Other hyperlipidemia: Secondary | ICD-10-CM | POA: Diagnosis not present

## 2020-08-22 DIAGNOSIS — Z6833 Body mass index (BMI) 33.0-33.9, adult: Secondary | ICD-10-CM | POA: Diagnosis not present

## 2020-08-22 DIAGNOSIS — F4321 Adjustment disorder with depressed mood: Secondary | ICD-10-CM | POA: Diagnosis not present

## 2020-08-24 DIAGNOSIS — M170 Bilateral primary osteoarthritis of knee: Secondary | ICD-10-CM | POA: Diagnosis not present

## 2020-09-04 DIAGNOSIS — R7303 Prediabetes: Secondary | ICD-10-CM | POA: Diagnosis not present

## 2020-09-04 DIAGNOSIS — F418 Other specified anxiety disorders: Secondary | ICD-10-CM | POA: Diagnosis not present

## 2020-09-04 DIAGNOSIS — I10 Essential (primary) hypertension: Secondary | ICD-10-CM | POA: Diagnosis not present

## 2020-09-04 DIAGNOSIS — I4891 Unspecified atrial fibrillation: Secondary | ICD-10-CM | POA: Diagnosis not present

## 2020-09-04 DIAGNOSIS — M1710 Unilateral primary osteoarthritis, unspecified knee: Secondary | ICD-10-CM | POA: Diagnosis not present

## 2020-09-04 DIAGNOSIS — E7849 Other hyperlipidemia: Secondary | ICD-10-CM | POA: Diagnosis not present

## 2020-09-04 DIAGNOSIS — E782 Mixed hyperlipidemia: Secondary | ICD-10-CM | POA: Diagnosis not present

## 2020-09-04 DIAGNOSIS — Z6833 Body mass index (BMI) 33.0-33.9, adult: Secondary | ICD-10-CM | POA: Diagnosis not present

## 2020-09-04 DIAGNOSIS — Z8679 Personal history of other diseases of the circulatory system: Secondary | ICD-10-CM | POA: Diagnosis not present

## 2020-09-11 DIAGNOSIS — H43811 Vitreous degeneration, right eye: Secondary | ICD-10-CM | POA: Diagnosis not present

## 2020-09-12 DIAGNOSIS — M1712 Unilateral primary osteoarthritis, left knee: Secondary | ICD-10-CM | POA: Diagnosis not present

## 2020-09-17 ENCOUNTER — Encounter (HOSPITAL_COMMUNITY): Admission: RE | Admit: 2020-09-17 | Payer: Medicare Other | Source: Ambulatory Visit

## 2020-09-20 ENCOUNTER — Encounter (HOSPITAL_COMMUNITY): Payer: Self-pay

## 2020-09-20 NOTE — Patient Instructions (Addendum)
DUE TO COVID-19 ONLY ONE VISITOR IS ALLOWED TO COME WITH YOU AND STAY IN THE WAITING ROOM ONLY DURING PRE OP AND PROCEDURE.   **NO VISITORS ARE ALLOWED IN THE SHORT STAY AREA OR RECOVERY ROOM!!**  IF YOU WILL BE ADMITTED INTO THE HOSPITAL YOU ARE ALLOWED ONLY TWO SUPPORT PEOPLE DURING VISITATION HOURS ONLY (10AM -8PM)   . The support person(s) may change daily. . The support person(s) must pass our screening, gel in and out, and wear a mask at all times, including in the patient's room. . Patients must also wear a mask when staff or their support person are in the room.  No visitors under the age of 70. Any visitor under the age of 38 must be accompanied by an adult.    COVID SWAB TESTING MUST BE COMPLETED ON:  Friday, 09-21-20 @ 12:10 PM   [address] [place],  [address]  (Must self quarantine after testing. Follow instructions on handout.)       Your procedure is scheduled on: Tuesday, 09-25-20   Report to [facility] Main  Entrance   Report to admitting at 7:30 AM   Call this number if you have problems the morning of surgery [phone]   Do not eat food :After Midnight.   May have liquids until 7:00 AM  day of surgery  CLEAR LIQUID DIET  Foods Allowed                                                                     Foods Excluded  Water, Black Coffee and tea, regular and decaf               liquids that you cannot  Plain Jell-O in any flavor  (No red)                                      see through such as: Fruit ices (not with fruit pulp)                                      milk, soups, orange juice              Iced Popsicles (No red)                                      All solid food                                   Apple juices Sports drinks like Gatorade (No red) Lightly seasoned clear broth or consume(fat free) Sugar, honey syrup      Complete one Ensure drink the morning of surgery at 7:00 AM  the day of surgery.     1. The day of  surgery:  ? Drink ONE (1) Pre-Surgery Clear Ensure or G2 by am the morning of surgery. Drink in one sitting. Do not sip.  ? This drink was  given to you during your hospital  pre-op appointment visit. ? Nothing else to drink after completing the  Pre-Surgery Clear Ensure or G2.          If you have questions, please contact your surgeon's office.     Oral Hygiene is also important to reduce your risk of infection.                                    Remember - BRUSH YOUR TEETH THE MORNING OF SURGERY WITH YOUR REGULAR TOOTHPASTE   Do NOT smoke after Midnight   Take these medicines the morning of surgery with A SIP OF WATER:  Pantoprazole, Alprazolam if needed                               You may not have any metal on your body including  jewelry, and body piercings             Do not wear  lotions, powders, cologne, or deodorant             Men may shave face and neck.   Do not bring valuables to the hospital. [place].   Contacts, dentures or bridgework may not be worn into surgery.   Bring small overnight bag day of surgery.                Please read over the following fact sheets you were given: IF YOU HAVE QUESTIONS ABOUT YOUR PRE OP INSTRUCTIONS PLEASE CALL  [facility] - Preparing for Surgery Before surgery, you can play an important role.  Because skin is not sterile, your skin needs to be as free of germs as possible.  You can reduce the number of germs on your skin by washing with CHG (chlorahexidine gluconate) soap before surgery.  CHG is an antiseptic cleaner which kills germs and bonds with the skin to continue killing germs even after washing. Please DO NOT use if you have an allergy to CHG or antibacterial soaps.  If your skin becomes reddened/irritated stop using the CHG and inform your nurse when you arrive at Short Stay. Do not shave (including legs and underarms) for at least 48 hours prior to the first CHG  shower.  You may shave your face/neck.  Please follow these instructions carefully:  1.  Shower with CHG Soap the night before surgery and the  morning of surgery.  2.  If you choose to wash your hair, wash your hair first as usual with your normal  shampoo.  3.  After you shampoo, rinse your hair and body thoroughly to remove the shampoo.                             4.  Use CHG as you would any other liquid soap.  You can apply chg directly to the skin and wash.  Gently with a scrungie or clean washcloth.  5.  Apply the CHG Soap to your body ONLY FROM THE NECK DOWN.   Do   not use on face/ open                           Wound or open sores. Avoid  contact with eyes, ears mouth and   genitals (private parts).                       Wash face,  Genitals (private parts) with your normal soap.             6.  Wash thoroughly, paying special attention to the area where your    surgery  will be performed.  7.  Thoroughly rinse your body with warm water from the neck down.  8.  DO NOT shower/wash with your normal soap after using and rinsing off the CHG Soap.                9.  Pat yourself dry with a clean towel.            10.  Wear clean pajamas.            11.  Place clean sheets on your bed the night of your first shower and do not  sleep with pets. Day of Surgery : Do not apply any lotions/deodorants the morning of surgery.  Please wear clean clothes to the hospital/surgery center.  FAILURE TO FOLLOW THESE INSTRUCTIONS MAY RESULT IN THE CANCELLATION OF YOUR SURGERY  PATIENT SIGNATURE_________________________________  NURSE SIGNATURE__________________________________  ________________________________________________________________________   Adam Phenix  An incentive spirometer is a tool that can help keep your lungs clear and active. This tool measures how well you are filling your lungs with each breath. Taking long deep breaths may help reverse or decrease the chance of  developing breathing (pulmonary) problems (especially infection) following:  A long period of time when you are unable to move or be active. BEFORE THE PROCEDURE   If the spirometer includes an indicator to show your best effort, your nurse or respiratory therapist will set it to a desired goal.  If possible, sit up straight or lean slightly forward. Try not to slouch.  Hold the incentive spirometer in an upright position. INSTRUCTIONS FOR USE  1. Sit on the edge of your bed if possible, or sit up as far as you can in bed or on a chair. 2. Hold the incentive spirometer in an upright position. 3. Breathe out normally. 4. Place the mouthpiece in your mouth and seal your lips tightly around it. 5. Breathe in slowly and as deeply as possible, raising the piston or the ball toward the top of the column. 6. Hold your breath for 3-5 seconds or for as long as possible. Allow the piston or ball to fall to the bottom of the column. 7. Remove the mouthpiece from your mouth and breathe out normally. 8. Rest for a few seconds and repeat Steps 1 through 7 at least 10 times every 1-2 hours when you are awake. Take your time and take a few normal breaths between deep breaths. 9. The spirometer may include an indicator to show your best effort. Use the indicator as a goal to work toward during each repetition. 10. After each set of 10 deep breaths, practice coughing to be sure your lungs are clear. If you have an incision (the cut made at the time of surgery), support your incision when coughing by placing a pillow or rolled up towels firmly against it. Once you are able to get out of bed, walk around indoors and cough well. You may stop using the incentive spirometer when instructed by your caregiver.  RISKS AND COMPLICATIONS  Take your time so you do not get  dizzy or light-headed.  If you are in pain, you may need to take or ask for pain medication before doing incentive spirometry. It is harder to take a  deep breath if you are having pain. AFTER USE  Rest and breathe slowly and easily.  It can be helpful to keep track of a log of your progress. Your caregiver can provide you with a simple table to help with this. If you are using the spirometer at home, follow these instructions: [place] IF:   You are having difficultly using the spirometer.  You have trouble using the spirometer as often as instructed.  Your pain medication is not giving enough relief while using the spirometer.  You develop fever of 100.5 F (38.1 C) or higher. SEEK IMMEDIATE MEDICAL CARE IF:   You cough up bloody sputum that had not been present before.  You develop fever of 102 F (38.9 C) or greater.  You develop worsening pain at or near the incision site. MAKE SURE YOU:   Understand these instructions.  Will watch your condition.  Will get help right away if you are not doing well or get worse. Document Released: 09/15/2006 Document Revised: 07/28/2011 Document Reviewed: 11/16/2006 Exit[employer] Patient Information 2014 Exit[employer], [place].   ________________________________________________________________________   WHAT IS A BLOOD TRANSFUSION? Blood Transfusion Information  A transfusion is the replacement of blood or some of its parts. Blood is made up of multiple cells which provide different functions.  Red blood cells carry oxygen and are used for blood loss replacement.  White blood cells fight against infection.  Platelets control bleeding.  Plasma helps clot blood.  Other blood products are available for specialized needs, such as hemophilia or other clotting disorders. BEFORE THE TRANSFUSION  Who gives blood for transfusions?   Healthy volunteers who are fully evaluated to make sure their blood is safe. This is blood bank blood. Transfusion therapy is the safest it has ever been in the practice of medicine. Before blood is taken from a donor, a complete history is taken to make  sure that person has no history of diseases nor engages in risky social behavior (examples are intravenous drug use or sexual activity with multiple partners). The donor's travel history is screened to minimize risk of transmitting infections, such as malaria. The donated blood is tested for signs of infectious diseases, such as HIV and hepatitis. The blood is then tested to be sure it is compatible with you in order to minimize the chance of a transfusion reaction. If you or a relative donates blood, this is often done in anticipation of surgery and is not appropriate for emergency situations. It takes many days to process the donated blood. RISKS AND COMPLICATIONS Although transfusion therapy is very safe and saves many lives, the main dangers of transfusion include:   Getting an infectious disease.  Developing a transfusion reaction. This is an allergic reaction to something in the blood you were given. Every precaution is taken to prevent this. The decision to have a blood transfusion has been considered carefully by your caregiver before blood is given. Blood is not given unless the benefits outweigh the risks. AFTER THE TRANSFUSION  Right after receiving a blood transfusion, you will usually feel much better and more energetic. This is especially true if your red blood cells have gotten low (anemic). The transfusion raises the level of the red blood cells which carry oxygen, and this usually causes an energy increase.  The nurse administering the transfusion  will monitor you carefully for complications. HOME CARE INSTRUCTIONS  No special instructions are needed after a transfusion. You may find your energy is better. Speak with your caregiver about any limitations on activity for underlying diseases you may have. SEEK MEDICAL CARE IF:   Your condition is not improving after your transfusion.  You develop redness or irritation at the intravenous (IV) site. SEEK IMMEDIATE MEDICAL CARE IF:   Any of the following symptoms occur over the next 12 hours:  Shaking chills.  You have a temperature by mouth above 102 F (38.9 C), not controlled by medicine.  Chest, back, or muscle pain.  People around you feel you are not acting correctly or are confused.  Shortness of breath or difficulty breathing.  Dizziness and fainting.  You get a rash or develop hives.  You have a decrease in urine output.  Your urine turns a dark color or changes to pink, red, or brown. Any of the following symptoms occur over the next 10 days:  You have a temperature by mouth above 102 F (38.9 C), not controlled by medicine.  Shortness of breath.  Weakness after normal activity.  The white part of the eye turns yellow (jaundice).  You have a decrease in the amount of urine or are urinating less often.  Your urine turns a dark color or changes to pink, red, or brown. Document Released: 05/02/2000 Document Revised: 07/28/2011 Document Reviewed: 12/20/2007 [facility] Patient Information 2014 [place], [place].  _______________________________________________________________________
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
## 2020-09-21 ENCOUNTER — Other Ambulatory Visit (HOSPITAL_COMMUNITY)
Admission: RE | Admit: 2020-09-21 | Discharge: 2020-09-21 | Disposition: A | Discharge status: Home / Self Care | Payer: Medicare Other | Source: Ambulatory Visit | Attending: Orthopedic Surgery | Admitting: Orthopedic Surgery

## 2020-09-21 ENCOUNTER — Other Ambulatory Visit: Payer: Self-pay

## 2020-09-21 ENCOUNTER — Encounter (HOSPITAL_COMMUNITY): Payer: Self-pay

## 2020-09-21 ENCOUNTER — Encounter (HOSPITAL_COMMUNITY)
Admission: RE | Admit: 2020-09-21 | Discharge: 2020-09-21 | Disposition: A | Discharge status: Home / Self Care | Payer: Medicare Other | Source: Ambulatory Visit | Attending: Orthopedic Surgery | Admitting: Orthopedic Surgery

## 2020-09-21 DIAGNOSIS — Z20822 Contact with and (suspected) exposure to covid-19: Secondary | ICD-10-CM | POA: Insufficient documentation

## 2020-09-21 DIAGNOSIS — Z01812 Encounter for preprocedural laboratory examination: Secondary | ICD-10-CM | POA: Insufficient documentation

## 2020-09-21 HISTORY — DX: Anxiety disorder, unspecified: F41.9

## 2020-09-21 HISTORY — DX: Unspecified atrial fibrillation: I48.91

## 2020-09-21 HISTORY — DX: Essential (primary) hypertension: I10

## 2020-09-21 HISTORY — DX: Unspecified osteoarthritis, unspecified site: M19.90

## 2020-09-21 LAB — CBC
HCT: 47.3 % (ref 39.0–52.0)
Hemoglobin: 15.6 g/dL (ref 13.0–17.0)
MCH: 30.1 pg (ref 26.0–34.0)
MCHC: 33 g/dL (ref 30.0–36.0)
MCV: 91.3 fL (ref 80.0–100.0)
Platelets: 281 10*3/uL (ref 150–400)
RBC: 5.18 MIL/uL (ref 4.22–5.81)
RDW: 13.6 % (ref 11.5–15.5)
WBC: 4.8 10*3/uL (ref 4.0–10.5)
nRBC: 0 % (ref 0.0–0.2)

## 2020-09-21 LAB — COMPREHENSIVE METABOLIC PANEL
ALT: 16 U/L (ref 0–44)
AST: 17 U/L (ref 15–41)
Albumin: 4.3 g/dL (ref 3.5–5.0)
Alkaline Phosphatase: 74 U/L (ref 38–126)
Anion gap: 11 (ref 5–15)
BUN: 7 mg/dL — ABNORMAL LOW (ref 8–23)
CO2: 22 mmol/L (ref 22–32)
Calcium: 9.6 mg/dL (ref 8.9–10.3)
Chloride: 104 mmol/L (ref 98–111)
Creatinine, Ser: 1 mg/dL (ref 0.61–1.24)
GFR, Estimated: >60 mL/min (ref >60)
Glucose, Bld: 93 mg/dL (ref 70–99)
Potassium: 3.8 mmol/L (ref 3.5–5.1)
Sodium: 137 mmol/L (ref 135–145)
Total Bilirubin: 0.8 mg/dL (ref 0.3–1.2)
Total Protein: 7.5 g/dL (ref 6.5–8.1)

## 2020-09-21 LAB — APTT: aPTT: 30 seconds (ref 24–36)

## 2020-09-21 LAB — SURGICAL PCR SCREEN
MRSA, PCR: NEGATIVE
Staphylococcus aureus: NEGATIVE

## 2020-09-21 LAB — PROTIME-INR
INR: 1 (ref 0.8–1.2)
Prothrombin Time: 13.1 seconds (ref 11.4–15.2)

## 2020-09-22 LAB — SARS CORONAVIRUS 2 (TAT 6-24 HRS): SARS Coronavirus 2: NEGATIVE

## 2020-09-24 NOTE — H&P (Signed)
TOTAL KNEE ADMISSION H&P  Patient is being admitted for left total knee arthroplasty.  Subjective:  Chief Complaint:left knee pain.  HPI: Philip Mcneil, 70 y.o. male, has a history of pain and functional disability in the left knee due to arthritis and has failed non-surgical conservative treatments for greater than 12 weeks to includeNSAID's and/or analgesics and activity modification.  Onset of symptoms was gradual, starting 2 years ago with gradually worsening course since that time. The patient noted prior procedures on the knee to include  arthroscopy and menisectomy on the left knee(s).  Patient currently rates pain in the left knee(s) at 7 out of 10 with activity. Patient has worsening of pain with activity and weight bearing and pain that interferes with activities of daily living.  Patient has evidence of joint space narrowing by imaging studies. There is no active infection.  There are no problems to display for this patient.  Past Medical History:  Diagnosis Date  . A-fib ([place])   . Anxiety   . Arthritis   . Cancer ([place])   . GERD (gastroesophageal reflux disease)   . Hypertension     Past Surgical History:  Procedure Laterality Date  . BACK SURGERY     x2  . CATARACT EXTRACTION Bilateral   . CERVICAL LAMINECTOMY    . INSERTION OF MESH N/A 11/23/2013   Procedure: INSERTION OF MESH;  Surgeon: Jamesetta So, MD;  Location: [facility];  Service: General;  Laterality: N/A;  . KNEE ARTHROSCOPY Left   . ORIF FOOT FRACTURE Right   . PROSTATECTOMY     [place]  . ROTATOR CUFF REPAIR Right   . THUMB AMPUTATION Left   . tumor retina    . TYMPANOPLASTY Left 02/22/2018   Procedure: TYMPANOPLASTY;  Surgeon: [name], MD;  Location: [facility];  Service: ENT;  Laterality: Left;  . UMBILICAL HERNIA REPAIR N/A 11/23/2013   Procedure: UMBILICAL HERNIORRHAPHY;  Surgeon: Jamesetta So, MD;  Location: [facility];  Service: General;  Laterality: N/A;    No current  facility-administered medications for this encounter.   Current Outpatient Medications  Medication Sig Dispense Refill Last Dose  . ALPRAZolam (XANAX) 0.5 MG tablet Take 0.5 mg by mouth 3 (three) times daily as needed for anxiety.  0   . ibuprofen (ADVIL) 200 MG tablet Take 400 mg by mouth every 8 (eight) hours as needed for moderate pain.     Marland Kitchen losartan (COZAAR) 25 MG tablet Take 25 mg by mouth daily.     . Loteprednol Etabonate 0.5 % GEL Place 1 drop into the right eye daily.     Marland Kitchen OVER THE COUNTER MEDICATION Apply 1 application topically daily as needed (pain). Pain Relief cream     . pantoprazole (PROTONIX) 40 MG tablet Take 40 mg by mouth daily.     Marland Kitchen PROLENSA 0.07 % SOLN Place 1 drop into the right eye at bedtime.      Allergies  Allergen Reactions  . Morphine And Related Anaphylaxis  . Percocet [Oxycodone-Acetaminophen]     Itchy/jumpy     Social History   Tobacco Use  . Smoking status: Former Smoker    Packs/day: 0.25    Years: 20.00    Pack years: 5.00    Types: Cigarettes    Quit date: 11/18/2003    Years since quitting: 16.8  . Smokeless tobacco: Never Used  Substance Use Topics  . Alcohol use: No    Alcohol/week: 0.0 standard drinks  Family History  Problem Relation Age of Onset  . Alzheimer's disease Mother   . Alzheimer's disease Father   . Prostate cancer Father   . Alzheimer's disease Paternal Grandfather   . Allergic rhinitis Neg Hx   . Asthma Neg Hx   . Eczema Neg Hx   . Immunodeficiency Neg Hx   . Urticaria Neg Hx      Review of Systems  Constitutional: Negative for chills and fever.  Respiratory: Negative for cough and shortness of breath.   Cardiovascular: Negative for chest pain.  Gastrointestinal: Negative for nausea and vomiting.  Musculoskeletal: Positive for arthralgias.    Objective:  Physical Exam Well nourished and well developed. General: Alert and oriented x3, cooperative and pleasant, no acute distress. Head: normocephalic,  atraumatic, neck supple. Eyes: EOMI.  Musculoskeletal: Left Knee: Mild tenderness to palpation about the medial and lateral joint line of the left knee. AROM 3-120 degrees. No effusion noted. No instability. Mild patellofemoral crepitus.  Calves soft and nontender. Motor function intact in LE. Strength 5/5 LE bilaterally. Neuro: Distal pulses 2+. Sensation to light touch intact in LE.  Vital signs in last 24 hours:    Labs:   Estimated body mass index is 32.48 kg/m as calculated from the following:   Height as of 09/21/20: 5\' 8"  (1.727 m).   Weight as of 09/21/20: 96.9 kg.   Imaging Review Plain radiographs demonstrate severe degenerative joint disease of the left knee(s). The overall alignment isneutral. The bone quality appears to be adequate for age and reported activity level.   Assessment/Plan:  End stage arthritis, left knee   The patient history, physical examination, clinical judgment of the provider and imaging studies are consistent with end stage degenerative joint disease of the left knee(s) and total knee arthroplasty is deemed medically necessary. The treatment options including medical management, injection therapy arthroscopy and arthroplasty were discussed at length. The risks and benefits of total knee arthroplasty were presented and reviewed. The risks due to aseptic loosening, infection, stiffness, patella tracking problems, thromboembolic complications and other imponderables were discussed. The patient acknowledged the explanation, agreed to proceed with the plan and consent was signed. Patient is being admitted for inpatient treatment for surgery, pain control, PT, OT, prophylactic antibiotics, VTE prophylaxis, progressive ambulation and ADL's and discharge planning. The patient is planning to be discharged home.  Therapy Plans: [facility] at [facility] Disposition: Home with neighbor Planned DVT Prophylaxis: aspirin 81mg  BID DME needed:  3-n-1 PCP: Dr. Quintin Alto, clearance received Cardiologist: Dr. Bronson Ing TXA: IV Allergies: Percocet - itching Anesthesia Concerns: none BMI: 32.7 Not diabetic.  Other: - Hydrocodone - Hx of prostate CA, s/p prostatectomy and radiation > recently had hematuria with negative cystoscopy and CT - foley okay - nicotine patch   Patient's anticipated LOS is less than 2 midnights, meeting these requirements: - Lives within 1 hour of care - Has a competent adult at home to recover with post-op recover - NO history of  - Chronic pain requiring opiods  - Diabetes  - Coronary Artery Disease  - Heart failure  - Heart attack  - Stroke  - DVT/VTE  - Cardiac arrhythmia  - Respiratory Failure/COPD  - Renal failure  - Anemia  - Advanced Liver disease   Griffith Citron, PA-C Orthopedic Surgery [facility] [place] [phone]

## 2020-09-25 ENCOUNTER — Ambulatory Visit (HOSPITAL_COMMUNITY): Payer: Medicare Other | Admitting: Physician Assistant

## 2020-09-25 ENCOUNTER — Encounter (HOSPITAL_COMMUNITY)
Admission: AD | Disposition: A | Discharge status: Home / Self Care | Payer: Self-pay | Source: Home / Self Care | Attending: Orthopedic Surgery

## 2020-09-25 ENCOUNTER — Encounter (HOSPITAL_COMMUNITY): Payer: Self-pay | Admitting: Orthopedic Surgery

## 2020-09-25 ENCOUNTER — Ambulatory Visit (HOSPITAL_COMMUNITY): Payer: Medicare Other | Admitting: Certified Registered Nurse Anesthetist

## 2020-09-25 ENCOUNTER — Inpatient Hospital Stay (HOSPITAL_COMMUNITY)
Admission: AD | Admit: 2020-09-25 | Discharge: 2020-09-29 | DRG: 470 | Disposition: A | Discharge status: Home / Self Care | Payer: Medicare Other | Attending: Orthopedic Surgery | Admitting: Orthopedic Surgery

## 2020-09-25 ENCOUNTER — Other Ambulatory Visit: Payer: Self-pay

## 2020-09-25 DIAGNOSIS — K219 Gastro-esophageal reflux disease without esophagitis: Secondary | ICD-10-CM | POA: Diagnosis present

## 2020-09-25 DIAGNOSIS — M659 Synovitis and tenosynovitis, unspecified: Secondary | ICD-10-CM | POA: Diagnosis not present

## 2020-09-25 DIAGNOSIS — I10 Essential (primary) hypertension: Secondary | ICD-10-CM | POA: Diagnosis not present

## 2020-09-25 DIAGNOSIS — Z79899 Other long term (current) drug therapy: Secondary | ICD-10-CM | POA: Diagnosis not present

## 2020-09-25 DIAGNOSIS — R339 Retention of urine, unspecified: Secondary | ICD-10-CM | POA: Diagnosis not present

## 2020-09-25 DIAGNOSIS — E669 Obesity, unspecified: Secondary | ICD-10-CM | POA: Diagnosis present

## 2020-09-25 DIAGNOSIS — Z20822 Contact with and (suspected) exposure to covid-19: Secondary | ICD-10-CM | POA: Diagnosis not present

## 2020-09-25 DIAGNOSIS — M1712 Unilateral primary osteoarthritis, left knee: Secondary | ICD-10-CM | POA: Diagnosis not present

## 2020-09-25 DIAGNOSIS — Z6832 Body mass index (BMI) 32.0-32.9, adult: Secondary | ICD-10-CM

## 2020-09-25 DIAGNOSIS — Z87891 Personal history of nicotine dependence: Secondary | ICD-10-CM

## 2020-09-25 DIAGNOSIS — Z96652 Presence of left artificial knee joint: Secondary | ICD-10-CM

## 2020-09-25 DIAGNOSIS — G8918 Other acute postprocedural pain: Secondary | ICD-10-CM | POA: Diagnosis not present

## 2020-09-25 HISTORY — PX: TOTAL KNEE ARTHROPLASTY: SHX125

## 2020-09-25 LAB — ABO/RH: ABO/RH(D): A POS

## 2020-09-25 LAB — TYPE AND SCREEN
ABO/RH(D): A POS
Antibody Screen: NEGATIVE

## 2020-09-25 SURGERY — ARTHROPLASTY, KNEE, TOTAL
Anesthesia: Spinal | Site: Knee | Laterality: Left

## 2020-09-25 MED ORDER — CEFAZOLIN SODIUM-DEXTROSE 2-4 GM/100ML-% IV SOLN
2.0000 g | Freq: Four times a day (QID) | INTRAVENOUS | Status: AC
  Administered 2020-09-25 (×2): 2 g via INTRAVENOUS
  Filled 2020-09-25 (×2): qty 100

## 2020-09-25 MED ORDER — SODIUM CHLORIDE 0.9 % IR SOLN
Status: DC | PRN
  Administered 2020-09-25: 1000 mL

## 2020-09-25 MED ORDER — CHLORHEXIDINE GLUCONATE 0.12 % MT SOLN
15.0000 mL | Freq: Once | OROMUCOSAL | Status: AC
  Administered 2020-09-25: 15 mL via OROMUCOSAL

## 2020-09-25 MED ORDER — NICOTINE 7 MG/24HR TD PT24
7.0000 mg | MEDICATED_PATCH | Freq: Every day | TRANSDERMAL | Status: DC
  Administered 2020-09-25 – 2020-09-29 (×5): 7 mg via TRANSDERMAL
  Filled 2020-09-25 (×5): qty 1

## 2020-09-25 MED ORDER — ROPIVACAINE HCL 5 MG/ML IJ SOLN
INTRAMUSCULAR | Status: DC | PRN
  Administered 2020-09-25 (×2): 5 mL via PERINEURAL

## 2020-09-25 MED ORDER — KETOROLAC TROMETHAMINE 15 MG/ML IJ SOLN: 15.0000 mg | Freq: Once | INTRAMUSCULAR | Status: DC

## 2020-09-25 MED ORDER — TRANEXAMIC ACID-NACL 1000-0.7 MG/100ML-% IV SOLN
1000.0000 mg | Freq: Once | INTRAVENOUS | Status: AC
  Administered 2020-09-25: 1000 mg via INTRAVENOUS
  Filled 2020-09-25: qty 100

## 2020-09-25 MED ORDER — POVIDONE-IODINE 10 % EX SWAB
2.0000 "application " | Freq: Once | CUTANEOUS | Status: AC
  Administered 2020-09-25: 2 via TOPICAL

## 2020-09-25 MED ORDER — PROPOFOL 1000 MG/100ML IV EMUL
INTRAVENOUS | Status: AC
  Filled 2020-09-25: qty 100

## 2020-09-25 MED ORDER — PHENYLEPHRINE 40 MCG/ML (10ML) SYRINGE FOR IV PUSH (FOR BLOOD PRESSURE SUPPORT)
PREFILLED_SYRINGE | INTRAVENOUS | Status: AC
  Filled 2020-09-25: qty 10

## 2020-09-25 MED ORDER — ONDANSETRON HCL 4 MG/2ML IJ SOLN: 4.0000 mg | Freq: Once | INTRAMUSCULAR | Status: DC | PRN

## 2020-09-25 MED ORDER — LOSARTAN POTASSIUM 25 MG PO TABS
25.0000 mg | ORAL_TABLET | Freq: Every day | ORAL | Status: DC
  Administered 2020-09-26 – 2020-09-29 (×3): 25 mg via ORAL
  Filled 2020-09-25 (×3): qty 1

## 2020-09-25 MED ORDER — BISACODYL 10 MG RE SUPP: 10.0000 mg | Freq: Every day | RECTAL | Status: DC | PRN

## 2020-09-25 MED ORDER — EPHEDRINE SULFATE-NACL 50-0.9 MG/10ML-% IV SOSY
PREFILLED_SYRINGE | INTRAVENOUS | Status: DC | PRN
  Administered 2020-09-25: 10 mg via INTRAVENOUS

## 2020-09-25 MED ORDER — DEXAMETHASONE SODIUM PHOSPHATE 10 MG/ML IJ SOLN
8.0000 mg | Freq: Once | INTRAMUSCULAR | Status: AC
  Administered 2020-09-25: 4 mg via INTRAVENOUS

## 2020-09-25 MED ORDER — PROPOFOL 500 MG/50ML IV EMUL
INTRAVENOUS | Status: DC | PRN
  Administered 2020-09-25: 100 ug/kg/min via INTRAVENOUS

## 2020-09-25 MED ORDER — CEFAZOLIN SODIUM-DEXTROSE 2-4 GM/100ML-% IV SOLN
2.0000 g | INTRAVENOUS | Status: AC
Start: 2020-09-25 — End: 2020-09-25
  Administered 2020-09-25: 2 g via INTRAVENOUS
  Filled 2020-09-25: qty 100

## 2020-09-25 MED ORDER — TRANEXAMIC ACID-NACL 1000-0.7 MG/100ML-% IV SOLN
1000.0000 mg | INTRAVENOUS | Status: AC
  Administered 2020-09-25: 1000 mg via INTRAVENOUS
  Filled 2020-09-25: qty 100

## 2020-09-25 MED ORDER — POLYETHYLENE GLYCOL 3350 17 G PO PACK: 17.0000 g | PACK | Freq: Every day | ORAL | Status: DC | PRN

## 2020-09-25 MED ORDER — ALPRAZOLAM 0.5 MG PO TABS: 0.5000 mg | ORAL_TABLET | Freq: Three times a day (TID) | ORAL | Status: DC | PRN

## 2020-09-25 MED ORDER — KETOROLAC TROMETHAMINE 0.5 % OP SOLN
1.0000 [drp] | Freq: Every day | OPHTHALMIC | Status: DC
  Administered 2020-09-25 – 2020-09-28 (×4): 1 [drp] via OPHTHALMIC
  Filled 2020-09-25: qty 5

## 2020-09-25 MED ORDER — 0.9 % SODIUM CHLORIDE (POUR BTL) OPTIME
TOPICAL | Status: DC | PRN
  Administered 2020-09-25: 1000 mL

## 2020-09-25 MED ORDER — MIDAZOLAM HCL 2 MG/2ML IJ SOLN
1.0000 mg | INTRAMUSCULAR | Status: AC
  Administered 2020-09-25 (×2): 1 mg via INTRAVENOUS
  Filled 2020-09-25: qty 2

## 2020-09-25 MED ORDER — FERROUS SULFATE 325 (65 FE) MG PO TABS
325.0000 mg | ORAL_TABLET | Freq: Three times a day (TID) | ORAL | Status: DC
  Administered 2020-09-25 – 2020-09-29 (×11): 325 mg via ORAL
  Filled 2020-09-25 (×11): qty 1

## 2020-09-25 MED ORDER — ONDANSETRON HCL 4 MG/2ML IJ SOLN: 4.0000 mg | Freq: Four times a day (QID) | INTRAMUSCULAR | Status: DC | PRN

## 2020-09-25 MED ORDER — FENTANYL CITRATE (PF) 100 MCG/2ML IJ SOLN: 25.0000 ug | INTRAMUSCULAR | Status: DC | PRN

## 2020-09-25 MED ORDER — PROPOFOL 10 MG/ML IV BOLUS
INTRAVENOUS | Status: DC | PRN
  Administered 2020-09-25 (×4): 20 mg via INTRAVENOUS

## 2020-09-25 MED ORDER — EPHEDRINE 5 MG/ML INJ
INTRAVENOUS | Status: AC
  Filled 2020-09-25: qty 10

## 2020-09-25 MED ORDER — METHOCARBAMOL 500 MG PO TABS
500.0000 mg | ORAL_TABLET | Freq: Four times a day (QID) | ORAL | Status: DC | PRN
  Administered 2020-09-25 – 2020-09-27 (×3): 500 mg via ORAL
  Filled 2020-09-25 (×3): qty 1

## 2020-09-25 MED ORDER — METOCLOPRAMIDE HCL 5 MG/ML IJ SOLN: 5.0000 mg | Freq: Three times a day (TID) | INTRAMUSCULAR | Status: DC | PRN

## 2020-09-25 MED ORDER — BUPIVACAINE-EPINEPHRINE (PF) 0.25% -1:200000 IJ SOLN
INTRAMUSCULAR | Status: AC
  Filled 2020-09-25: qty 30

## 2020-09-25 MED ORDER — SODIUM CHLORIDE 0.9 % IV SOLN: INTRAVENOUS | Status: DC

## 2020-09-25 MED ORDER — DOCUSATE SODIUM 100 MG PO CAPS
100.0000 mg | ORAL_CAPSULE | Freq: Two times a day (BID) | ORAL | Status: DC
  Administered 2020-09-25 – 2020-09-29 (×8): 100 mg via ORAL
  Filled 2020-09-25 (×8): qty 1

## 2020-09-25 MED ORDER — HYDROCODONE-ACETAMINOPHEN 7.5-325 MG PO TABS
1.0000 | ORAL_TABLET | ORAL | Status: DC | PRN
  Administered 2020-09-26 – 2020-09-27 (×2): 2 via ORAL
  Filled 2020-09-25 (×3): qty 2

## 2020-09-25 MED ORDER — PHENOL 1.4 % MT LIQD
1.0000 | OROMUCOSAL | Status: DC | PRN
Start: 2020-09-25 — End: 2020-09-29

## 2020-09-25 MED ORDER — PHENYLEPHRINE HCL-NACL 10-0.9 MG/250ML-% IV SOLN
INTRAVENOUS | Status: DC | PRN
  Administered 2020-09-25: 50 ug/min via INTRAVENOUS

## 2020-09-25 MED ORDER — STERILE WATER FOR IRRIGATION IR SOLN
Status: DC | PRN
  Administered 2020-09-25: 2000 mL

## 2020-09-25 MED ORDER — ONDANSETRON HCL 4 MG PO TABS: 4.0000 mg | ORAL_TABLET | Freq: Four times a day (QID) | ORAL | Status: DC | PRN

## 2020-09-25 MED ORDER — BUPIVACAINE-EPINEPHRINE (PF) 0.25% -1:200000 IJ SOLN
INTRAMUSCULAR | Status: DC | PRN
  Administered 2020-09-25: 30 mL

## 2020-09-25 MED ORDER — HYDROMORPHONE HCL 1 MG/ML IJ SOLN: 0.5000 mg | INTRAMUSCULAR | Status: DC | PRN

## 2020-09-25 MED ORDER — ORAL CARE MOUTH RINSE: 15.0000 mL | Freq: Once | OROMUCOSAL | Status: AC

## 2020-09-25 MED ORDER — MEPERIDINE HCL 50 MG/ML IJ SOLN: 6.2500 mg | INTRAMUSCULAR | Status: DC | PRN

## 2020-09-25 MED ORDER — KETOROLAC TROMETHAMINE 30 MG/ML IJ SOLN
INTRAMUSCULAR | Status: AC
  Filled 2020-09-25: qty 1

## 2020-09-25 MED ORDER — LOTEPREDNOL ETABONATE 0.5 % OP GEL: 1.0000 [drp] | Freq: Every day | OPHTHALMIC | Status: DC

## 2020-09-25 MED ORDER — BUPIVACAINE IN DEXTROSE 0.75-8.25 % IT SOLN
INTRATHECAL | Status: DC | PRN
  Administered 2020-09-25: 1.6 mL via INTRATHECAL

## 2020-09-25 MED ORDER — DEXTROSE 5 % IV SOLN
500.0000 mg | Freq: Four times a day (QID) | INTRAVENOUS | Status: DC | PRN
  Filled 2020-09-25: qty 5

## 2020-09-25 MED ORDER — HYDROCODONE-ACETAMINOPHEN 5-325 MG PO TABS
1.0000 | ORAL_TABLET | ORAL | Status: DC | PRN
Start: 2020-09-25 — End: 2020-09-29
  Administered 2020-09-25: 1 via ORAL
  Administered 2020-09-26 – 2020-09-28 (×7): 2 via ORAL
  Administered 2020-09-29: 1 via ORAL
  Administered 2020-09-29 (×2): 2 via ORAL
  Filled 2020-09-25: qty 1
  Filled 2020-09-25 (×4): qty 2
  Filled 2020-09-25: qty 1
  Filled 2020-09-25 (×4): qty 2
  Filled 2020-09-25: qty 1
  Filled 2020-09-25: qty 2

## 2020-09-25 MED ORDER — SODIUM CHLORIDE (PF) 0.9 % IJ SOLN
INTRAMUSCULAR | Status: DC | PRN
  Administered 2020-09-25: 30 mL

## 2020-09-25 MED ORDER — CLONIDINE HCL (ANALGESIA) 100 MCG/ML EP SOLN
EPIDURAL | Status: DC | PRN
  Administered 2020-09-25: 100 ug

## 2020-09-25 MED ORDER — FENTANYL CITRATE (PF) 100 MCG/2ML IJ SOLN
50.0000 ug | INTRAMUSCULAR | Status: AC
Start: 2020-09-25 — End: 2020-09-25
  Administered 2020-09-25 (×2): 50 ug via INTRAVENOUS
  Filled 2020-09-25: qty 2

## 2020-09-25 MED ORDER — KETOROLAC TROMETHAMINE 30 MG/ML IJ SOLN
INTRAMUSCULAR | Status: DC | PRN
  Administered 2020-09-25: 30 mg

## 2020-09-25 MED ORDER — METOCLOPRAMIDE HCL 5 MG PO TABS: 5.0000 mg | ORAL_TABLET | Freq: Three times a day (TID) | ORAL | Status: DC | PRN

## 2020-09-25 MED ORDER — PHENYLEPHRINE 40 MCG/ML (10ML) SYRINGE FOR IV PUSH (FOR BLOOD PRESSURE SUPPORT)
PREFILLED_SYRINGE | INTRAVENOUS | Status: DC | PRN
  Administered 2020-09-25 (×2): 80 ug via INTRAVENOUS

## 2020-09-25 MED ORDER — DEXAMETHASONE SODIUM PHOSPHATE 10 MG/ML IJ SOLN
INTRAMUSCULAR | Status: DC | PRN
  Administered 2020-09-25: 10 mg

## 2020-09-25 MED ORDER — MENTHOL 3 MG MT LOZG: 1.0000 | LOZENGE | OROMUCOSAL | Status: DC | PRN

## 2020-09-25 MED ORDER — DIPHENHYDRAMINE HCL 12.5 MG/5ML PO ELIX: 12.5000 mg | ORAL_SOLUTION | ORAL | Status: DC | PRN

## 2020-09-25 MED ORDER — LACTATED RINGERS IV SOLN: INTRAVENOUS | Status: DC

## 2020-09-25 MED ORDER — ROPIVACAINE HCL 7.5 MG/ML IJ SOLN
INTRAMUSCULAR | Status: DC | PRN
  Administered 2020-09-25 (×4): 5 mL via PERINEURAL

## 2020-09-25 MED ORDER — DEXAMETHASONE SODIUM PHOSPHATE 10 MG/ML IJ SOLN
10.0000 mg | Freq: Once | INTRAMUSCULAR | Status: AC
  Administered 2020-09-26: 10 mg via INTRAVENOUS
  Filled 2020-09-25: qty 1

## 2020-09-25 MED ORDER — ASPIRIN 81 MG PO CHEW
81.0000 mg | CHEWABLE_TABLET | Freq: Two times a day (BID) | ORAL | Status: DC
  Administered 2020-09-25 – 2020-09-29 (×8): 81 mg via ORAL
  Filled 2020-09-25 (×8): qty 1

## 2020-09-25 MED ORDER — ONDANSETRON HCL 4 MG/2ML IJ SOLN
INTRAMUSCULAR | Status: DC | PRN
  Administered 2020-09-25: 4 mg via INTRAVENOUS

## 2020-09-25 MED ORDER — PANTOPRAZOLE SODIUM 40 MG PO TBEC
40.0000 mg | DELAYED_RELEASE_TABLET | Freq: Every day | ORAL | Status: DC
  Administered 2020-09-25 – 2020-09-29 (×5): 40 mg via ORAL
  Filled 2020-09-25 (×5): qty 1

## 2020-09-25 MED ORDER — ACETAMINOPHEN 325 MG PO TABS: 325.0000 mg | ORAL_TABLET | Freq: Four times a day (QID) | ORAL | Status: DC | PRN

## 2020-09-25 MED ORDER — SODIUM CHLORIDE (PF) 0.9 % IJ SOLN
INTRAMUSCULAR | Status: AC
  Filled 2020-09-25: qty 30

## 2020-09-25 SURGICAL SUPPLY — 52 items
ATTUNE MED ANAT PAT 38 KNEE (Knees) ×2 IMPLANT
ATTUNE PS FEM LT SZ 5 CEM KNEE (Femur) ×2 IMPLANT
ATTUNE PSRP INSE SZ5 7 KNEE (Insert) ×2 IMPLANT
BAG ZIPLOCK 12X15 (MISCELLANEOUS) IMPLANT
BASE TIBIAL ROT PLAT SZ 5 KNEE (Knees) ×1 IMPLANT
BLADE SAW SGTL 11.0X1.19X90.0M (BLADE) IMPLANT
BLADE SAW SGTL 13.0X1.19X90.0M (BLADE) ×2 IMPLANT
BLADE SURG SZ10 CARB STEEL (BLADE) ×4 IMPLANT
BNDG ELASTIC 6X5.8 VLCR STR LF (GAUZE/BANDAGES/DRESSINGS) ×2 IMPLANT
BOWL SMART MIX CTS (DISPOSABLE) ×2 IMPLANT
CEMENT HV SMART SET (Cement) ×4 IMPLANT
COVER WAND RF STERILE (DRAPES) IMPLANT
CUFF TOURN SGL QUICK 34 (TOURNIQUET CUFF) ×2
CUFF TRNQT CYL 34X4.125X (TOURNIQUET CUFF) ×1 IMPLANT
DECANTER SPIKE VIAL GLASS SM (MISCELLANEOUS) ×4 IMPLANT
DERMABOND ADVANCED (GAUZE/BANDAGES/DRESSINGS) ×1
DERMABOND ADVANCED .7 DNX12 (GAUZE/BANDAGES/DRESSINGS) ×1 IMPLANT
DRAPE U-SHAPE 47X51 STRL (DRAPES) ×2 IMPLANT
DRESSING AQUACEL AG SP 3.5X10 (GAUZE/BANDAGES/DRESSINGS) ×1 IMPLANT
DRSG AQUACEL AG SP 3.5X10 (GAUZE/BANDAGES/DRESSINGS) ×2
DURAPREP 26ML APPLICATOR (WOUND CARE) ×4 IMPLANT
ELECT REM PT RETURN 15FT ADLT (MISCELLANEOUS) ×2 IMPLANT
GLOVE SURG ENC MOIS LTX SZ6 (GLOVE) IMPLANT
GLOVE SURG ORTHO LTX SZ7.5 (GLOVE) ×2 IMPLANT
GLOVE SURG UNDER LTX SZ7.5 (GLOVE) ×2 IMPLANT
GLOVE SURG UNDER POLY LF SZ6.5 (GLOVE) IMPLANT
GOWN STRL REUS W/TWL LRG LVL3 (GOWN DISPOSABLE) ×2 IMPLANT
HANDPIECE INTERPULSE COAX TIP (DISPOSABLE) ×2
HOLDER FOLEY CATH W/STRAP (MISCELLANEOUS) IMPLANT
KIT TURNOVER KIT A (KITS) ×2 IMPLANT
MANIFOLD NEPTUNE II (INSTRUMENTS) ×2 IMPLANT
NDL SAFETY ECLIPSE 18X1.5 (NEEDLE) IMPLANT
NEEDLE HYPO 18GX1.5 SHARP (NEEDLE)
NS IRRIG 1000ML POUR BTL (IV SOLUTION) ×2 IMPLANT
PACK TOTAL KNEE CUSTOM (KITS) ×2 IMPLANT
PENCIL SMOKE EVACUATOR (MISCELLANEOUS) IMPLANT
PIN DRILL FIX HALF THREAD (BIT) ×2 IMPLANT
PIN FIX SIGMA LCS THRD HI (PIN) ×2 IMPLANT
PROTECTOR NERVE ULNAR (MISCELLANEOUS) ×2 IMPLANT
SET HNDPC FAN SPRY TIP SCT (DISPOSABLE) ×1 IMPLANT
SET PAD KNEE POSITIONER (MISCELLANEOUS) ×2 IMPLANT
SUT MNCRL AB 4-0 PS2 18 (SUTURE) ×2 IMPLANT
SUT STRATAFIX PDS+ 0 24IN (SUTURE) ×2 IMPLANT
SUT VIC AB 1 CT1 36 (SUTURE) ×2 IMPLANT
SUT VIC AB 2-0 CT1 27 (SUTURE) ×6
SUT VIC AB 2-0 CT1 TAPERPNT 27 (SUTURE) ×3 IMPLANT
SYR 3ML LL SCALE MARK (SYRINGE) ×2 IMPLANT
TIBIAL BASE ROT PLAT SZ 5 KNEE (Knees) ×2 IMPLANT
TRAY FOLEY MTR SLVR 16FR STAT (SET/KITS/TRAYS/PACK) ×2 IMPLANT
TUBE SUCTION HIGH CAP CLEAR NV (SUCTIONS) ×2 IMPLANT
WATER STERILE IRR 1000ML POUR (IV SOLUTION) ×4 IMPLANT
WRAP KNEE MAXI GEL POST OP (GAUZE/BANDAGES/DRESSINGS) ×2 IMPLANT

## 2020-09-25 NOTE — Interval H&P Note (Signed)
History and Physical Interval Note:  09/25/2020 8:35 AM  Philip Mcneil  has presented today for surgery, with the diagnosis of Left knee osteoarthritis.  The various methods of treatment have been discussed with the patient and family. After consideration of risks, benefits and other options for treatment, the patient has consented to  Procedure(s) with comments: TOTAL KNEE ARTHROPLASTY (Left) - 70 mins as a surgical intervention.  The patient's history has been reviewed, patient examined, no change in status, stable for surgery.  I have reviewed the patient's chart and labs.  Questions were answered to the patient's satisfaction.     Mauri Pole

## 2020-09-25 NOTE — Care Plan (Signed)
Ortho Bundle Case Management Note  Patient Details  Name: Philip Mcneil MRN: [id number] Date of Birth: [birth year]                  L TKA on 09-25-20 DCP: Home with assist from neighbor. Lives in a 1 story townhome with 1 ste. DME: Has a RW. 3-in-1 ordered through [facility]. PT: [facility]. PT eval to be scheduled on 09-28-20.   DME Arranged:  3-N-1 DME Agency:  [facility]  HH Arranged:    [place] Agency:     Additional Comments: Please contact me with any questions of if this plan should need to change.  Marianne Sofia, RN,CCM [facility]  [phone] 09/25/2020, 1:19 PM

## 2020-09-25 NOTE — Anesthesia Procedure Notes (Addendum)
Procedure Name: MAC Date/Time: 09/25/2020 9:52 AM Performed by: West Pugh, CRNA Pre-anesthesia Checklist: Patient identified, Emergency Drugs available, Suction available, Patient being monitored and Timeout performed Patient Re-evaluated:Patient Re-evaluated prior to induction Oxygen Delivery Method: Simple face mask Preoxygenation: Pre-oxygenation with 100% oxygen Placement Confirmation: positive ETCO2 Dental Injury: Teeth and Oropharynx as per pre-operative assessment

## 2020-09-25 NOTE — Anesthesia Procedure Notes (Signed)
Spinal  Patient location during procedure: OR Start time: 09/25/2020 10:01 AM End time: 09/25/2020 10:10 AM Reason for block: surgical anesthesia Staffing Performed: anesthesiologist  Anesthesiologist: Lyn Hollingshead, MD Preanesthetic Checklist Completed: patient identified, IV checked, site marked, risks and benefits discussed, surgical consent, monitors and equipment checked, pre-op evaluation and timeout performed Spinal Block Patient position: sitting Prep: DuraPrep and site prepped and draped Patient monitoring: continuous pulse ox and blood pressure Approach: midline Location: L3-4 Injection technique: single-shot Needle Needle type: Pencan  Needle gauge: 22 G Needle length: 9 cm Needle insertion depth: 6 cm Assessment Sensory level: T8 Events: CSF return and second provider

## 2020-09-25 NOTE — Op Note (Signed)
NAME:  Philip Mcneil RECORD NO.:  [id number]                             FACILITY:  [facility]      PHYSICIAN:  Pietro Cassis. Alvan Dame, M.D.  DATE OF BIRTH:  27-Mar-1951      DATE OF PROCEDURE:  09/25/2020                                     OPERATIVE REPORT         PREOPERATIVE DIAGNOSIS:  Left knee osteoarthritis.      POSTOPERATIVE DIAGNOSIS:  Left knee osteoarthritis.      FINDINGS:  The patient was noted to have complete loss of cartilage and   bone-on-bone arthritis with associated osteophytes in the medial and patellofemoral compartments of   the knee with a significant synovitis and associated effusion.  The patient had failed months of conservative treatment including medications, injection therapy, activity modification.     PROCEDURE:  Left total knee replacement.      COMPONENTS USED:  DePuy Attune rotating platform posterior stabilized knee   system, a size 5 femur, 5 tibia, size 7 mm PS AOX insert, and 38 anatomic patellar   button.      SURGEON:  Pietro Cassis. Alvan Dame, M.D.      ASSISTANT:  Griffith Citron, PA-C.      ANESTHESIA:  Regional and Spinal.      SPECIMENS:  None.      COMPLICATION:  None.      DRAINS:  None.  EBL: <100 cc      TOURNIQUET TIME:  37 min at 250 mmHg     The patient was stable to the recovery room.      INDICATION FOR PROCEDURE:  Philip Mcneil is a 70 y.o. male patient of   mine.  The patient had been seen, evaluated, and treated for months conservatively in the   office with medication, activity modification, and injections.  The patient had   radiographic changes of bone-on-bone arthritis with endplate sclerosis and osteophytes noted.  Based on the radiographic changes and failed conservative measures, the patient   decided to proceed with definitive treatment, total knee replacement.  Risks of infection, DVT, component failure, need for revision surgery, neurovascular injury were reviewed in the office setting.  The  postop course was reviewed stressing the efforts to maximize post-operative satisfaction and function.  Consent was obtained for benefit of pain   relief.      PROCEDURE IN DETAIL:  The patient was brought to the operative theater.   Once adequate anesthesia, preoperative antibiotics, 2 gm of Ancef,1 gm of Tranexamic Acid, and 10 mg of Decadron administered, the patient was positioned supine with a left thigh tourniquet placed.  The  left lower extremity was prepped and draped in sterile fashion.  A time-   out was performed identifying the patient, planned procedure, and the appropriate extremity.      The left lower extremity was placed in the [facility] leg holder.  The leg was   exsanguinated, tourniquet elevated to 250 mmHg.  A midline incision was   made followed by median parapatellar arthrotomy.  Following initial   exposure, attention was  first directed to the patella.  Precut   measurement was noted to be 24 mm.  I resected down to 14 mm and used a   38 anatomic patellar button to restore patellar height as well as cover the cut surface.      The lug holes were drilled and a metal shim was placed to protect the   patella from retractors and saw blade during the procedure.      At this point, attention was now directed to the femur.  The femoral   canal was opened with a drill, irrigated to try to prevent fat emboli.  An   intramedullary rod was passed at 5 degrees valgus, 9 mm of bone was   resected off the distal femur.  Following this resection, the tibia was   subluxated anteriorly.  Using the extramedullary guide, 2 mm of bone was resected off   the proximal medial tibia.  We confirmed the gap would be   stable medially and laterally with a size 5 spacer block as well as confirmed that the tibial cut was perpendicular in the coronal plane, checking with an alignment rod.      Once this was done, I sized the femur to be a size 5 in the anterior-   posterior dimension, chose a  standard component based on medial and   lateral dimension.  The size 5 rotation block was then pinned in   position anterior referenced using the C-clamp to set rotation.  The   anterior, posterior, and  chamfer cuts were made without difficulty nor   notching making certain that I was along the anterior cortex to help   with flexion gap stability.      The final box cut was made off the lateral aspect of distal femur.      At this point, the tibia was sized to be a size 5.  The size 5 tray was   then pinned in position through the medial third of the tubercle,   drilled, and keel punched.  Trial reduction was now carried with a 5 femur,  5 tibia, a size 7 mm PS insert, and the 38 anatomic patella botton.  The knee was brought to full extension with good flexion stability with the patella   tracking through the trochlea without application of pressure.  Given   all these findings the trial components removed.  Final components were   opened and cement was mixed.  The knee was irrigated with normal saline solution and pulse lavage.  The synovial lining was   then injected with 30 cc of 0.25% Marcaine with epinephrine, 1 cc of Toradol and 30 cc of NS for a total of 61 cc.     Final implants were then cemented onto cleaned and dried cut surfaces of bone with the knee brought to extension with a size 7 mm PS trial insert.      Once the cement had fully cured, excess cement was removed   throughout the knee.  I confirmed that I was satisfied with the range of   motion and stability, and the final size 7 mm PS AOX insert was chosen.  It was   placed into the knee.      The tourniquet had been let down at 38 minutes.  No significant   hemostasis was required.  The extensor mechanism was then reapproximated using #1 Vicryl and #1 Stratafix sutures with the knee   in flexion.  The  remaining wound was closed with 2-0 Vicryl and running 4-0 Monocryl.   The knee was cleaned, dried, dressed  sterilely using Dermabond and   Aquacel dressing.  The patient was then   brought to recovery room in stable condition, tolerating the procedure   well.   Please note that Physician Assistant, Griffith Citron, PA-C was present for the entirety of the case, and was utilized for pre-operative positioning, peri-operative retractor management, general facilitation of the procedure and for primary wound closure at the end of the case.              Pietro Cassis Alvan Dame, M.D.    09/25/2020 9:55 AM

## 2020-09-25 NOTE — Anesthesia Postprocedure Evaluation (Signed)
Anesthesia Post Note  Patient: Philip Mcneil  Procedure(s) Performed: TOTAL KNEE ARTHROPLASTY (Left Knee)     Patient location during evaluation: PACU Anesthesia Type: Spinal Level of consciousness: awake Pain management: pain level controlled Vital Signs Assessment: post-procedure vital signs reviewed and stable Respiratory status: spontaneous breathing Cardiovascular status: stable Postop Assessment: no headache, no backache, spinal receding, patient able to bend at knees and no apparent nausea or vomiting Anesthetic complications: no   No complications documented.  Last Vitals:  Vitals:   09/25/20 1300 09/25/20 1328  BP: (!) 97/55 102/65  Pulse: 69 (!) 59  Resp: 11 18  Temp: 36.4 C (!) 36.3 C  SpO2: (!) 89% 99%    Last Pain:  Vitals:   09/25/20 1328  TempSrc: Oral  PainSc: 0-No pain                 Huston Foley

## 2020-09-25 NOTE — Anesthesia Preprocedure Evaluation (Signed)
Anesthesia Evaluation  Patient identified by MRN, date of birth, ID band Patient awake    Reviewed: Allergy & Precautions, NPO status , Patient's Chart, lab work & pertinent test results  Airway Mallampati: I       Dental no notable dental hx.    Pulmonary Patient abstained from smoking., former smoker,    Pulmonary exam normal        Cardiovascular hypertension, Pt. on medications Normal cardiovascular exam     Neuro/Psych PSYCHIATRIC DISORDERS Anxiety negative neurological ROS     GI/Hepatic Neg liver ROS, GERD  Medicated and Controlled,  Endo/Other  negative endocrine ROS  Renal/GU negative Renal ROS  negative genitourinary   Musculoskeletal   Abdominal Normal abdominal exam  (+)   Peds  Hematology negative hematology ROS (+)   Anesthesia Other Findings   Reproductive/Obstetrics                             Anesthesia Physical Anesthesia Plan  ASA: II  Anesthesia Plan: Spinal   Post-op Pain Management:  Regional for Post-op pain   Induction:   PONV Risk Score and Plan: 1 and Ondansetron, Midazolam, Dexamethasone and Propofol infusion  Airway Management Planned: Natural Airway and Simple Face Mask  Additional Equipment: None  Intra-op Plan:   Post-operative Plan:   Informed Consent: I have reviewed the patients History and Physical, chart, labs and discussed the procedure including the risks, benefits and alternatives for the proposed anesthesia with the patient or authorized representative who has indicated his/her understanding and acceptance.     Dental advisory given  Plan Discussed with: CRNA  Anesthesia Plan Comments:         Anesthesia Quick Evaluation

## 2020-09-25 NOTE — Evaluation (Signed)
Physical Therapy Evaluation Patient Details Name: Philip Mcneil MRN: [id number] DOB: January 13, 1951 Today's Date: 09/25/2020   History of Present Illness  70 YO male S/P LTKA 09/25/20, H/O R RCR, back and neck surgery, HTN  Clinical Impression  Initially patient reported decreased sensation and control of the legs. Upon standing , noted some sway. Patient stepped to recliner using RW. Patient wanted to stand again, Able to ambulate 25' without  Any balance losses or buckling.  patient should progress to DC home, has friend who is providing assistance as needed.  Pt admitted with above diagnosis.  Pt currently with functional limitations due to the deficits listed below (see PT Problem List). Pt will benefit from skilled PT to increase their independence and safety with mobility to allow discharge to the venue listed below.       Follow Up Recommendations Follow surgeon's recommendation for DC plan and follow-up therapies    Equipment Recommendations  3in1 (PT)    Recommendations for Other Services       Precautions / Restrictions Precautions Precautions: Fall;Knee Restrictions Weight Bearing Restrictions: No      Mobility  Bed Mobility Overal bed mobility: Needs Assistance Bed Mobility: Supine to Sit;Sit to Supine     Supine to sit: Supervision Sit to supine: Supervision   General bed mobility comments: no assistance    Transfers Overall transfer level: Needs assistance Equipment used: Rolling walker (2 wheeled) Transfers: Sit to/from [facility] Sit to Stand: Min assist Stand pivot transfers: Min assist       General transfer comment: initially noted decreased sability at hips and control of the legs. Took steps to recliner  Ambulation/Gait Ambulation/Gait assistance: Min Web designer (Feet): 40 Feet Assistive device: Rolling walker (2 wheeled) Gait Pattern/deviations: Step-to pattern;Step-through pattern     General Gait Details:  patient with noted enough stability to ambukate in room.  Stairs            Wheelchair Mobility    Modified Rankin (Stroke Patients Only)       Balance Overall balance assessment: History of Falls;Needs assistance Sitting-balance support: No upper extremity supported;Feet supported Sitting balance-Leahy Scale: Normal     Standing balance support: During functional activity;Bilateral upper extremity supported Standing balance-Leahy Scale: Fair                               Pertinent Vitals/Pain Pain Assessment: Faces Pain Score: 2  Pain Location: left knee Pain Descriptors / Indicators: Tender;Sore Pain Intervention(s): Monitored during session    Home Living Family/patient expects to be discharged to:: Private residence Living Arrangements: Alone Available Help at Discharge: Friend(s);Available 24 hours/day Type of Home: House Home Access: Stairs to enter   [employer] of Steps: 1 x 2 Home Layout: One level Home Equipment: Walker - 2 wheels      Prior Function Level of Independence: Independent with assistive device(s)         Comments: used cane  PTA     Hand Dominance   Dominant Hand: Right    Extremity/Trunk Assessment   Upper Extremity Assessment Upper Extremity Assessment: RUE deficits/detail RUE Deficits / Details: mild elevation dficits, H/O RCR    Lower Extremity Assessment Lower Extremity Assessment: LLE deficits/detail LLE Deficits / Details: +SLR, knee flexion to5-70       Communication   Communication: No difficulties  Cognition Arousal/Alertness: Awake/alert Behavior During Therapy: WFL for tasks assessed/performed Overall Cognitive Status: Within  Functional Limits for tasks assessed                                        General Comments      Exercises Total Joint Exercises Ankle Circles/Pumps: AROM;Both;10 reps;Supine Heel Slides: AROM;Left;5 reps;Supine Straight Leg Raises:  AROM;Left;10 reps;Supine   Assessment/Plan    PT Assessment Patient needs continued PT services  PT Problem List Decreased strength;Decreased mobility;Decreased activity tolerance;Decreased range of motion;Decreased knowledge of use of DME;Decreased knowledge of precautions       PT Treatment Interventions DME instruction;Therapeutic activities;Gait training;Therapeutic exercise;Patient/family education;Stair training;Functional mobility training    PT Goals (Current goals can be found in the Care Plan section)  Acute Rehab PT Goals Patient Stated Goal: to not have knee pain PT Goal Formulation: With patient Time For Goal Achievement: 10/02/20 Potential to Achieve Goals: Good    Frequency 7X/week   Barriers to discharge        Co-evaluation               AM-PAC PT "6 Clicks" Mobility  Outcome Measure Help needed turning from your back to your side while in a flat bed without using bedrails?: None Help needed moving from lying on your back to sitting on the side of a flat bed without using bedrails?: None Help needed moving to and from a bed to a chair (including a wheelchair)?: A Little Help needed standing up from a chair using your arms (e.g., wheelchair or bedside chair)?: A Little Help needed to walk in hospital room?: A Little Help needed climbing 3-5 steps with a railing? : A Little 6 Click Score: 20    End of Session Equipment Utilized During Treatment: Gait belt Activity Tolerance: Patient tolerated treatment well Patient left: in bed;with call bell/phone within reach;with bed alarm set Nurse Communication: Mobility status PT Visit Diagnosis: Unsteadiness on feet (R26.81);Difficulty in walking, not elsewhere classified (R26.2);Pain Pain - Right/Left: Left Pain - part of body: Knee    Time: 9741-6384 PT Time Calculation (min) (ACUTE ONLY): 33 min   Charges:   PT Evaluation $PT Eval Low Complexity: 1 Low PT Treatments $Gait Training: 8-22 mins         Tresa Endo PT Acute Rehabilitation Services Pager [phone] Office [phone]   Claretha Cooper 09/25/2020, 5:07 PM

## 2020-09-25 NOTE — Plan of Care (Signed)

## 2020-09-25 NOTE — Anesthesia Procedure Notes (Signed)
Anesthesia Regional Block: Adductor canal block   Pre-Anesthetic Checklist: ,, timeout performed, Correct Patient, Correct Site, Correct Laterality, Correct Procedure, Correct Position, site marked, Risks and benefits discussed,  Surgical consent,  Pre-op evaluation,  At surgeon's request and post-op pain management  Laterality: Lower and Left  Prep: chloraprep       Needles:  Injection technique: Single-shot  Needle Type: Echogenic Stimulator Needle     Needle Length: 9cm  Needle Gauge: 20   Needle insertion depth: 3 cm   Additional Needles:   Procedures:,,,, ultrasound used (permanent image in chart),,,,  Narrative:  Start time: 09/25/2020 9:25 AM End time: 09/25/2020 9:34 AM Injection made incrementally with aspirations every 5 mL.  Performed by: Personally  Anesthesiologist: Lyn Hollingshead, MD

## 2020-09-25 NOTE — Discharge Instructions (Signed)

## 2020-09-25 NOTE — Progress Notes (Signed)
Assisted Dr. Lyn Hollingshead with left Knee Adductor Canal block. Side rails up, monitors on throughout procedure. See vital signs in flow sheet. Tolerated Procedure well.

## 2020-09-25 NOTE — Transfer of Care (Signed)
Immediate Anesthesia Transfer of Care Note  Patient: Philip Mcneil  Procedure(s) Performed: TOTAL KNEE ARTHROPLASTY (Left Knee)  Patient Location: PACU  Anesthesia Type:Spinal and MAC combined with regional for post-op pain  Level of Consciousness: awake and patient cooperative  Airway & Oxygen Therapy: Patient Spontanous Breathing and Patient connected to face mask oxygen  Post-op Assessment: Report given to RN and Post -op Vital signs reviewed and stable  Post vital signs: Reviewed and stable  Last Vitals:  Vitals Value Taken Time  BP 108/64 09/25/20 1200  Temp    Pulse 87 09/25/20 1201  Resp 17 09/25/20 1202  SpO2 92 % 09/25/20 1201  Vitals shown include unvalidated device data.  Last Pain:  Vitals:   09/25/20 0755  TempSrc: Oral  PainSc:       Patients Stated Pain Goal: 2 (97/95/36 9223)  Complications: No complications documented.

## 2020-09-26 DIAGNOSIS — K219 Gastro-esophageal reflux disease without esophagitis: Secondary | ICD-10-CM | POA: Diagnosis not present

## 2020-09-26 DIAGNOSIS — Z6832 Body mass index (BMI) 32.0-32.9, adult: Secondary | ICD-10-CM | POA: Diagnosis not present

## 2020-09-26 DIAGNOSIS — R339 Retention of urine, unspecified: Secondary | ICD-10-CM | POA: Diagnosis present

## 2020-09-26 DIAGNOSIS — Z87891 Personal history of nicotine dependence: Secondary | ICD-10-CM | POA: Diagnosis not present

## 2020-09-26 DIAGNOSIS — Z20822 Contact with and (suspected) exposure to covid-19: Secondary | ICD-10-CM | POA: Diagnosis present

## 2020-09-26 DIAGNOSIS — Z79899 Other long term (current) drug therapy: Secondary | ICD-10-CM | POA: Diagnosis not present

## 2020-09-26 DIAGNOSIS — M1712 Unilateral primary osteoarthritis, left knee: Secondary | ICD-10-CM | POA: Diagnosis not present

## 2020-09-26 DIAGNOSIS — M659 Synovitis and tenosynovitis, unspecified: Secondary | ICD-10-CM | POA: Diagnosis not present

## 2020-09-26 DIAGNOSIS — E669 Obesity, unspecified: Secondary | ICD-10-CM | POA: Diagnosis not present

## 2020-09-26 DIAGNOSIS — I10 Essential (primary) hypertension: Secondary | ICD-10-CM | POA: Diagnosis present

## 2020-09-26 LAB — CBC
HCT: 37.4 % — ABNORMAL LOW (ref 39.0–52.0)
Hemoglobin: 12.4 g/dL — ABNORMAL LOW (ref 13.0–17.0)
MCH: 30.8 pg (ref 26.0–34.0)
MCHC: 33.2 g/dL (ref 30.0–36.0)
MCV: 93 fL (ref 80.0–100.0)
Platelets: 237 10*3/uL (ref 150–400)
RBC: 4.02 MIL/uL — ABNORMAL LOW (ref 4.22–5.81)
RDW: 13.2 % (ref 11.5–15.5)
WBC: 13.4 10*3/uL — ABNORMAL HIGH (ref 4.0–10.5)
nRBC: 0 % (ref 0.0–0.2)

## 2020-09-26 LAB — BASIC METABOLIC PANEL
Anion gap: 5 (ref 5–15)
BUN: 10 mg/dL (ref 8–23)
CO2: 24 mmol/L (ref 22–32)
Calcium: 8.8 mg/dL — ABNORMAL LOW (ref 8.9–10.3)
Chloride: 107 mmol/L (ref 98–111)
Creatinine, Ser: 0.76 mg/dL (ref 0.61–1.24)
GFR, Estimated: >60 mL/min (ref >60)
Glucose, Bld: 136 mg/dL — ABNORMAL HIGH (ref 70–99)
Potassium: 4 mmol/L (ref 3.5–5.1)
Sodium: 136 mmol/L (ref 135–145)

## 2020-09-26 LAB — GLUCOSE, CAPILLARY: Glucose-Capillary: 126 mg/dL — ABNORMAL HIGH (ref 70–99)

## 2020-09-26 MED ORDER — ASPIRIN 81 MG PO CHEW: 81.0000 mg | CHEWABLE_TABLET | Freq: Two times a day (BID) | ORAL | 0 refills | Status: AC

## 2020-09-26 MED ORDER — METHOCARBAMOL 500 MG PO TABS: 500.0000 mg | ORAL_TABLET | Freq: Four times a day (QID) | ORAL | 0 refills | Status: DC | PRN

## 2020-09-26 MED ORDER — SODIUM CHLORIDE 0.9 % IV BOLUS
500.0000 mL | Freq: Once | INTRAVENOUS | Status: AC
  Administered 2020-09-26: 500 mL via INTRAVENOUS

## 2020-09-26 MED ORDER — DOCUSATE SODIUM 100 MG PO CAPS: 100.0000 mg | ORAL_CAPSULE | Freq: Two times a day (BID) | ORAL | 0 refills | Status: DC

## 2020-09-26 MED ORDER — TAMSULOSIN HCL 0.4 MG PO CAPS
0.4000 mg | ORAL_CAPSULE | Freq: Every day | ORAL | Status: DC
  Administered 2020-09-26 – 2020-09-29 (×4): 0.4 mg via ORAL
  Filled 2020-09-26 (×4): qty 1

## 2020-09-26 MED ORDER — POLYETHYLENE GLYCOL 3350 17 G PO PACK: 17.0000 g | PACK | Freq: Every day | ORAL | 0 refills | Status: DC | PRN

## 2020-09-26 MED ORDER — HYDROCODONE-ACETAMINOPHEN 5-325 MG PO TABS: 1.0000 | ORAL_TABLET | ORAL | 0 refills | Status: DC | PRN

## 2020-09-26 NOTE — Progress Notes (Signed)
   Subjective: 1 Day Post-Op Procedure(s) (LRB): TOTAL KNEE ARTHROPLASTY (Left) Patient reports pain as mild.   Patient seen in rounds for Dr. Alvan Dame. Patient is well, and has had no acute complaints or problems. No acute events overnight. Foley catheter removed. Patient ambulated 40 feet with PT.  We will continue therapy today.   Objective: Vital signs in last 24 hours: Temp:  [97.4 F (36.3 C)-99.4 F (37.4 C)] 98.2 F (36.8 C) (05/11 0545) Pulse Rate:  [52-89] 61 (05/11 0545) Resp:  [10-18] 16 (05/11 0545) BP: (89-147)/(55-83) 124/70 (05/11 0545) SpO2:  [89 %-100 %] 96 % (05/11 0545) Weight:  [96.9 kg] 96.9 kg (05/10 1328)  Intake/Output from previous day:  Intake/Output Summary (Last 24 hours) at 09/26/2020 0749 Last data filed at 09/26/2020 0600 Gross per 24 hour  Intake 3477.47 ml  Output 1805 ml  Net 1672.47 ml     Intake/Output this shift: No intake/output data recorded.  Labs: Recent Labs    09/26/20 0326  HGB 12.4*   Recent Labs    09/26/20 0326  WBC 13.4*  RBC 4.02*  HCT 37.4*  PLT 237   Recent Labs    09/26/20 0326  NA 136  K 4.0  CL 107  CO2 24  BUN 10  CREATININE 0.76  GLUCOSE 136*  CALCIUM 8.8*   No results for input(s): LABPT, INR in the last 72 hours.  Exam: General - Patient is Alert and Oriented Extremity - Neurologically intact Sensation intact distally Intact pulses distally Dorsiflexion/Plantar flexion intact Dressing - dressing C/D/I Motor Function - intact, moving foot and toes well on exam.   Past Medical History:  Diagnosis Date  . A-fib ([place])   . Anxiety   . Arthritis   . Cancer ([place])   . GERD (gastroesophageal reflux disease)   . Hypertension     Assessment/Plan: 1 Day Post-Op Procedure(s) (LRB): TOTAL KNEE ARTHROPLASTY (Left) Active Problems:   S/P total knee arthroplasty, left  Estimated body mass index is 32.48 kg/m as calculated from the following:   Height as of this encounter: 5\' 8"  (1.727 m).    Weight as of this encounter: 96.9 kg. Advance diet Up with therapy D/C IV fluids   Patient's anticipated LOS is less than 2 midnights, meeting these requirements: - Younger than 57 - Lives within 1 hour of care - Has a competent adult at home to recover with post-op recover - NO history of  - Chronic pain requiring opiods  - Diabetes  - Coronary Artery Disease  - Heart failure  - Heart attack  - Stroke  - DVT/VTE  - Cardiac arrhythmia  - Respiratory Failure/COPD  - Renal failure  - Anemia  - Advanced Liver disease  DVT Prophylaxis - Aspirin Weight bearing as tolerated.  Hemoglobin stable at 12.4 this AM.   Plan is to go Home after hospital stay. Plan for discharge today following 1-2 sessions of PT as long as they are meeting their goals. Patient is scheduled for OPPT. Follow up in the office in 2 weeks.   Griffith Citron, PA-C Orthopedic Surgery [phone] 09/26/2020, 7:49 AM

## 2020-09-26 NOTE — Progress Notes (Signed)
Physical Therapy Treatment Patient Details Name: Philip Mcneil MRN: [id number] DOB: [birth year] Today's Date: 09/26/2020    History of Present Illness 70 YO male S/P LTKA 09/25/20, H/O R RCR, back and neck surgery, HTN    PT Comments    Patient reporting Pain 9/10, moslty laterally and behind knee. Left Leg does appear more edematous than this AM session.  Patient mobilized to sitting Stood and took 2 steps with increased pain reported. Assisted patient back to bed and encouraged to not  perform any further exercises. Ice applied to left knee.  BP supine 1143/71 , standing 134/71. Did not   Report dizziness.   Continue  PT in AM.    Follow surgeon's recommendation for DC plan and follow-up therapies     Equipment Recommendations  3in1 (PT)    Recommendations for Other Services       Precautions / Restrictions Precautions Precautions: Fall;Knee Precaution Comments: hypotensove/syncope after amb.in AM    Mobility  Bed Mobility   Bed Mobility: Supine to Sit;Sit to Supine     Supine to sit: Supervision Sit to supine: Supervision   General bed mobility comments: no assistance    Transfers   Equipment used: Rolling walker (2 wheeled) Transfers: Sit to/from Stand Sit to Stand: Min assist         General transfer comment: stood from bed , cues to  place the left leg forward prior to sitting.  Ambulation/Gait Ambulation/Gait assistance: Min assist           General Gait Details: took 2 steps forward and back, reports too much pain.   Stairs             Wheelchair Mobility    Modified Rankin (Stroke Patients Only)       Balance Overall balance assessment: Mild deficits observed, not formally tested Sitting-balance support: No upper extremity supported;Feet supported Sitting balance-Leahy Scale: Normal     Standing balance support: During functional activity;Bilateral upper extremity supported Standing balance-Leahy Scale: Poor Standing balance  comment: reliant on UE support                            Cognition Arousal/Alertness: Awake/alert                                            Exercises      General Comments        Pertinent Vitals/Pain Pain Score: 9  Pain Location: left knee Pain Descriptors / Indicators: Aching;Discomfort;Throbbing Pain Intervention(s): Monitored during session;Premedicated before session;Ice applied;Limited activity within patient's tolerance    Home Living                      Prior Function            PT Goals (current goals can now be found in the care plan section) Progress towards PT goals: Progressing toward goals    Frequency    7X/week      PT Plan Current plan remains appropriate    Co-evaluation              AM-PAC PT "6 Clicks" Mobility   Outcome Measure  Help needed turning from your back to your side while in a flat bed without using bedrails?: None Help needed moving from lying on your back to sitting  on the side of a flat bed without using bedrails?: None Help needed moving to and from a bed to a chair (including a wheelchair)?: A Little Help needed standing up from a chair using your arms (e.g., wheelchair or bedside chair)?: A Little Help needed to walk in hospital room?: A Little Help needed climbing 3-5 steps with a railing? : A Little 6 Click Score: 20    End of Session Equipment Utilized During Treatment: Gait belt Activity Tolerance: Patient limited by pain Patient left: in bed;with call bell/phone within reach Nurse Communication: Mobility status;Patient requests pain meds PT Visit Diagnosis: Unsteadiness on feet (R26.81);Difficulty in walking, not elsewhere classified (R26.2);Pain Pain - Right/Left: Left Pain - part of body: Knee     Time: 0938-1829 PT Time Calculation (min) (ACUTE ONLY): 24 min  Charges:  $Therapeutic Activity: 23-37 mins                     Tresa Endo PT Acute Rehabilitation  Services Pager [phone] Office [phone]    Claretha Cooper 09/26/2020, 4:13 PM

## 2020-09-26 NOTE — Progress Notes (Signed)
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 After return to sitting in recliner, patient complained of feeling dizzy , nausea and stated" I think I am going to passout."  Recliner leaned back, feet elevated. Patient  Then became rigid, jerking and unresponsive for ~ 15 seconds. RN called to room, BP 66/48 HR 34. Rapid response called by RN. Patient gradually improved and was assisted back into bed with stand pivot using reckiner. Will check on patient  This PM.   Last BP 126/69 HR 57.  Follow Up Recommendations  Follow surgeon's recommendation for DC plan and follow-up therapies     Equipment Recommendations  3in1 (PT)    Recommendations for Other Services       Precautions / Restrictions Precautions Precautions: Fall;Knee Precaution Comments: hypotensove/syncope after amb. Restrictions Weight Bearing Restrictions: No    Mobility  Bed Mobility   Bed Mobility: Supine to Sit     Supine to sit: Supervision     General bed mobility comments: no assistance    Transfers Overall transfer level: Needs assistance Equipment used: Rolling walker (2 wheeled) Transfers: Sit to/from [facility] Sit to Stand: Min assist Stand pivot transfers: Min assist       General transfer comment: From bed, verbal cues to stand using UE's. After return to sitting, began to complain of nausea and feeling like he would pass out  Ambulation/Gait Ambulation/Gait assistance: Min guard Gait Distance (Feet): 60 Feet Assistive device: Rolling walker (2 wheeled) Gait Pattern/deviations: Step-to pattern;Step-through pattern Gait velocity: decr   General Gait Details: patient  ambulated with smooth gait, initially required Verbal  cues for positioin inside RW..   Stairs             Wheelchair Mobility    Modified Rankin (Stroke Patients Only)       Balance Overall balance assessment: Mild deficits observed, not formally tested Sitting-balance support: No upper extremity supported;Feet supported Sitting balance-Leahy Scale: Normal     Standing balance support: During functional activity;Bilateral upper extremity supported Standing balance-Leahy Scale: Fair                              Cognition Arousal/Alertness: Awake/alert Behavior During Therapy: WFL for tasks assessed/performed Overall Cognitive Status: Within Functional Limits for tasks assessed                                 General Comments: became unresponsive for ~ 15 seconds when having syncopal episode      Exercises Total Joint Exercises Quad Sets: AROM;Both;10 reps;Supine Heel Slides: AROM;10 reps;Supine;Left Straight Leg Raises: AROM;10 reps;Left Long Arc Quad: AROM;Left;10 reps;Supine Goniometric ROM: 5-70 left knee    General Comments        Pertinent Vitals/Pain Faces Pain Scale: Hurts even more Pain Location: left knee Pain Descriptors / Indicators: Tender;Sore Pain Intervention(s): Monitored during session;Premedicated before session    Home Living                      Prior Function            PT Goals (current  goals can now be found in the care plan section) Progress towards PT goals: Progressing toward goals    Frequency    7X/week      PT Plan Current plan remains appropriate    Co-evaluation              AM-PAC PT "6 Clicks" Mobility   Outcome Measure  Help needed turning from your back to your side while in a flat bed without using bedrails?: None Help needed moving from lying on your back to sitting on the side of a flat bed without using bedrails?: None Help needed moving to and from a bed to a chair (including a wheelchair)?: A Little Help  needed standing up from a chair using your arms (e.g., wheelchair or bedside chair)?: A Little Help needed to walk in hospital room?: A Little Help needed climbing 3-5 steps with a railing? : A Little 6 Click Score: 20    End of Session Equipment Utilized During Treatment: Gait belt Activity Tolerance: Treatment limited secondary to medical complications (Comment) Patient left: in chair;with call bell/phone within reach;with nursing/sitter in room Nurse Communication: Mobility status (orthostatic hypostension) PT Visit Diagnosis: Unsteadiness on feet (R26.81);Difficulty in walking, not elsewhere classified (R26.2);Pain Pain - Right/Left: Left Pain - part of body: Knee     Time: 1010-1040 PT Time Calculation (min) (ACUTE ONLY): 30 min  Charges:  $Gait Training: 8-22 mins $Therapeutic Exercise: 8-22 mins                     Tresa Endo PT Acute Rehabilitation Services Pager [phone] Office [phone]   Claretha Cooper 09/26/2020, 11:18 AM

## 2020-09-26 NOTE — Progress Notes (Signed)
Therapist ambulated pt in hall, upon getting him to chair pt passed out very pale, clammy. Bp dropped to 62/40 HR dropped to low 30s. Bolus of NS started and rapid response nurse called. Rapid nurse came in assessed pt, pt more alert and HR increased back up to upper 50s. No other orders besides bolus at this time. EKG done as well. PT assisted back to bed by therapist and Clerance Lav, RN. Pt now alert and oriented, states he does feel better. Continuous pulse ox in place.

## 2020-09-26 NOTE — TOC Transition Note (Signed)
Transition of Care [facility]) - CM/SW Discharge Note   Patient Details  Name: Philip Mcneil MRN: [id number] Date of Birth: 1950/12/26  Transition of Care [facility]) CM/SW Contact:  Lennart Pall, LCSW Phone Number: 09/26/2020, 9:27 AM   Clinical Narrative:     Met briefly with pt who confirms receipt of 3n1 via [facility].  Plan for OPPT via [facility]. No TOC needs.  Final next level of care: OP Rehab Barriers to Discharge: No Barriers Identified   Patient Goals and CMS Choice Patient states their goals for this hospitalization and ongoing recovery are:: return home      Discharge Placement                       Discharge Plan and Services                DME Arranged: 3-N-1 DME Agency: [facility]                  Social Determinants of Health (SDOH) Interventions     Readmission Risk Interventions No flowsheet data found.

## 2020-09-27 ENCOUNTER — Encounter (HOSPITAL_COMMUNITY): Payer: Self-pay | Admitting: Orthopedic Surgery

## 2020-09-27 LAB — CBC
HCT: 37.4 % — ABNORMAL LOW (ref 39.0–52.0)
Hemoglobin: 12.2 g/dL — ABNORMAL LOW (ref 13.0–17.0)
MCH: 30.3 pg (ref 26.0–34.0)
MCHC: 32.6 g/dL (ref 30.0–36.0)
MCV: 93 fL (ref 80.0–100.0)
Platelets: 220 10*3/uL (ref 150–400)
RBC: 4.02 MIL/uL — ABNORMAL LOW (ref 4.22–5.81)
RDW: 13.5 % (ref 11.5–15.5)
WBC: 11.1 10*3/uL — ABNORMAL HIGH (ref 4.0–10.5)
nRBC: 0 % (ref 0.0–0.2)

## 2020-09-27 MED ORDER — CHLORHEXIDINE GLUCONATE CLOTH 2 % EX PADS
6.0000 | MEDICATED_PAD | Freq: Every day | CUTANEOUS | Status: DC
  Administered 2020-09-28: 6 via TOPICAL

## 2020-09-27 MED ORDER — SODIUM CHLORIDE 0.9 % IV BOLUS
250.0000 mL | Freq: Once | INTRAVENOUS | Status: AC
  Administered 2020-09-27: 250 mL via INTRAVENOUS

## 2020-09-27 MED ORDER — KETOROLAC TROMETHAMINE 15 MG/ML IJ SOLN
15.0000 mg | Freq: Four times a day (QID) | INTRAMUSCULAR | Status: AC
  Administered 2020-09-27 – 2020-09-28 (×4): 15 mg via INTRAVENOUS
  Filled 2020-09-27 (×4): qty 1

## 2020-09-27 NOTE — Progress Notes (Signed)
Went in pt's room with NT for vitals and final rounds for this shift. Pt in bed, Pt very pale and clammy, states he feels like he is going to pass out. Vitals obtained by NT BP 60/40, HR 39 (obtained via dynamap). Pt slow to respond but still alert.   Same situation occurred yesterday.   Started 500cc bolus and called Rapid Response Nurse  0658-Rapid response nurse came and assessed pt. Pt started to regain color. More alert and responsive.  0700: BP up to 116/79, HR 89  0720: Rapid Response Nurse initially recommended telemetry but after talking more with pt, pt might have had a vagal response. Per Rapid Response Nurse- Sarah RN, continue maintenance fluids and monitor BP's closely.

## 2020-09-27 NOTE — Progress Notes (Signed)
Received verbal order to hold BP meds this AM due to low BP.  Dr. Alvan Dame gave the following verbal order:  Hold all BP meds until systolic number is 193 or greater. Was also advised to hold the Norco meds. Toradol and Robaxin meds given instead of Norco.   Still waiting on a bed to open up to transfer patient to telemetry. Will continue to monitor.

## 2020-09-27 NOTE — Plan of Care (Signed)
  Problem: Education: Goal: Knowledge of General Education information will improve Description Including pain rating scale, medication(s)/side effects and non-pharmacologic comfort measures Outcome: Progressing   

## 2020-09-27 NOTE — Progress Notes (Signed)
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 BP post mobilizing to recliner 146/88, HR 95.  Left thigh ,knee and lower leg edematous. Patient reports having performed SLR and knee flexion.  Limited ROM this visit. Getting pain meds at this time. Patient  Noted shivering, stating that he was cold.  continue PT with close monitoring of VS.   Follow Up Recommendations  Follow surgeon's recommendation for DC plan and follow-up therapies     Equipment Recommendations  3in1 (PT)    Recommendations for Other Services       Precautions / Restrictions Precautions Precautions: Fall;Knee Precaution Comments: hypotensove/syncopex 2 and brady to 30's Restrictions LLE Weight Bearing: Weight bearing as tolerated    Mobility  Bed Mobility   Bed Mobility: Supine to Sit     Supine to sit: Supervision;HOB elevated     General bed mobility comments: no assistance    Transfers Overall transfer level: Needs assistance Equipment used: Rolling walker (2 wheeled) Transfers: Sit to/from [facility] Sit to Stand: Min assist Stand pivot transfers: Min assist       General transfer comment: cues for hand and Left leg position., Took a few steps to recliner.  Ambulation/Gait             General Gait Details: NT   Stairs             Wheelchair Mobility    Modified Rankin (Stroke Patients Only)       Balance Overall balance assessment: Mild deficits observed, not formally tested Sitting-balance support: No upper extremity supported;Feet supported Sitting balance-Leahy Scale: Normal     Standing balance support: During functional activity;Bilateral upper extremity supported                                Cognition  Arousal/Alertness: Awake/alert                                            Exercises      General Comments        Pertinent Vitals/Pain Pain Score: 6  Pain Location: left knee Pain Descriptors / Indicators: Aching;Discomfort;Throbbing Pain Intervention(s): Monitored during session;RN gave pain meds during session;Limited activity within patient's tolerance;Ice applied    Home Living                      Prior Function            PT Goals (current goals can now be found in the care plan section) Progress towards PT goals: Progressing toward goals    Frequency    7X/week      PT Plan Current plan remains appropriate    Co-evaluation              AM-PAC PT "6 Clicks" Mobility   Outcome Measure  Help needed turning from your back to your side while in a flat bed without using bedrails?: None Help needed moving from lying on your back to sitting on the side of a flat bed without using bedrails?: None Help needed moving to  and from a bed to a chair (including a wheelchair)?: A Little Help needed standing up from a chair using your arms (e.g., wheelchair or bedside chair)?: A Little Help needed to walk in hospital room?: A Little Help needed climbing 3-5 steps with a railing? : A Little 6 Click Score: 20    End of Session Equipment Utilized During Treatment: Gait belt Activity Tolerance: Patient tolerated treatment well Patient left: in chair;with call bell/phone within reach Nurse Communication: Mobility status;Patient requests pain meds PT Visit Diagnosis: Unsteadiness on feet (R26.81);Difficulty in walking, not elsewhere classified (R26.2);Pain Pain - Right/Left: Left Pain - part of body: Knee     Time: 1640-1710 PT Time Calculation (min) (ACUTE ONLY): 30 min  Charges:  $Therapeutic Activity: 23-37 mins                     Tresa Endo PT Acute Rehabilitation Services Pager [phone] Office [phone]    Claretha Cooper 09/27/2020, 5:14 PM

## 2020-09-27 NOTE — Progress Notes (Signed)
Rapid response just saw patient. Just received verbal order from Griffith Citron, [place] to transfer patient to telemetry.

## 2020-09-27 NOTE — Progress Notes (Signed)
Attempted to call report. Nurse stated she is tied up and to call back in 7 minutes.

## 2020-09-27 NOTE — Progress Notes (Signed)
MEWS score now green. However, will continue to monitor per RED MEWS score protocol.

## 2020-09-27 NOTE — Progress Notes (Signed)
  5/11 at 2202: Bladder scanned pt d/t no urine output since 3:00pm. Pt also states he feels a lot of pressure and the urge to urinate but unable to.  Bladder scan showed a volume of 486. Per report, pt was straight cath x1 around noon.   5/11 at 23:25: Myself and charge nurse unsuccessful with straight cath(23F)- met resistance and would not advance any further. Notified on call PA Mechele Claude).   5/12 at 0031: Charge nurse ([place], RN) from urology came and attempted and was  unsuccessful. Bladder scanned pt again, 719 ml in bladder. [place] RN notified on call urology MD who recommended a 60F catheter.   Notified PA Ollis, orders given for a foley. Foley 60F inserted by [place], Therapist, sports.

## 2020-09-27 NOTE — Progress Notes (Signed)
RED MEWS protocol implemented due to BP: 60/40 and heart rate 39.   Will monitor vitals per RED MEWS protcol: Every 1 hour x 4 Then every 4 hours x4

## 2020-09-28 MED ORDER — LOTEPREDNOL ETABONATE 0.5 % OP SUSP
1.0000 [drp] | Freq: Every day | OPHTHALMIC | Status: DC
  Administered 2020-09-29: 1 [drp] via OPHTHALMIC
  Filled 2020-09-28: qty 5

## 2020-09-28 MED ORDER — FLEET ENEMA 7-19 GM/118ML RE ENEM: 1.0000 | ENEMA | Freq: Every day | RECTAL | Status: DC | PRN

## 2020-09-28 NOTE — Progress Notes (Signed)
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 Pt  Concerned about voiding. Pt reports his son and/or brother are coming to assist this weekend but he is not sure when. Continue to follow for PT     Follow Up Recommendations  Follow surgeon's recommendation for DC plan and follow-up therapies     Equipment Recommendations  3in1 (PT)    Recommendations for Other Services       Precautions / Restrictions Precautions Precautions: Fall;Knee Precaution Comments: hypotensove/syncopex 2 and brady to 30's Restrictions Weight Bearing Restrictions: No LLE Weight Bearing: Weight bearing as tolerated    Mobility  Bed Mobility Overal bed mobility: Needs Assistance Bed Mobility: Supine to Sit     Supine to sit: Supervision;HOB elevated     General bed mobility comments: supervision for safety    Transfers Overall transfer level: Needs assistance Equipment used: Rolling walker (2 wheeled) Transfers: Sit to/from Stand Sit to Stand: Min guard;Supervision         General transfer comment: cues for hand placement, LLE position  Ambulation/Gait Ambulation/Gait assistance: Min guard Gait Distance (Feet): 65 Feet Assistive device: Rolling walker (2 wheeled) Gait Pattern/deviations: Step-to pattern;Step-through pattern;Decreased stance time - left Gait velocity: decr   General Gait Details: cues for sequence, RW position; distance limited by PT d/t incr HR, max 136 and DOE 2-3/4   Marine scientist Rankin (Stroke Patients Only)       Balance Overall balance assessment: Mild deficits observed, not formally tested   Sitting balance-Leahy Scale: Normal     Standing balance support: During functional  activity;Bilateral upper extremity supported Standing balance-Leahy Scale: Poor Standing balance comment: reliant on UE support                            Cognition Arousal/Alertness: Awake/alert Behavior During Therapy: WFL for tasks assessed/performed Overall Cognitive Status: Within Functional Limits for tasks assessed                                 General Comments: denies dizziness today      Exercises Total Joint Exercises Ankle Circles/Pumps: AROM;Both;10 reps;Supine Quad Sets: AROM;Both;10 reps;Supine    General Comments        Pertinent Vitals/Pain Pain Assessment: Faces Faces Pain Scale: Hurts even more Pain Location: left knee Pain Descriptors / Indicators: Grimacing;Guarding Pain Intervention(s): Limited activity within patient's tolerance;Monitored during session;Premedicated before session;Repositioned    Home Living Family/patient expects to be discharged to:: Private residence Living Arrangements: Alone Available Help at Discharge: Friend(s);Available 24 hours/day Type of Home: House Home Access: Stairs to enter   Home Layout: One level Home Equipment: Walker - 2 wheels Additional Comments: Pt plans to sponge bathe at home; reports he cannot fit a shower seat in shower. Pt also reports his older brother will be staying with him initially. He also identies a neighbor that can give him a ride home    Prior Function Level of Independence: Independent with assistive device(s)      Comments: used cane  PTA   PT Goals (current  goals can now be found in the care plan section) Acute Rehab PT Goals Patient Stated Goal: home, mobilize better PT Goal Formulation: With patient Time For Goal Achievement: 10/02/20 Potential to Achieve Goals: Good Progress towards PT goals: Progressing toward goals    Frequency    7X/week      PT Plan Current plan remains appropriate    Co-evaluation   Reason for Co-Treatment: For  patient/therapist safety;To address functional/ADL transfers   OT goals addressed during session: ADL's and self-care      AM-PAC PT "6 Clicks" Mobility   Outcome Measure  Help needed turning from your back to your side while in a flat bed without using bedrails?: None Help needed moving from lying on your back to sitting on the side of a flat bed without using bedrails?: None Help needed moving to and from a bed to a chair (including a wheelchair)?: A Little Help needed standing up from a chair using your arms (e.g., wheelchair or bedside chair)?: A Little Help needed to walk in hospital room?: A Little Help needed climbing 3-5 steps with a railing? : A Little 6 Click Score: 20    End of Session Equipment Utilized During Treatment: Gait belt Activity Tolerance: Patient tolerated treatment well Patient left: in chair;with call bell/phone within reach;with chair alarm set Nurse Communication: Mobility status;Patient requests pain meds PT Visit Diagnosis: Unsteadiness on feet (R26.81);Difficulty in walking, not elsewhere classified (R26.2);Pain Pain - Right/Left: Left Pain - part of body: Knee     Time: 5465-0354 PT Time Calculation (min) (ACUTE ONLY): 40 min  Charges:                        Baxter Flattery, PT  Acute Rehab Dept [facility]) [phone] Pager [phone]  09/28/2020    [facility] 09/28/2020, 2:01 PM

## 2020-09-28 NOTE — Progress Notes (Signed)
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 Tolerated HEP well. Pt to continue work on knee ROM/flexion after rest break. Will see again in am, should be ready to d/c tomorrow from PT standpoint.   Follow Up Recommendations  Follow surgeon's recommendation for DC plan and follow-up therapies     Equipment Recommendations  3in1 (PT)    Recommendations for Other Services       Precautions / Restrictions Precautions Precautions: Fall;Knee Precaution Comments: hypotensove/syncopex 2 and brady to 30's Restrictions Weight Bearing Restrictions: No LLE Weight Bearing: Weight bearing as tolerated    Mobility  Bed Mobility Overal bed mobility: Needs Assistance Bed Mobility: Supine to Sit;Sit to Supine     Supine to sit: Supervision Sit to supine: Supervision   General bed mobility comments: supervision for safety    Transfers Overall transfer level: Needs assistance Equipment used: Rolling walker (2 wheeled) Transfers: Sit to/from Stand Sit to Stand: Min guard;Supervision         General transfer comment: cues for hand placement, LLE position  Ambulation/Gait Ambulation/Gait assistance: Min guard Gait Distance (Feet): 68 Feet Assistive device: Rolling walker (2 wheeled) Gait Pattern/deviations: Step-to pattern;Step-through pattern;Decreased stance time - left Gait velocity: decr   General Gait Details: cues for sequence, RW position; distance limited by PT d/t incr HR, max 136 and DOE 2-3/4   Marine scientist Rankin (Stroke Patients Only)       Balance Overall balance assessment: Mild deficits observed, not formally tested   Sitting balance-Leahy Scale: Normal     Standing balance  support: During functional activity;Bilateral upper extremity supported Standing balance-Leahy Scale: Poor Standing balance comment: reliant on UE support                            Cognition Arousal/Alertness: Awake/alert Behavior During Therapy: WFL for tasks assessed/performed Overall Cognitive Status: Within Functional Limits for tasks assessed                                 General Comments: denies dizziness with mobility      Exercises Total Joint Exercises Ankle Circles/Pumps: AROM;Both;10 reps;Supine Quad Sets: AROM;Both;10 reps;Supine Heel Slides: AROM;10 reps;Supine;Left Straight Leg Raises: AROM;10 reps;Left;AAROM Goniometric ROM: grossly 10 to 65 degrees knee flexion    General Comments        Pertinent Vitals/Pain Pain Assessment: Faces Faces Pain Scale: Hurts little more Pain Location: left knee Pain Descriptors / Indicators: Grimacing;Guarding;Sore Pain Intervention(s): Limited activity within patient's tolerance;Monitored during session;Repositioned    Home Living Family/patient expects to be discharged to:: Private residence Living Arrangements: Alone Available Help at Discharge: Friend(s);Available 24 hours/day Type of Home: House Home Access: Stairs to enter   Home Layout: One level Home Equipment: Walker - 2 wheels Additional Comments: Pt plans to sponge bathe at home; reports he cannot fit a shower seat in shower. Pt also reports his older brother will be staying with him initially. He also identies a neighbor that can give him a ride home    Prior Function Level of Independence: Independent  with assistive device(s)      Comments: used cane  PTA   PT Goals (current goals can now be found in the care plan section) Acute Rehab PT Goals Patient Stated Goal: home, mobilize better PT Goal Formulation: With patient Time For Goal Achievement: 10/02/20 Potential to Achieve Goals: Good Progress towards PT goals: Progressing  toward goals    Frequency    7X/week      PT Plan Current plan remains appropriate    Co-evaluation   Reason for Co-Treatment: For patient/therapist safety;To address functional/ADL transfers   OT goals addressed during session: ADL's and self-care      AM-PAC PT "6 Clicks" Mobility   Outcome Measure  Help needed turning from your back to your side while in a flat bed without using bedrails?: None Help needed moving from lying on your back to sitting on the side of a flat bed without using bedrails?: None Help needed moving to and from a bed to a chair (including a wheelchair)?: A Little Help needed standing up from a chair using your arms (e.g., wheelchair or bedside chair)?: A Little Help needed to walk in hospital room?: A Little Help needed climbing 3-5 steps with a railing? : A Little 6 Click Score: 20    End of Session Equipment Utilized During Treatment: Gait belt Activity Tolerance: Patient tolerated treatment well Patient left: in chair;with call bell/phone within reach;with chair alarm set Nurse Communication: Mobility status;Patient requests pain meds PT Visit Diagnosis: Unsteadiness on feet (R26.81);Difficulty in walking, not elsewhere classified (R26.2);Pain Pain - Right/Left: Left Pain - part of body: Knee     Time: 6734-1937 PT Time Calculation (min) (ACUTE ONLY): 16 min  Charges:  $Gait Training: 8-22 mins                     Baxter Flattery, PT  Acute Rehab Dept ([place]) [phone] Pager [phone]  09/28/2020    [facility] 09/28/2020, 3:29 PM

## 2020-09-28 NOTE — Progress Notes (Signed)
Patient ID: Philip Mcneil, male   DOB: [birth year], 70 y.o.   MRN: [id number] Subjective: 2 Days Post-Op Procedure(s) (LRB): TOTAL KNEE ARTHROPLASTY (Left)    Patient reports pain as moderate.  Episodes of vagal response to pain reviewed. Currently stable without significant complaints  Objective:   VITALS:   Vitals:   09/28/20 0152 09/28/20 0611  BP: 117/64 129/78  Pulse: 71 69  Resp: 16 16  Temp: 98.3 F (36.8 C) 98.9 F (37.2 C)  SpO2: 96% 97%    Neurovascular intact Incision: dressing C/D/I  LABS Recent Labs    09/26/20 0326 09/27/20 0305  HGB 12.4* 12.2*  HCT 37.4* 37.4*  WBC 13.4* 11.1*  PLT 237 220    Recent Labs    09/26/20 0326  NA 136  K 4.0  BUN 10  CREATININE 0.76  GLUCOSE 136*    No results for input(s): LABPT, INR in the last 72 hours.   Assessment/Plan: 3 Days Post-Op Procedure(s) (LRB): TOTAL KNEE ARTHROPLASTY (Left)   Advance diet Up with therapy  Some thoughts to transfer to telemetry to monitor heart though this appears to be more of pain related response.  His labs have remained stable vitals currently normal. Discharge pending safety with PT

## 2020-09-28 NOTE — Care Management Important Message (Signed)
Important Message  Patient Details IM Letter given to the Patient. Name: Philip Mcneil MRN: [id number] Date of Birth: 02/15/1951   Medicare Important Message Given:  Yes     Kerin Salen 09/28/2020, 1:22 PM

## 2020-09-28 NOTE — Progress Notes (Signed)
Subjective: 3 Days Post-Op Procedure(s) (LRB): TOTAL KNEE ARTHROPLASTY (Left) Patient reports pain as mild.   Patient seen in rounds with Dr. Alvan Dame. Patient is well, and has had no acute complaints or problems. Patient had another syncopal episode yesterday, and was transferred to telemetry. He was able to move to the recliner yesterday without issues. Foley catheter in place. He has not had a bowel movement, but reports positive flatus. He will now have his brother home with him 24/7 for a week or two when he leaves.   We will continue therapy today.   Objective: Vital signs in last 24 hours: Temp:  [97.8 F (36.6 C)-99.3 F (37.4 C)] 98.9 F (37.2 C) (05/13 0611) Pulse Rate:  [56-93] 69 (05/13 0611) Resp:  [14-20] 16 (05/13 0611) BP: (104-144)/(54-80) 129/78 (05/13 0611) SpO2:  [96 %-99 %] 97 % (05/13 0611)  Intake/Output from previous day:  Intake/Output Summary (Last 24 hours) at 09/28/2020 0734 Last data filed at 09/28/2020 0518 Gross per 24 hour  Intake 350.41 ml  Output 2200 ml  Net -1849.59 ml     Intake/Output this shift: No intake/output data recorded.  Labs: Recent Labs    09/26/20 0326 09/27/20 0305  HGB 12.4* 12.2*   Recent Labs    09/26/20 0326 09/27/20 0305  WBC 13.4* 11.1*  RBC 4.02* 4.02*  HCT 37.4* 37.4*  PLT 237 220   Recent Labs    09/26/20 0326  NA 136  K 4.0  CL 107  CO2 24  BUN 10  CREATININE 0.76  GLUCOSE 136*  CALCIUM 8.8*   No results for input(s): LABPT, INR in the last 72 hours.  Exam: General - Patient is Alert and Oriented Extremity - Neurologically intact Sensation intact distally Intact pulses distally Dorsiflexion/Plantar flexion intact Dressing - dressing C/D/I Motor Function - intact, moving foot and toes well on exam.   Past Medical History:  Diagnosis Date  . A-fib ([place])   . Anxiety   . Arthritis   . Cancer ([place])   . GERD (gastroesophageal reflux disease)   . Hypertension     Assessment/Plan: 3  Days Post-Op Procedure(s) (LRB): TOTAL KNEE ARTHROPLASTY (Left) Active Problems:   S/P total knee arthroplasty, left  Estimated body mass index is 32.48 kg/m as calculated from the following:   Height as of this encounter: 5\' 8"  (1.727 m).   Weight as of this encounter: 96.9 kg. Advance diet Up with therapy  Anticipated LOS equal to or greater than 2 midnights due to - Age 13 and older with one or more of the following:  - Obesity  - Expected need for hospital services (PT, OT, Nursing) required for safe  discharge  - Anticipated need for postoperative skilled nursing care or inpatient rehab  - Active co-morbidities: , OR   - Unanticipated findings during/Post Surgery: None  - Patient is a high risk of re-admission due to: None    DVT Prophylaxis - Aspirin Weight bearing as tolerated.  Plan is to go Home after hospital stay. Plan to get up with PT today to work on mobilization and safe ambulation. If he makes significant progression, he may discharge home today. Otherwise, plan for discharge tomorrow. He will have his brother with him 24/7 at home, which will be very helpful.   In terms of his urinary retention issues, he does wish to try a foley trial again today which Dr. Alvan Dame agreed with. If this fails, he will need a new foley placed. We will continue  his daily Flomax. He has seen Dr. Beatrix Fetters previously, and we will arrange to have him follow up with him a few days after discharge.   Griffith Citron, PA-C Orthopedic Surgery [phone] 09/28/2020, 7:34 AM

## 2020-09-28 NOTE — Evaluation (Signed)
Occupational Therapy Evaluation Patient Details Name: Philip Mcneil MRN: [id number] DOB: [birth year] Today's Date: 09/28/2020    History of Present Illness 70 YO male S/P LTKA 09/25/20, H/O R RCR, back and neck surgery, HTN   Clinical Impression   Pt admitted with the above diagnoses and presents with below problem list. Pt will benefit from continued acute OT to address the below listed deficits and maximize independence with basic ADLs prior to d/c home. At baseline pt is mod I with ADLs (cane), lives alone. Pt currently needs up to min A with LB ADLs, min guard-light min A for functional transfers and household distance mobility. Pt with HR 125 upon initial stand EOB, HR up to 138 with mobility. Pt denied feeling lightheaded or dizzy this session. Pt reports his older brother will be staying with him initially. Educated on LB bathing/dressing techniques.     Follow Up Recommendations  Follow surgeon's recommendation for DC plan and follow-up therapies;Supervision - Intermittent (OOB/mobility)    Equipment Recommendations  3 in 1 bedside commode (already delivered to room)    Recommendations for Other Services       Precautions / Restrictions Precautions Precautions: Fall;Knee Precaution Comments: hypotensove/syncopex 2 and brady to 30's Restrictions Weight Bearing Restrictions: No LLE Weight Bearing: Weight bearing as tolerated      Mobility Bed Mobility Overal bed mobility: Needs Assistance Bed Mobility: Supine to Sit     Supine to sit: Supervision;HOB elevated     General bed mobility comments: supervision for safety    Transfers Overall transfer level: Needs assistance Equipment used: Rolling walker (2 wheeled) Transfers: Sit to/from Stand Sit to Stand: Min guard;Min assist         General transfer comment: up to min A to steady during transitions and control descent into recliner. from EOB, to/from EOB    Balance Overall balance assessment: Mild deficits  observed, not formally tested   Sitting balance-Leahy Scale: Normal     Standing balance support: During functional activity;Bilateral upper extremity supported Standing balance-Leahy Scale: Poor Standing balance comment: reliant on UE support                           ADL either performed or assessed with clinical judgement   ADL Overall ADL's : Needs assistance/impaired Eating/Feeding: Set up;Sitting   Grooming: Set up;Sitting   Upper Body Bathing: Set up;Sitting   Lower Body Bathing: Min guard;Minimal assistance;Sit to/from stand   Upper Body Dressing : Set up;Sitting   Lower Body Dressing: Min guard;Minimal assistance;Sit to/from stand   Toilet Transfer: Min guard;Minimal assistance;Ambulation;RW;BSC Toilet Transfer Details (indicate cue type and reason): BSC over toilet Toileting- Clothing Manipulation and Hygiene: Set up;Min guard;Minimal assistance;Sitting/lateral lean;Sit to/from stand     Tub/Shower Transfer Details (indicate cue type and reason): plans to sponge bathe at home d/t small unlevel shower Functional mobility during ADLs: Min guard;+2 for safety/equipment General ADL Comments: Pt completed household distance functional mobility with extra time/effort. Needs up to min A with LB ADLs. +2 for safety for lines and chair follow helpful. HR 125 upon standing, 138 walking. Educated on technique for LB bathing/dressing     Museum/gallery curator      Pertinent Vitals/Pain Pain Assessment: Faces Faces Pain Scale: Hurts even more Pain Location: left knee Pain Descriptors / Indicators: Grimacing;Guarding Pain Intervention(s): Monitored during session;Limited activity within patient's tolerance;Repositioned;Premedicated before session (premedicated per  pt report)     Hand Dominance Right   Extremity/Trunk Assessment Upper Extremity Assessment Upper Extremity Assessment: RUE deficits/detail RUE Deficits / Details: mild elevation  dficits, H/O RCR   Lower Extremity Assessment Lower Extremity Assessment: Defer to PT evaluation       Communication Communication Communication: No difficulties   Cognition Arousal/Alertness: Awake/alert Behavior During Therapy: WFL for tasks assessed/performed Overall Cognitive Status: Within Functional Limits for tasks assessed                                     General Comments       Exercises     Shoulder Instructions      Home Living Family/patient expects to be discharged to:: Private residence Living Arrangements: Alone Available Help at Discharge: Friend(s);Available 24 hours/day Type of Home: House Home Access: Stairs to enter [employer] of Steps: 1 x 2   Home Layout: One level     Bathroom Shower/Tub: Walk-in shower;Other (comment) ("it's tiny" "it slopes down. It's 70 years old.")   Bathroom Toilet: Standard     Home Equipment: Walker - 2 wheels   Additional Comments: Pt plans to sponge bathe at home; reports he cannot fit a shower seat in shower. Pt also reports his older brother will be staying with him initially. He also identies a neighbor that can give him a ride home      Prior Functioning/Environment Level of Independence: Independent with assistive device(s)        Comments: used cane  PTA        OT Problem List: Impaired balance (sitting and/or standing);Decreased knowledge of use of DME or AE;Decreased knowledge of precautions;Pain      OT Treatment/Interventions: Self-care/ADL training;DME and/or AE instruction;Therapeutic activities;Patient/family education;Balance training    OT Goals(Current goals can be found in the care plan section) Acute Rehab OT Goals Patient Stated Goal: home, mobilize better OT Goal Formulation: With patient Time For Goal Achievement: 10/12/20 Potential to Achieve Goals: Good ADL Goals Pt Will Perform Lower Body Bathing: with modified independence;sit to/from stand Pt Will  Perform Lower Body Dressing: with modified independence;sit to/from stand Pt Will Transfer to Toilet: with modified independence;ambulating Pt Will Perform Toileting - Clothing Manipulation and hygiene: with modified independence;sit to/from stand  OT Frequency: Min 2X/week   Barriers to D/C:            Co-evaluation PT/OT/SLP Co-Evaluation/Treatment: Yes Reason for Co-Treatment: For patient/therapist safety   OT goals addressed during session: ADL's and self-care      AM-PAC OT "6 Clicks" Daily Activity     Outcome Measure Help from another person eating meals?: None Help from another person taking care of personal grooming?: None Help from another person toileting, which includes using toliet, bedpan, or urinal?: A Little Help from another person bathing (including washing, rinsing, drying)?: A Little Help from another person to put on and taking off regular upper body clothing?: None Help from another person to put on and taking off regular lower body clothing?: A Little 6 Click Score: 21   End of Session Equipment Utilized During Treatment: Rolling walker  Activity Tolerance: Other (comment);Patient tolerated treatment well (HR 125 upon standing, up to 138 walking.) Patient left: in chair;with call bell/phone within reach;with chair alarm set  OT Visit Diagnosis: Unsteadiness on feet (R26.81);Pain                Time: 1000-1023  OT Time Calculation (min): 23 min Charges:  OT General Charges $OT Visit: 1 Visit OT Evaluation $OT Eval Low Complexity: [place], OT Acute Rehabilitation Services Pager: [phone] Office: [phone]   Hortencia Pilar 09/28/2020, 1:32 PM

## 2020-09-29 LAB — CREATININE, SERUM
Creatinine, Ser: 0.61 mg/dL (ref 0.61–1.24)
GFR, Estimated: >60 mL/min (ref >60)

## 2020-09-29 MED ORDER — HYDROCODONE-ACETAMINOPHEN 5-325 MG PO TABS: 1.0000 | ORAL_TABLET | Freq: Four times a day (QID) | ORAL | 0 refills | Status: DC | PRN

## 2020-09-29 NOTE — Progress Notes (Signed)
   Subjective: 4 Days Post-Op Procedure(s) (LRB): TOTAL KNEE ARTHROPLASTY (Left) Patient reports pain as mild.    Patient is well, and has had no acute complaints or problems.  No syncopal events.  He has also been able to void and empty his bladder independently. Objective: Vital signs in last 24 hours: Temp:  [97.7 F (36.5 C)-98.9 F (37.2 C)] 98.6 F (37 C) (05/14 0509) Pulse Rate:  [60-106] 66 (05/14 0838) Resp:  [18-20] 18 (05/14 0509) BP: (125-139)/(72-85) 139/76 (05/14 0838) SpO2:  [96 %-99 %] 98 % (05/14 0509)  Intake/Output from previous day:  Intake/Output Summary (Last 24 hours) at 09/29/2020 0955 Last data filed at 09/29/2020 0500 Gross per 24 hour  Intake 2555.38 ml  Output 2075 ml  Net 480.38 ml     Intake/Output this shift: No intake/output data recorded.  Labs: Recent Labs    09/27/20 0305  HGB 12.2*   Recent Labs    09/27/20 0305  WBC 11.1*  RBC 4.02*  HCT 37.4*  PLT 220   Recent Labs    09/29/20 0513  CREATININE 0.61   No results for input(s): LABPT, INR in the last 72 hours.  Exam: General - Patient is Alert and Oriented Extremity - Neurologically intact Sensation intact distally Intact pulses distally Dorsiflexion/Plantar flexion intact Dressing - dressing C/D/I Motor Function - intact, moving foot and toes well on exam.   Past Medical History:  Diagnosis Date  . A-fib ([place])   . Anxiety   . Arthritis   . Cancer ([place])   . GERD (gastroesophageal reflux disease)   . Hypertension     Assessment/Plan: 4 Days Post-Op Procedure(s) (LRB): TOTAL KNEE ARTHROPLASTY (Left) Active Problems:   S/P total knee arthroplasty, left  Estimated body mass index is 32.48 kg/m as calculated from the following:   Height as of this encounter: 5\' 8"  (1.727 m).   Weight as of this encounter: 96.9 kg. Advance diet Up with therapy   - Age 70 and older with one or more of the following:  - Obesity  - Expected need for hospital services (PT,  OT, Nursing) required for safe  discharge  - Anticipated need for postoperative skilled nursing care or inpatient rehab  - Active co-morbidities: , OR   - Unanticipated findings during/Post Surgery: None  - Patient is a high risk of re-admission due to: None    DVT Prophylaxis - Aspirin Weight bearing as tolerated.  -Plan to discharge home today.  He has 24-hour assistance there.  This will need to be done after 5 PM as his ride cannot get here before that time.  -Follow-up with Dr. Alvan Dame postoperatively.  Nicholes Stairs  Orthopedic Surgery  09/29/2020, 9:55 AM

## 2020-09-29 NOTE — Progress Notes (Signed)
Patient given discharge, follow up, and medication instructions, verbalized understanding, IV removed, personal belongings with patient, family to transport home  

## 2020-09-29 NOTE — Plan of Care (Signed)
  Problem: Education: Goal: Knowledge of General Education information will improve Description: Including pain rating scale, medication(s)/side effects and non-pharmacologic comfort measures Outcome: Adequate for Discharge   Problem: Health Behavior/Discharge Planning: Goal: Ability to manage health-related needs will improve Outcome: Adequate for Discharge   Problem: Clinical Measurements: Goal: Ability to maintain clinical measurements within normal limits will improve Outcome: Adequate for Discharge Goal: Will remain free from infection Outcome: Adequate for Discharge Goal: Diagnostic test results will improve Outcome: Adequate for Discharge Goal: Respiratory complications will improve Outcome: Adequate for Discharge Goal: Cardiovascular complication will be avoided Outcome: Adequate for Discharge   Problem: Activity: Goal: Risk for activity intolerance will decrease Outcome: Adequate for Discharge   Problem: Nutrition: Goal: Adequate nutrition will be maintained Outcome: Adequate for Discharge   Problem: Coping: Goal: Level of anxiety will decrease Outcome: Adequate for Discharge   Problem: Elimination: Goal: Will not experience complications related to bowel motility Outcome: Adequate for Discharge Goal: Will not experience complications related to urinary retention Outcome: Adequate for Discharge   Problem: Pain Managment: Goal: General experience of comfort will improve Outcome: Adequate for Discharge   Problem: Safety: Goal: Ability to remain free from injury will improve Outcome: Adequate for Discharge   Problem: Education: Goal: Knowledge of the prescribed therapeutic regimen will improve Outcome: Adequate for Discharge Goal: Individualized Educational Video(s) Outcome: Adequate for Discharge   Problem: Activity: Goal: Ability to avoid complications of mobility impairment will improve Outcome: Adequate for Discharge Goal: Range of joint motion will  improve Outcome: Adequate for Discharge   Problem: Clinical Measurements: Goal: Postoperative complications will be avoided or minimized Outcome: Adequate for Discharge   Problem: Pain Management: Goal: Pain level will decrease with appropriate interventions Outcome: Adequate for Discharge   Problem: Skin Integrity: Goal: Will show signs of wound healing Outcome: Adequate for Discharge

## 2020-10-01 DIAGNOSIS — Z471 Aftercare following joint replacement surgery: Secondary | ICD-10-CM | POA: Diagnosis not present

## 2020-10-01 DIAGNOSIS — M6281 Muscle weakness (generalized): Secondary | ICD-10-CM | POA: Diagnosis not present

## 2020-10-03 DIAGNOSIS — Z471 Aftercare following joint replacement surgery: Secondary | ICD-10-CM | POA: Diagnosis not present

## 2020-10-03 DIAGNOSIS — M6281 Muscle weakness (generalized): Secondary | ICD-10-CM | POA: Diagnosis not present

## 2020-10-04 DIAGNOSIS — Z471 Aftercare following joint replacement surgery: Secondary | ICD-10-CM | POA: Diagnosis not present

## 2020-10-04 DIAGNOSIS — M6281 Muscle weakness (generalized): Secondary | ICD-10-CM | POA: Diagnosis not present

## 2020-10-08 DIAGNOSIS — Z471 Aftercare following joint replacement surgery: Secondary | ICD-10-CM | POA: Diagnosis not present

## 2020-10-08 DIAGNOSIS — M6281 Muscle weakness (generalized): Secondary | ICD-10-CM | POA: Diagnosis not present

## 2020-10-10 DIAGNOSIS — M6281 Muscle weakness (generalized): Secondary | ICD-10-CM | POA: Diagnosis not present

## 2020-10-10 DIAGNOSIS — Z471 Aftercare following joint replacement surgery: Secondary | ICD-10-CM | POA: Diagnosis not present

## 2020-10-12 DIAGNOSIS — Z471 Aftercare following joint replacement surgery: Secondary | ICD-10-CM | POA: Diagnosis not present

## 2020-10-12 DIAGNOSIS — M6281 Muscle weakness (generalized): Secondary | ICD-10-CM | POA: Diagnosis not present

## 2020-10-12 NOTE — Discharge Summary (Signed)
Physician Discharge Summary   Patient ID: Philip Mcneil MRN: [id number] DOB/AGE: 1950/10/01 70 y.o.  Admit date: 09/25/2020 Discharge date: 09/29/2020  Primary Diagnosis:  Left knee osteoarthritis.   Admission Diagnoses:  Past Medical History:  Diagnosis Date  . A-fib ([place])   . Anxiety   . Arthritis   . Cancer ([place])   . GERD (gastroesophageal reflux disease)   . Hypertension    Discharge Diagnoses:   Active Problems:   S/P total knee arthroplasty, left  Estimated body mass index is 32.48 kg/m as calculated from the following:   Height as of this encounter: 5\' 8"  (1.727 m).   Weight as of this encounter: 96.9 kg.  Procedure:  Procedure(s) (LRB): TOTAL KNEE ARTHROPLASTY (Left)   Consults: None  HPI:  Philip Mcneil is a 70 y.o. male patient of   mine.  The patient had been seen, evaluated, and treated for months conservatively in the   office with medication, activity modification, and injections.  The patient had   radiographic changes of bone-on-bone arthritis with endplate sclerosis and osteophytes noted.  Based on the radiographic changes and failed conservative measures, the patient   decided to proceed with definitive treatment, total knee replacement.  Risks of infection, DVT, component failure, need for revision surgery, neurovascular injury were reviewed in the office setting.  The postop course was reviewed stressing the efforts to maximize post-operative satisfaction and function.  Consent was obtained for benefit of pain   relief.   Laboratory Data: Admission on 09/25/2020, Discharged on 09/29/2020  Component Date Value Ref Range Status  . ABO/RH(D) 09/25/2020    Final                   Value:A POS Performed at [facility], [place] [address], [place], [place] [address]   . WBC 09/26/2020 13.4* 4.0 - 10.5 K/uL Final  . RBC 09/26/2020 4.02* 4.22 - 5.81 MIL/uL Final  . Hemoglobin 09/26/2020 12.4* 13.0 - 17.0 g/dL Final  . HCT 09/26/2020 37.4*  39.0 - 52.0 % Final  . MCV 09/26/2020 93.0  80.0 - 100.0 fL Final  . MCH 09/26/2020 30.8  26.0 - 34.0 pg Final  . MCHC 09/26/2020 33.2  30.0 - 36.0 g/dL Final  . RDW 09/26/2020 13.2  11.5 - 15.5 % Final  . Platelets 09/26/2020 237  150 - 400 K/uL Final  . nRBC 09/26/2020 0.0  0.0 - 0.2 % Final   Performed at [facility], [place] [address], [place], [place] [address]  . Sodium 09/26/2020 136  135 - 145 mmol/L Final  . Potassium 09/26/2020 4.0  3.5 - 5.1 mmol/L Final  . Chloride 09/26/2020 107  98 - 111 mmol/L Final  . CO2 09/26/2020 24  22 - 32 mmol/L Final  . Glucose, Bld 09/26/2020 136* 70 - 99 mg/dL Final   Glucose reference range applies only to samples taken after fasting for at least 8 hours.  . BUN 09/26/2020 10  8 - 23 mg/dL Final  . Creatinine, Ser 09/26/2020 0.76  0.61 - 1.24 mg/dL Final  . Calcium 09/26/2020 8.8* 8.9 - 10.3 mg/dL Final  . GFR, Estimated 09/26/2020 >60  >60 mL/min Final   Comment: (NOTE) Calculated using the CKD-EPI Creatinine Equation (2021)   . Anion gap 09/26/2020 5  5 - 15 Final   Performed at [facility], [place] [address] [place], [place] [address]  . Glucose-Capillary 09/26/2020 126* 70 - 99 mg/dL Final  Glucose reference range applies only to samples taken after fasting for at least 8 hours.  . WBC 09/27/2020 11.1* 4.0 - 10.5 K/uL Final  . RBC 09/27/2020 4.02* 4.22 - 5.81 MIL/uL Final  . Hemoglobin 09/27/2020 12.2* 13.0 - 17.0 g/dL Final  . HCT 09/27/2020 37.4* 39.0 - 52.0 % Final  . MCV 09/27/2020 93.0  80.0 - 100.0 fL Final  . MCH 09/27/2020 30.3  26.0 - 34.0 pg Final  . MCHC 09/27/2020 32.6  30.0 - 36.0 g/dL Final  . RDW 09/27/2020 13.5  11.5 - 15.5 % Final  . Platelets 09/27/2020 220  150 - 400 K/uL Final  . nRBC 09/27/2020 0.0  0.0 - 0.2 % Final   Performed at [facility], [place] [address], [place], [place] [address]  . Creatinine, Ser 09/29/2020 0.61  0.61 - 1.24 mg/dL Final  . GFR,  Estimated 09/29/2020 >60  >60 mL/min Final   Comment: (NOTE) Calculated using the CKD-EPI Creatinine Equation (2021) Performed at [facility], [place] [address], [place], [place] [address]   Hospital Outpatient Visit on 09/21/2020  Component Date Value Ref Range Status  . SARS Coronavirus 2 09/21/2020 NEGATIVE  NEGATIVE Final   Comment: (NOTE) SARS-CoV-2 target nucleic acids are NOT DETECTED.  The SARS-CoV-2 RNA is generally detectable in upper and lower respiratory specimens during the acute phase of infection. Negative results do not preclude SARS-CoV-2 infection, do not rule out co-infections with other pathogens, and should not be used as the sole basis for treatment or other patient management decisions. Negative results must be combined with clinical observations, patient history, and epidemiological information. The expected result is Negative.  Fact Sheet for Patients: SugarRoll.be  Fact Sheet for Healthcare Providers: https://www.[employer]-mathews.com/  This test is not yet approved or cleared by the [place] FDA and  has been authorized for detection and/or diagnosis of SARS-CoV-2 by FDA under an Emergency Use Authorization (EUA). This EUA will remain  in effect (meaning this test can be used) for the duration of the COVID-19 declaration under Se                          ction 564(b)(1) of the Act, 21 U.S.C. section 360bbb-3(b)(1), unless the authorization is terminated or revoked sooner.  Performed at [facility], [place] [address], [place], [place] [address]   Hospital Outpatient Visit on 09/21/2020  Component Date Value Ref Range Status  . MRSA, PCR 09/21/2020 NEGATIVE  NEGATIVE Final  . Staphylococcus aureus 09/21/2020 NEGATIVE  NEGATIVE Final   Comment: (NOTE) The Xpert SA Assay (FDA approved for NASAL specimens in patients 4 years of age and older), is one component of a  comprehensive surveillance program. It is not intended to diagnose infection nor to guide or monitor treatment. Performed at [facility], [place] [address] [place], [place] [address]   . WBC 09/21/2020 4.8  4.0 - 10.5 K/uL Final  . RBC 09/21/2020 5.18  4.22 - 5.81 MIL/uL Final  . Hemoglobin 09/21/2020 15.6  13.0 - 17.0 g/dL Final  . HCT 09/21/2020 47.3  39.0 - 52.0 % Final  . MCV 09/21/2020 91.3  80.0 - 100.0 fL Final  . MCH 09/21/2020 30.1  26.0 - 34.0 pg Final  . MCHC 09/21/2020 33.0  30.0 - 36.0 g/dL Final  . RDW 09/21/2020 13.6  11.5 - 15.5 % Final  . Platelets 09/21/2020 281  150 - 400 K/uL Final  .  nRBC 09/21/2020 0.0  0.0 - 0.2 % Final   Performed at [place] [address], [place], [place] [address]  . Sodium 09/21/2020 137  135 - 145 mmol/L Final  . Potassium 09/21/2020 3.8  3.5 - 5.1 mmol/L Final  . Chloride 09/21/2020 104  98 - 111 mmol/L Final  . CO2 09/21/2020 22  22 - 32 mmol/L Final  . Glucose, Bld 09/21/2020 93  70 - 99 mg/dL Final   Glucose reference range applies only to samples taken after fasting for at least 8 hours.  . BUN 09/21/2020 7* 8 - 23 mg/dL Final  . Creatinine, Ser 09/21/2020 1.00  0.61 - 1.24 mg/dL Final  . Calcium 09/21/2020 9.6  8.9 - 10.3 mg/dL Final  . Total Protein 09/21/2020 7.5  6.5 - 8.1 g/dL Final  . Albumin 09/21/2020 4.3  3.5 - 5.0 g/dL Final  . AST 09/21/2020 17  15 - 41 U/L Final  . ALT 09/21/2020 16  0 - 44 U/L Final  . Alkaline Phosphatase 09/21/2020 74  38 - 126 U/L Final  . Total Bilirubin 09/21/2020 0.8  0.3 - 1.2 mg/dL Final  . GFR, Estimated 09/21/2020 >60  >60 mL/min Final   Comment: (NOTE) Calculated using the CKD-EPI Creatinine Equation (2021)   . Anion gap 09/21/2020 11  5 - 15 Final   Performed at [facility], [place] [address] [place], [place] [address]  . Prothrombin Time 09/21/2020 13.1  11.4 - 15.2 seconds Final  . INR 09/21/2020 1.0  0.8 - 1.2 Final    Comment: (NOTE) INR goal varies based on device and disease states. Performed at [facility], [place] [address], [place], [place] [address]   . aPTT 09/21/2020 30  24 - 36 seconds Final   Performed at [facility], [place] [address], [place], [place] [address]  . ABO/RH(D) 09/21/2020 A POS   Final  . Antibody Screen 09/21/2020 NEG   Final  . Sample Expiration 09/21/2020 09/28/2020,2359   Final  . Extend sample reason 09/21/2020    Final                   Value:NO TRANSFUSIONS OR PREGNANCY IN THE PAST 3 MONTHS Performed at [place] [address] [place], [place] [address]      X-Rays:No results found.  EKG: Orders placed or performed during the hospital encounter of 09/25/20  . EKG 12-Lead  . EKG 12-Lead     Hospital Course: Philip Mcneil is a 70 y.o. who was admitted to [facility]. They were brought to the operating room on 09/25/2020 and underwent Procedure(s): TOTAL KNEE ARTHROPLASTY.  Patient tolerated the procedure well and was later transferred to the recovery room and then to the orthopaedic floor for postoperative care. They were given PO and IV analgesics for pain control following their surgery. They were given 24 hours of postoperative antibiotics of  Anti-infectives (From admission, onward)   Start     Dose/Rate Route Frequency Ordered Stop   09/25/20 1600  ceFAZolin (ANCEF) IVPB 2g/100 mL premix        2 g 200 mL/hr over 30 Minutes Intravenous Every 6 hours 09/25/20 1336 09/25/20 2243   09/25/20 0730  ceFAZolin (ANCEF) IVPB 2g/100 mL premix        2 g 200 mL/hr over 30 Minutes Intravenous On call to O.R. 09/25/20 2841 09/25/20 1040     and started on DVT  prophylaxis in the form of Aspirin.   PT and OT were ordered for total joint protocol. Discharge planning consulted to help with postop disposition and equipment needs. Patient had a good night on the evening of surgery. They started to get up OOB with  therapy on POD #0 and ambulated 40 feet without difficulty. During his PT session on POD #1, he became lightheaded and had an episode of syncope with hypotension. Rapid response was called and EKG was performed with normal result. He quickly became alert and oriented. Later that evening, after the foley catheter was removed he was unable to void and 51F foley was inserted. Patient had a second episode of pre-syncope, and was transferred to telemetry for monitoring. On POD #2, patient was seen in rounds and was doing well. Episodes were likely related to vasovagal response to pain. On POD #3, patient was doing well. He arranged for his brother to stay with him at home full time. We attempted a voiding trial with success.Pt was seen during rounds on day four and was ready to go home pending progress with therapy. Pt worked with therapy for one additional session and was meeting their goals. He was discharged to home later that day in stable condition.  Diet: Regular diet Activity: WBAT Follow-up: in 2 weeks Disposition: Home Discharged Condition: good   Discharge Instructions    Call MD / Call 911   Complete by: As directed    If you experience chest pain or shortness of breath, CALL 911 and be transported to the hospital emergency room.  If you develope a fever above 101 F, pus (white drainage) or increased drainage or redness at the wound, or calf pain, call your surgeon's office.   Call MD / Call 911   Complete by: As directed    If you experience chest pain or shortness of breath, CALL 911 and be transported to the hospital emergency room.  If you develope a fever above 101 F, pus (white drainage) or increased drainage or redness at the wound, or calf pain, call your surgeon's office.   Change dressing   Complete by: As directed    Maintain surgical dressing until follow up in the clinic. If the edges start to pull up, may reinforce with tape. If the dressing is no longer working, may remove and  cover with gauze and tape, but must keep the area dry and clean.  Call with any questions or concerns.   Constipation Prevention   Complete by: As directed    Drink plenty of fluids.  Prune juice may be helpful.  You may use a stool softener, such as Colace (over the counter) 100 mg twice a day.  Use MiraLax (over the counter) for constipation as needed.   Constipation Prevention   Complete by: As directed    Drink plenty of fluids.  Prune juice may be helpful.  You may use a stool softener, such as Colace (over the counter) 100 mg twice a day.  Use MiraLax (over the counter) for constipation as needed.   Diet - low sodium heart healthy   Complete by: As directed    Diet - low sodium heart healthy   Complete by: As directed    Increase activity slowly as tolerated   Complete by: As directed    Weight bearing as tolerated with assist device (walker, cane, etc) as directed, use it as long as suggested by your surgeon or therapist, typically at least 4-6 weeks.   Increase  activity slowly as tolerated   Complete by: As directed    Post-operative opioid taper instructions:   Complete by: As directed    POST-OPERATIVE OPIOID TAPER INSTRUCTIONS: It is important to wean off of your opioid medication as soon as possible. If you do not need pain medication after your surgery it is ok to stop day one. Opioids include: Codeine, Hydrocodone(Norco, Vicodin), Oxycodone(Percocet, oxycontin) and hydromorphone amongst others.  Long term and even short term use of opiods can cause: Increased pain response Dependence Constipation Depression Respiratory depression And more.  Withdrawal symptoms can include Flu like symptoms Nausea, vomiting And more Techniques to manage these symptoms Hydrate well Eat regular healthy meals Stay active Use relaxation techniques(deep breathing, meditating, yoga) Do Not substitute Alcohol to help with tapering If you have been on opioids for less than two weeks and do  not have pain than it is ok to stop all together.  Plan to wean off of opioids This plan should start within one week post op of your joint replacement. Maintain the same interval or time between taking each dose and first decrease the dose.  Cut the total daily intake of opioids by one tablet each day Next start to increase the time between doses. The last dose that should be eliminated is the evening dose.      Post-operative opioid taper instructions:   Complete by: As directed    POST-OPERATIVE OPIOID TAPER INSTRUCTIONS: It is important to wean off of your opioid medication as soon as possible. If you do not need pain medication after your surgery it is ok to stop day one. Opioids include: Codeine, Hydrocodone(Norco, Vicodin), Oxycodone(Percocet, oxycontin) and hydromorphone amongst others.  Long term and even short term use of opiods can cause: Increased pain response Dependence Constipation Depression Respiratory depression And more.  Withdrawal symptoms can include Flu like symptoms Nausea, vomiting And more Techniques to manage these symptoms Hydrate well Eat regular healthy meals Stay active Use relaxation techniques(deep breathing, meditating, yoga) Do Not substitute Alcohol to help with tapering If you have been on opioids for less than two weeks and do not have pain than it is ok to stop all together.  Plan to wean off of opioids This plan should start within one week post op of your joint replacement. Maintain the same interval or time between taking each dose and first decrease the dose.  Cut the total daily intake of opioids by one tablet each day Next start to increase the time between doses. The last dose that should be eliminated is the evening dose.      TED hose   Complete by: As directed    Use stockings (TED hose) for 2 weeks on both leg(s).  You may remove them at night for sleeping.     Allergies as of 09/29/2020      Reactions   Morphine And  Related Anaphylaxis   Percocet [oxycodone-acetaminophen]    Itchy/jumpy       Medication List    STOP taking these medications   ibuprofen 200 MG tablet Commonly known as: ADVIL   OVER THE COUNTER MEDICATION     TAKE these medications   ALPRAZolam 0.5 MG tablet Commonly known as: XANAX Take 0.5 mg by mouth 3 (three) times daily as needed for anxiety.   aspirin 81 MG chewable tablet Chew 1 tablet (81 mg total) by mouth 2 (two) times daily for 28 days. Notes to patient: 09/29/20   docusate sodium 100 MG capsule Commonly  known as: COLACE Take 1 capsule (100 mg total) by mouth 2 (two) times daily. Notes to patient: 09/29/20   HYDROcodone-acetaminophen 5-325 MG tablet Commonly known as: NORCO/VICODIN Take 1-2 tablets by mouth every 6 (six) hours as needed for severe pain.   losartan 25 MG tablet Commonly known as: COZAAR Take 25 mg by mouth daily. Notes to patient: 09/30/20   Loteprednol Etabonate 0.5 % Gel Place 1 drop into the right eye daily. Notes to patient: 09/30/20   methocarbamol 500 MG tablet Commonly known as: ROBAXIN Take 1 tablet (500 mg total) by mouth every 6 (six) hours as needed for muscle spasms.   pantoprazole 40 MG tablet Commonly known as: PROTONIX Take 40 mg by mouth daily. Notes to patient: 09/30/20   polyethylene glycol 17 g packet Commonly known as: MIRALAX / GLYCOLAX Take 17 g by mouth daily as needed for mild constipation.   Prolensa 0.07 % Soln Generic drug: Bromfenac Sodium Place 1 drop into the right eye at bedtime. Notes to patient: 09/29/20            Discharge Care Instructions  (From admission, onward)         Start     Ordered   09/26/20 0000  Change dressing       Comments: Maintain surgical dressing until follow up in the clinic. If the edges start to pull up, may reinforce with tape. If the dressing is no longer working, may remove and cover with gauze and tape, but must keep the area dry and clean.  Call with any  questions or concerns.   09/26/20 0947          Follow-up Information    Paralee Cancel, MD. Go on 10/11/2020.   Specialty: Orthopedic Surgery Why: You are scheduled for first post op appointment on Thursday May 26th at 3:00pm. Contact information: [address] [place] [address] [place] [address] [phone]               Signed: Griffith Citron, PA-C Orthopedic Surgery 10/12/2020, 8:03 AM

## 2020-10-16 DIAGNOSIS — M6281 Muscle weakness (generalized): Secondary | ICD-10-CM | POA: Diagnosis not present

## 2020-10-16 DIAGNOSIS — Z471 Aftercare following joint replacement surgery: Secondary | ICD-10-CM | POA: Diagnosis not present

## 2020-10-18 DIAGNOSIS — Z471 Aftercare following joint replacement surgery: Secondary | ICD-10-CM | POA: Diagnosis not present

## 2020-10-18 DIAGNOSIS — M6281 Muscle weakness (generalized): Secondary | ICD-10-CM | POA: Diagnosis not present

## 2020-10-22 DIAGNOSIS — M6281 Muscle weakness (generalized): Secondary | ICD-10-CM | POA: Diagnosis not present

## 2020-10-22 DIAGNOSIS — Z471 Aftercare following joint replacement surgery: Secondary | ICD-10-CM | POA: Diagnosis not present

## 2020-10-24 DIAGNOSIS — Z471 Aftercare following joint replacement surgery: Secondary | ICD-10-CM | POA: Diagnosis not present

## 2020-10-24 DIAGNOSIS — M6281 Muscle weakness (generalized): Secondary | ICD-10-CM | POA: Diagnosis not present

## 2020-10-29 DIAGNOSIS — Z471 Aftercare following joint replacement surgery: Secondary | ICD-10-CM | POA: Diagnosis not present

## 2020-10-29 DIAGNOSIS — M6281 Muscle weakness (generalized): Secondary | ICD-10-CM | POA: Diagnosis not present

## 2020-10-31 DIAGNOSIS — Z471 Aftercare following joint replacement surgery: Secondary | ICD-10-CM | POA: Diagnosis not present

## 2020-10-31 DIAGNOSIS — M6281 Muscle weakness (generalized): Secondary | ICD-10-CM | POA: Diagnosis not present

## 2020-11-05 DIAGNOSIS — Z471 Aftercare following joint replacement surgery: Secondary | ICD-10-CM | POA: Diagnosis not present

## 2020-11-05 DIAGNOSIS — M6281 Muscle weakness (generalized): Secondary | ICD-10-CM | POA: Diagnosis not present

## 2020-11-12 DIAGNOSIS — Z471 Aftercare following joint replacement surgery: Secondary | ICD-10-CM | POA: Diagnosis not present

## 2020-11-12 DIAGNOSIS — M6281 Muscle weakness (generalized): Secondary | ICD-10-CM | POA: Diagnosis not present

## 2020-11-14 DIAGNOSIS — M6281 Muscle weakness (generalized): Secondary | ICD-10-CM | POA: Diagnosis not present

## 2020-11-14 DIAGNOSIS — Z471 Aftercare following joint replacement surgery: Secondary | ICD-10-CM | POA: Diagnosis not present

## 2020-11-14 DIAGNOSIS — Z96652 Presence of left artificial knee joint: Secondary | ICD-10-CM | POA: Diagnosis not present

## 2020-11-20 DIAGNOSIS — Z85828 Personal history of other malignant neoplasm of skin: Secondary | ICD-10-CM | POA: Diagnosis not present

## 2020-11-20 DIAGNOSIS — L570 Actinic keratosis: Secondary | ICD-10-CM | POA: Diagnosis not present

## 2020-11-20 DIAGNOSIS — Z1283 Encounter for screening for malignant neoplasm of skin: Secondary | ICD-10-CM | POA: Diagnosis not present

## 2020-11-20 DIAGNOSIS — L814 Other melanin hyperpigmentation: Secondary | ICD-10-CM | POA: Diagnosis not present

## 2020-11-21 DIAGNOSIS — Z471 Aftercare following joint replacement surgery: Secondary | ICD-10-CM | POA: Diagnosis not present

## 2020-11-21 DIAGNOSIS — M6281 Muscle weakness (generalized): Secondary | ICD-10-CM | POA: Diagnosis not present

## 2020-11-23 DIAGNOSIS — M6281 Muscle weakness (generalized): Secondary | ICD-10-CM | POA: Diagnosis not present

## 2020-11-23 DIAGNOSIS — Z471 Aftercare following joint replacement surgery: Secondary | ICD-10-CM | POA: Diagnosis not present

## 2020-11-28 ENCOUNTER — Other Ambulatory Visit: Payer: Self-pay

## 2020-11-28 ENCOUNTER — Encounter (INDEPENDENT_AMBULATORY_CARE_PROVIDER_SITE_OTHER): Payer: Medicare Other | Admitting: Ophthalmology

## 2020-11-28 DIAGNOSIS — H43813 Vitreous degeneration, bilateral: Secondary | ICD-10-CM

## 2020-11-28 DIAGNOSIS — H59031 Cystoid macular edema following cataract surgery, right eye: Secondary | ICD-10-CM | POA: Diagnosis not present

## 2020-11-28 DIAGNOSIS — H35033 Hypertensive retinopathy, bilateral: Secondary | ICD-10-CM | POA: Diagnosis not present

## 2020-11-28 DIAGNOSIS — I10 Essential (primary) hypertension: Secondary | ICD-10-CM

## 2021-02-20 DIAGNOSIS — I10 Essential (primary) hypertension: Secondary | ICD-10-CM | POA: Diagnosis not present

## 2021-02-20 DIAGNOSIS — R5383 Other fatigue: Secondary | ICD-10-CM | POA: Diagnosis not present

## 2021-02-20 DIAGNOSIS — K2100 Gastro-esophageal reflux disease with esophagitis, without bleeding: Secondary | ICD-10-CM | POA: Diagnosis not present

## 2021-02-20 DIAGNOSIS — E782 Mixed hyperlipidemia: Secondary | ICD-10-CM | POA: Diagnosis not present

## 2021-02-20 DIAGNOSIS — E7849 Other hyperlipidemia: Secondary | ICD-10-CM | POA: Diagnosis not present

## 2021-02-22 DIAGNOSIS — F4321 Adjustment disorder with depressed mood: Secondary | ICD-10-CM | POA: Diagnosis not present

## 2021-02-22 DIAGNOSIS — I10 Essential (primary) hypertension: Secondary | ICD-10-CM | POA: Diagnosis not present

## 2021-02-22 DIAGNOSIS — R7301 Impaired fasting glucose: Secondary | ICD-10-CM | POA: Diagnosis not present

## 2021-02-22 DIAGNOSIS — Z23 Encounter for immunization: Secondary | ICD-10-CM | POA: Diagnosis not present

## 2021-02-22 DIAGNOSIS — E7849 Other hyperlipidemia: Secondary | ICD-10-CM | POA: Diagnosis not present

## 2021-02-22 DIAGNOSIS — Z1389 Encounter for screening for other disorder: Secondary | ICD-10-CM | POA: Diagnosis not present

## 2021-02-22 DIAGNOSIS — Z1331 Encounter for screening for depression: Secondary | ICD-10-CM | POA: Diagnosis not present

## 2021-02-22 DIAGNOSIS — F411 Generalized anxiety disorder: Secondary | ICD-10-CM | POA: Diagnosis not present

## 2021-04-11 ENCOUNTER — Encounter (HOSPITAL_COMMUNITY): Payer: Self-pay

## 2021-04-11 ENCOUNTER — Emergency Department (HOSPITAL_COMMUNITY): Payer: Medicare Other

## 2021-04-11 ENCOUNTER — Emergency Department (HOSPITAL_COMMUNITY)
Admission: EM | Admit: 2021-04-11 | Discharge: 2021-04-11 | Disposition: A | Discharge status: Home / Self Care | Payer: Medicare Other | Attending: Emergency Medicine | Admitting: Emergency Medicine

## 2021-04-11 ENCOUNTER — Other Ambulatory Visit: Payer: Self-pay

## 2021-04-11 DIAGNOSIS — M47816 Spondylosis without myelopathy or radiculopathy, lumbar region: Secondary | ICD-10-CM | POA: Diagnosis not present

## 2021-04-11 DIAGNOSIS — Z96652 Presence of left artificial knee joint: Secondary | ICD-10-CM | POA: Insufficient documentation

## 2021-04-11 DIAGNOSIS — I700 Atherosclerosis of aorta: Secondary | ICD-10-CM | POA: Diagnosis not present

## 2021-04-11 DIAGNOSIS — Z85828 Personal history of other malignant neoplasm of skin: Secondary | ICD-10-CM | POA: Diagnosis not present

## 2021-04-11 DIAGNOSIS — R310 Gross hematuria: Secondary | ICD-10-CM | POA: Diagnosis not present

## 2021-04-11 DIAGNOSIS — Z87891 Personal history of nicotine dependence: Secondary | ICD-10-CM | POA: Insufficient documentation

## 2021-04-11 DIAGNOSIS — N281 Cyst of kidney, acquired: Secondary | ICD-10-CM | POA: Diagnosis not present

## 2021-04-11 DIAGNOSIS — Z79899 Other long term (current) drug therapy: Secondary | ICD-10-CM | POA: Diagnosis not present

## 2021-04-11 DIAGNOSIS — R319 Hematuria, unspecified: Secondary | ICD-10-CM | POA: Insufficient documentation

## 2021-04-11 DIAGNOSIS — I10 Essential (primary) hypertension: Secondary | ICD-10-CM | POA: Insufficient documentation

## 2021-04-11 LAB — COMPREHENSIVE METABOLIC PANEL
ALT: 15 U/L (ref 0–44)
AST: 17 U/L (ref 15–41)
Albumin: 4.1 g/dL (ref 3.5–5.0)
Alkaline Phosphatase: 76 U/L (ref 38–126)
Anion gap: 8 (ref 5–15)
BUN: 10 mg/dL (ref 8–23)
CO2: 25 mmol/L (ref 22–32)
Calcium: 9.1 mg/dL (ref 8.9–10.3)
Chloride: 102 mmol/L (ref 98–111)
Creatinine, Ser: 0.88 mg/dL (ref 0.61–1.24)
GFR, Estimated: >60 mL/min (ref >60)
Glucose, Bld: 102 mg/dL — ABNORMAL HIGH (ref 70–99)
Potassium: 3.6 mmol/L (ref 3.5–5.1)
Sodium: 135 mmol/L (ref 135–145)
Total Bilirubin: 0.7 mg/dL (ref 0.3–1.2)
Total Protein: 7.2 g/dL (ref 6.5–8.1)

## 2021-04-11 LAB — URINALYSIS, ROUTINE W REFLEX MICROSCOPIC
Bilirubin Urine: NEGATIVE
Glucose, UA: NEGATIVE mg/dL
Ketones, ur: NEGATIVE mg/dL
Leukocytes,Ua: NEGATIVE
Nitrite: NEGATIVE
Protein, ur: 100 mg/dL — AB
RBC / HPF: >50 RBC/hpf — ABNORMAL HIGH (ref 0–5)
Specific Gravity, Urine: 1.003 — ABNORMAL LOW (ref 1.005–1.030)
pH: 7 (ref 5.0–8.0)

## 2021-04-11 LAB — CBC WITH DIFFERENTIAL/PLATELET
Abs Immature Granulocytes: 0.01 10*3/uL (ref 0.00–0.07)
Basophils Absolute: 0 10*3/uL (ref 0.0–0.1)
Basophils Relative: 1 %
Eosinophils Absolute: 0.1 10*3/uL (ref 0.0–0.5)
Eosinophils Relative: 1 %
HCT: 44.6 % (ref 39.0–52.0)
Hemoglobin: 15.2 g/dL (ref 13.0–17.0)
Immature Granulocytes: 0 %
Lymphocytes Relative: 20 %
Lymphs Abs: 0.9 10*3/uL (ref 0.7–4.0)
MCH: 30.2 pg (ref 26.0–34.0)
MCHC: 34.1 g/dL (ref 30.0–36.0)
MCV: 88.7 fL (ref 80.0–100.0)
Monocytes Absolute: 0.4 10*3/uL (ref 0.1–1.0)
Monocytes Relative: 10 %
Neutro Abs: 2.9 10*3/uL (ref 1.7–7.7)
Neutrophils Relative %: 68 %
Platelets: 245 10*3/uL (ref 150–400)
RBC: 5.03 MIL/uL (ref 4.22–5.81)
RDW: 13.7 % (ref 11.5–15.5)
WBC: 4.3 10*3/uL (ref 4.0–10.5)
nRBC: 0 % (ref 0.0–0.2)

## 2021-04-11 LAB — MAGNESIUM: Magnesium: 2.1 mg/dL (ref 1.7–2.4)

## 2021-04-11 MED ORDER — IOHEXOL 350 MG/ML SOLN
80.0000 mL | Freq: Once | INTRAVENOUS | Status: AC | PRN
  Administered 2021-04-11: 80 mL via INTRAVENOUS

## 2021-04-11 NOTE — ED Triage Notes (Signed)
Pt c/o Hematuria since beginning of year. Hx of Prostate CA and treated. Pt has been seen in past for same.

## 2021-04-11 NOTE — ED Provider Notes (Addendum)
[facility] DEPT Provider Note   CSN: [id number] Arrival date & time: 04/11/21  2703     History Chief Complaint  Patient presents with   Hematuria    Philip Mcneil is a 70 y.o. male.   Hematuria Pertinent negatives include no chest pain, no abdominal pain, no headaches and no shortness of breath. 70 year old male presenting for hematuria.  He has remote history of prostate cancer, s/p radiation and prostatectomy.  This was in the year 2000.  Earlier this year, he did have an episode of gross hematuria.  This resolved on its own.  He was last seen by urology in March and, at that time, underwent a CT scan and cystoscopy.  In May, he underwent orthopedic surgery on his left knee.  During that surgery, he had a Foley catheter placed.  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  Foley catheter placement in May did cause traumatic hematuria.  This did resolve after several days.  Patient has not had hematuria since then, up until 2 days ago.  2 days ago, he developed his current symptoms of hematuria.  His urine has been dark red in color.  This is worsened at night.  He is concerned because, in the past, he has had difficulty urinating when he develops hematuria, secondary to obstructive clots.  Over the past 2 days, he feels that he has had to strain in order to urinate.  He denies any associated pain.  He has not had any other associated symptoms.     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    Past Surgical History:  Procedure Laterality Date   BACK SURGERY     x2   CATARACT EXTRACTION Bilateral    CERVICAL LAMINECTOMY     INSERTION OF MESH N/A 11/23/2013   Procedure: INSERTION OF MESH;  Surgeon:  Jamesetta So, MD;  Location: [facility];  Service: General;  Laterality: N/A;   KNEE ARTHROSCOPY Left    ORIF FOOT FRACTURE Right    PROSTATECTOMY     [place]   ROTATOR CUFF REPAIR Right    THUMB AMPUTATION Left    TOTAL KNEE ARTHROPLASTY Left 09/25/2020   Procedure: TOTAL KNEE ARTHROPLASTY;  Surgeon: Paralee Cancel, MD;  Location: [facility];  Service: Orthopedics;  Laterality: Left;  70 mins   tumor retina     TYMPANOPLASTY Left 02/22/2018   Procedure: TYMPANOPLASTY;  Surgeon: [name], MD;  Location: [place];  Service: ENT;  Laterality: Left;   UMBILICAL HERNIA REPAIR N/A 11/23/2013   Procedure: UMBILICAL HERNIORRHAPHY;  Surgeon: Jamesetta So, MD;  Location: [facility];  Service: General;  Laterality: N/A;       Family History  Problem Relation Age of Onset   Alzheimer's disease Mother    Alzheimer's disease Father    Prostate cancer Father    Alzheimer's disease Paternal Grandfather    Allergic rhinitis Neg Hx    Asthma Neg Hx    Eczema Neg Hx    Immunodeficiency Neg Hx    Urticaria Neg Hx     Social History   Tobacco Use   Smoking status: Former  Packs/day: 0.25    Years: 20.00    Pack years: 5.00    Types: Cigarettes    Quit date: 11/18/2003    Years since quitting: 17.4   Smokeless tobacco: Never  Vaping Use   Vaping Use: Every day  Substance Use Topics   Alcohol use: No    Alcohol/week: 0.0 standard drinks   Drug use: No    Home Medications Prior to Admission medications   Medication Sig Start Date End Date Taking? Authorizing Provider  ALPRAZolam Duanne Moron) 0.5 MG tablet Take 0.5 mg by mouth 3 (three) times daily as needed for anxiety. 10/11/15   [provider]  atorvastatin (LIPITOR) 10 MG tablet Take 10 mg by mouth once a week. 02/22/21   [provider]  docusate sodium (COLACE) 100 MG capsule Take 1 capsule (100 mg total) by mouth 2 (two) times daily. 09/26/20   Irving Copas, PA-C  HYDROcodone-acetaminophen  (NORCO/VICODIN) 5-325 MG tablet Take 1-2 tablets by mouth every 6 (six) hours as needed for severe pain. 09/29/20   Irving Copas, PA-C  losartan (COZAAR) 25 MG tablet Take 25 mg by mouth daily.    [provider]  Loteprednol Etabonate 0.5 % GEL Place 1 drop into the right eye daily. 04/18/20   [provider]  methocarbamol (ROBAXIN) 500 MG tablet Take 1 tablet (500 mg total) by mouth every 6 (six) hours as needed for muscle spasms. 09/26/20   Irving Copas, PA-C  pantoprazole (PROTONIX) 40 MG tablet Take 40 mg by mouth daily.    [provider]  polyethylene glycol (MIRALAX / GLYCOLAX) 17 g packet Take 17 g by mouth daily as needed for mild constipation. 09/26/20   Irving Copas, PA-C  PROLENSA 0.07 % SOLN Place 1 drop into the right eye at bedtime. 04/18/20   [provider]    Allergies    Morphine and related and Percocet [oxycodone-acetaminophen]  Review of Systems   Review of Systems  Constitutional:  Negative for activity change, appetite change, chills, fatigue and fever.  HENT:  Negative for congestion, ear pain and sore throat.   Eyes:  Negative for pain and visual disturbance.  Respiratory:  Negative for cough, chest tightness and shortness of breath.   Cardiovascular:  Negative for chest pain and palpitations.  Gastrointestinal:  Negative for abdominal distention, abdominal pain, blood in stool, diarrhea, nausea and vomiting.  Genitourinary:  Positive for difficulty urinating and hematuria. Negative for decreased urine volume, dysuria, flank pain, penile discharge, penile pain and urgency.  Musculoskeletal:  Negative for arthralgias, back pain, myalgias and neck pain.  Skin:  Negative for color change and rash.  Neurological:  Negative for dizziness, seizures, syncope, weakness, light-headedness, numbness and headaches.  Hematological:  Does not bruise/bleed easily.  Psychiatric/Behavioral:  Negative for confusion and decreased  concentration.   All other systems reviewed and are negative.  Physical Exam Updated Vital Signs BP (!) 144/90   Pulse 76   Temp 97.9 F (36.6 C) (Oral)   Resp 15   SpO2 98%   Physical Exam Vitals and nursing note reviewed.  Constitutional:      General: He is not in acute distress.    Appearance: Normal appearance. He is well-developed and normal weight. He is not ill-appearing, toxic-appearing or diaphoretic.  HENT:     Head: Normocephalic and atraumatic.     Right Ear: External ear normal.     Left Ear: External ear normal.     Nose: Nose  normal.     Mouth/Throat:     Mouth: Mucous membranes are moist.     Pharynx: Oropharynx is clear.  Eyes:     General: No scleral icterus.    Extraocular Movements: Extraocular movements intact.     Conjunctiva/sclera: Conjunctivae normal.  Cardiovascular:     Rate and Rhythm: Normal rate and regular rhythm.     Heart sounds: No murmur heard. Pulmonary:     Effort: Pulmonary effort is normal. No respiratory distress.     Breath sounds: Normal breath sounds. No wheezing or rales.  Chest:     Chest wall: No tenderness.  Abdominal:     Palpations: Abdomen is soft.     Tenderness: There is no abdominal tenderness.  Musculoskeletal:        General: No swelling.     Cervical back: Normal range of motion and neck supple. No rigidity.     Right lower leg: No edema.     Left lower leg: No edema.  Skin:    General: Skin is warm and dry.     Capillary Refill: Capillary refill takes less than 2 seconds.     Coloration: Skin is not pale.  Neurological:     General: No focal deficit present.     Mental Status: He is alert and oriented to person, place, and time.     Cranial Nerves: No cranial nerve deficit.     Sensory: No sensory deficit.     Motor: No weakness.     Coordination: Coordination normal.  Psychiatric:        Mood and Affect: Mood normal.        Behavior: Behavior normal.        Thought Content: Thought content normal.         Judgment: Judgment normal.    ED Results / Procedures / Treatments   Labs (all labs ordered are listed, but only abnormal results are displayed) Labs Reviewed  URINALYSIS, ROUTINE W REFLEX MICROSCOPIC - Abnormal; Notable for the following components:      Result Value   Color, Urine RED (*)    Specific Gravity, Urine 1.003 (*)    Hgb urine dipstick MODERATE (*)    Protein, ur 100 (*)    RBC / HPF >50 (*)    Bacteria, UA RARE (*)    All other components within normal limits  COMPREHENSIVE METABOLIC PANEL - Abnormal; Notable for the following components:   Glucose, Bld 102 (*)    All other components within normal limits  CBC WITH DIFFERENTIAL/PLATELET  MAGNESIUM    EKG None  Radiology CT ABDOMEN PELVIS W CONTRAST  Result Date: 04/11/2021 CLINICAL DATA:  Let us hematuria EXAM: CT ABDOMEN AND PELVIS WITH CONTRAST TECHNIQUE: Multidetector CT imaging of the abdomen and pelvis was performed using the standard protocol following bolus administration of intravenous contrast. CONTRAST:  24mL OMNIPAQUE IOHEXOL 350 MG/ML SOLN COMPARISON:  CT abdomen and pelvis 07/06/2020 FINDINGS: Lower chest: No acute abnormality. Hepatobiliary: No focal liver abnormality is seen. No gallstones, gallbladder wall thickening, or biliary dilatation. Pancreas: Unremarkable. No pancreatic ductal dilatation or surrounding inflammatory changes. Spleen: Normal in size without focal abnormality. Adrenals/Urinary Tract: Mild thickening of the left adrenal gland similar to previous study. Right adrenal gland is normal. 1.5 cm exophytic cyst at the upper pole the right kidney. Kidneys appear otherwise normal. No nephrolithiasis or hydronephrosis visualized. Urinary bladder is within normal limits. Stomach/Bowel: Stomach is within normal limits. Appendix appears normal. No evidence of  bowel wall thickening, distention, or inflammatory changes. Vascular/Lymphatic: Aortic atherosclerosis. No enlarged abdominal or pelvic  lymph nodes. Reproductive: Prostatectomy surgical changes. Other: No abdominal wall hernia or abnormality. No abdominopelvic ascites. Musculoskeletal: No suspicious bony lesions identified. Advanced degenerative changes of the lumbar spine. Note is made of asymmetric fatty atrophy of the right gluteal muscles. IMPRESSION: 1. No acute process identified. 2. Prostatectomy changes. 3. Other chronic findings as described. Electronically Signed   By: Ofilia Neas M.D.   On: 04/11/2021 10:46    Procedures Procedures   Medications Ordered in ED Medications  iohexol (OMNIPAQUE) 350 MG/ML injection 80 mL (80 mLs Intravenous Contrast Given 04/11/21 1014)    ED Course  I have reviewed the triage vital signs and the nursing notes.  Pertinent labs & imaging results that were available during my care of the patient were reviewed by me and considered in my medical decision making (see chart for details).    MDM Rules/Calculators/A&P                          Patient with remote history of prostate cancer, s/p radiation and prostatectomy, presenting for 2 days of painless gross hematuria.  He is well-appearing on arrival.  Urine has the appearance of cranberry juice with presence of sediment.  Suspect lower urinary tract source.  Other than his hematuria, he denies any current symptoms at this time.  Urinalysis showed no evidence of infection.  Patient's hemoglobin is greater than baseline.  Laboratory work-up was reassuring.  CT scan showed no evidence of stone or other identifiable etiology of hematuria.  Bedside ultrasound was performed post voiding and patient had no evidence of urinary retention.  He will benefit from urology follow-up.  Contact information provided.  He was encouraged to return to the ED if he does experience any worsening symptoms or inability to void.  Patient was discharged in stable condition.  Final Clinical Impression(s) / ED Diagnoses Final diagnoses:  Hematuria, unspecified  type    Rx / DC Orders ED Discharge Orders     None        Godfrey Pick, MD 04/11/21 1229    Godfrey Pick, MD 04/11/21 1229

## 2021-04-18 DIAGNOSIS — Z8673 Personal history of transient ischemic attack (TIA), and cerebral infarction without residual deficits: Secondary | ICD-10-CM

## 2021-04-18 HISTORY — DX: Personal history of transient ischemic attack (TIA), and cerebral infarction without residual deficits: Z86.73

## 2021-04-25 ENCOUNTER — Telehealth: Payer: Self-pay

## 2021-04-25 NOTE — Telephone Encounter (Signed)
Patient called stating his groin is painful and swollen twice the size, he has chills, decrease urine output, and decrease appetite.  Patient advised to go to ER for evaluation. Patient voiced understanding.

## 2021-04-29 DIAGNOSIS — R509 Fever, unspecified: Secondary | ICD-10-CM | POA: Diagnosis not present

## 2021-04-29 DIAGNOSIS — R5383 Other fatigue: Secondary | ICD-10-CM | POA: Diagnosis not present

## 2021-04-29 DIAGNOSIS — R059 Cough, unspecified: Secondary | ICD-10-CM | POA: Diagnosis not present

## 2021-04-29 DIAGNOSIS — Z96652 Presence of left artificial knee joint: Secondary | ICD-10-CM | POA: Diagnosis not present

## 2021-04-29 DIAGNOSIS — R519 Headache, unspecified: Secondary | ICD-10-CM | POA: Diagnosis not present

## 2021-04-29 DIAGNOSIS — I951 Orthostatic hypotension: Secondary | ICD-10-CM | POA: Diagnosis not present

## 2021-04-29 DIAGNOSIS — Z20822 Contact with and (suspected) exposure to covid-19: Secondary | ICD-10-CM | POA: Diagnosis not present

## 2021-04-29 DIAGNOSIS — Z87891 Personal history of nicotine dependence: Secondary | ICD-10-CM | POA: Diagnosis not present

## 2021-04-29 DIAGNOSIS — J189 Pneumonia, unspecified organism: Secondary | ICD-10-CM | POA: Diagnosis not present

## 2021-04-29 DIAGNOSIS — J9811 Atelectasis: Secondary | ICD-10-CM | POA: Diagnosis not present

## 2021-04-29 DIAGNOSIS — D72829 Elevated white blood cell count, unspecified: Secondary | ICD-10-CM | POA: Diagnosis not present

## 2021-04-29 DIAGNOSIS — I6523 Occlusion and stenosis of bilateral carotid arteries: Secondary | ICD-10-CM | POA: Diagnosis not present

## 2021-04-29 DIAGNOSIS — E860 Dehydration: Secondary | ICD-10-CM | POA: Diagnosis not present

## 2021-04-29 DIAGNOSIS — I639 Cerebral infarction, unspecified: Secondary | ICD-10-CM | POA: Diagnosis not present

## 2021-04-29 DIAGNOSIS — Z923 Personal history of irradiation: Secondary | ICD-10-CM | POA: Diagnosis not present

## 2021-04-29 DIAGNOSIS — R531 Weakness: Secondary | ICD-10-CM | POA: Diagnosis not present

## 2021-04-29 DIAGNOSIS — R29818 Other symptoms and signs involving the nervous system: Secondary | ICD-10-CM | POA: Diagnosis not present

## 2021-04-29 DIAGNOSIS — Z602 Problems related to living alone: Secondary | ICD-10-CM | POA: Diagnosis not present

## 2021-04-29 DIAGNOSIS — R270 Ataxia, unspecified: Secondary | ICD-10-CM | POA: Diagnosis not present

## 2021-04-29 DIAGNOSIS — Z79899 Other long term (current) drug therapy: Secondary | ICD-10-CM | POA: Diagnosis not present

## 2021-04-29 DIAGNOSIS — B349 Viral infection, unspecified: Secondary | ICD-10-CM | POA: Diagnosis not present

## 2021-04-29 DIAGNOSIS — Z885 Allergy status to narcotic agent status: Secondary | ICD-10-CM | POA: Diagnosis not present

## 2021-04-29 DIAGNOSIS — R079 Chest pain, unspecified: Secondary | ICD-10-CM | POA: Diagnosis not present

## 2021-04-29 DIAGNOSIS — J3489 Other specified disorders of nose and nasal sinuses: Secondary | ICD-10-CM | POA: Diagnosis not present

## 2021-04-29 DIAGNOSIS — R42 Dizziness and giddiness: Secondary | ICD-10-CM | POA: Diagnosis not present

## 2021-04-29 DIAGNOSIS — R109 Unspecified abdominal pain: Secondary | ICD-10-CM | POA: Diagnosis not present

## 2021-04-29 DIAGNOSIS — E876 Hypokalemia: Secondary | ICD-10-CM | POA: Diagnosis not present

## 2021-04-29 DIAGNOSIS — Z8546 Personal history of malignant neoplasm of prostate: Secondary | ICD-10-CM | POA: Diagnosis not present

## 2021-04-29 DIAGNOSIS — Z9079 Acquired absence of other genital organ(s): Secondary | ICD-10-CM | POA: Diagnosis not present

## 2021-04-29 DIAGNOSIS — I63519 Cerebral infarction due to unspecified occlusion or stenosis of unspecified middle cerebral artery: Secondary | ICD-10-CM | POA: Diagnosis not present

## 2021-04-29 DIAGNOSIS — I10 Essential (primary) hypertension: Secondary | ICD-10-CM | POA: Diagnosis not present

## 2021-04-29 DIAGNOSIS — E871 Hypo-osmolality and hyponatremia: Secondary | ICD-10-CM | POA: Diagnosis not present

## 2021-04-29 DIAGNOSIS — R2681 Unsteadiness on feet: Secondary | ICD-10-CM | POA: Diagnosis not present

## 2021-04-29 DIAGNOSIS — G319 Degenerative disease of nervous system, unspecified: Secondary | ICD-10-CM | POA: Diagnosis not present

## 2021-04-29 DIAGNOSIS — R319 Hematuria, unspecified: Secondary | ICD-10-CM | POA: Diagnosis not present

## 2021-04-29 DIAGNOSIS — I6350 Cerebral infarction due to unspecified occlusion or stenosis of unspecified cerebral artery: Secondary | ICD-10-CM | POA: Diagnosis not present

## 2021-04-29 DIAGNOSIS — I517 Cardiomegaly: Secondary | ICD-10-CM | POA: Diagnosis not present

## 2021-05-13 DIAGNOSIS — F17210 Nicotine dependence, cigarettes, uncomplicated: Secondary | ICD-10-CM | POA: Diagnosis not present

## 2021-05-13 DIAGNOSIS — R000 Tachycardia, unspecified: Secondary | ICD-10-CM | POA: Diagnosis not present

## 2021-05-13 DIAGNOSIS — D649 Anemia, unspecified: Secondary | ICD-10-CM | POA: Diagnosis not present

## 2021-05-13 DIAGNOSIS — R319 Hematuria, unspecified: Secondary | ICD-10-CM | POA: Diagnosis not present

## 2021-05-13 DIAGNOSIS — I10 Essential (primary) hypertension: Secondary | ICD-10-CM | POA: Diagnosis not present

## 2021-05-13 DIAGNOSIS — R5383 Other fatigue: Secondary | ICD-10-CM | POA: Diagnosis not present

## 2021-05-14 DIAGNOSIS — E7801 Familial hypercholesterolemia: Secondary | ICD-10-CM | POA: Diagnosis not present

## 2021-05-14 DIAGNOSIS — K2100 Gastro-esophageal reflux disease with esophagitis, without bleeding: Secondary | ICD-10-CM | POA: Diagnosis not present

## 2021-05-14 DIAGNOSIS — R5383 Other fatigue: Secondary | ICD-10-CM | POA: Diagnosis not present

## 2021-05-14 DIAGNOSIS — E7849 Other hyperlipidemia: Secondary | ICD-10-CM | POA: Diagnosis not present

## 2021-05-14 DIAGNOSIS — I10 Essential (primary) hypertension: Secondary | ICD-10-CM | POA: Diagnosis not present

## 2021-05-14 DIAGNOSIS — D649 Anemia, unspecified: Secondary | ICD-10-CM | POA: Diagnosis not present

## 2021-05-14 DIAGNOSIS — E559 Vitamin D deficiency, unspecified: Secondary | ICD-10-CM | POA: Diagnosis not present

## 2021-05-14 DIAGNOSIS — E7800 Pure hypercholesterolemia, unspecified: Secondary | ICD-10-CM | POA: Diagnosis not present

## 2021-05-14 DIAGNOSIS — E782 Mixed hyperlipidemia: Secondary | ICD-10-CM | POA: Diagnosis not present

## 2021-05-18 NOTE — Final Progress Note (Signed)
History of Present Illness: Here again for gross hematuria.  3.15.2022: Underwent cysto following CT for hematuria. CT hematuria protocol performed on 2.`18.2022: Results-- 1. No evidence of urinary tract calculus, mass, filling defect, or hydronephrosis. 2. Status post prostatectomy. 3. Coronary artery disease.  Cysto revealed radiation changes to bladder base.   1.3.2023: Since his visit in early 2022, he has had 3-4 episodes of short-lived hematuria, plus, recently in November and December, emergency room visits for gross hematuria.  The last 1 in December precipitated an admission to [facility].  He did not need transfusion.  He has not had recent gross hematuria since his discharge which was in mid December.       Past Medical History:  Diagnosis Date   A-fib ([place])    Anxiety    Arthritis    Cancer ([place])    GERD (gastroesophageal reflux disease)    Hypertension     Past Surgical History:  Procedure Laterality Date   BACK SURGERY     x2   CATARACT EXTRACTION Bilateral    CERVICAL LAMINECTOMY     INSERTION OF MESH N/A 11/23/2013   Procedure: INSERTION OF MESH;  Surgeon: Jamesetta So, MD;  Location: [facility];  Service: General;  Laterality: N/A;   KNEE ARTHROSCOPY Left    ORIF FOOT FRACTURE Right    PROSTATECTOMY     [place]   ROTATOR CUFF REPAIR Right    THUMB AMPUTATION Left    TOTAL KNEE ARTHROPLASTY Left 09/25/2020   Procedure: TOTAL KNEE ARTHROPLASTY;  Surgeon: Paralee Cancel, MD;  Location: [facility];  Service: Orthopedics;  Laterality: Left;  70 mins   tumor retina     TYMPANOPLASTY Left 02/22/2018   Procedure: TYMPANOPLASTY;  Surgeon: [name], MD;  Location: [place];  Service: ENT;  Laterality: Left;   UMBILICAL HERNIA REPAIR N/A 11/23/2013   Procedure: UMBILICAL HERNIORRHAPHY;  Surgeon: Jamesetta So, MD;  Location: [facility];  Service: General;  Laterality: N/A;    Home Medications:  Allergies as of 05/21/2021       Reactions   Morphine  And Related Anaphylaxis   Percocet [oxycodone-acetaminophen]    Itchy/jumpy         Medication List        Accurate as of May 18, 2021  5:29 PM. If you have any questions, ask your nurse or doctor.          ALPRAZolam 0.5 MG tablet Commonly known as: XANAX Take 0.5 mg by mouth 3 (three) times daily as needed for anxiety.   atorvastatin 10 MG tablet Commonly known as: LIPITOR Take 10 mg by mouth once a week.   docusate sodium 100 MG capsule Commonly known as: COLACE Take 1 capsule (100 mg total) by mouth 2 (two) times daily.   HYDROcodone-acetaminophen 5-325 MG tablet Commonly known as: NORCO/VICODIN Take 1-2 tablets by mouth every 6 (six) hours as needed for severe pain.   losartan 25 MG tablet Commonly known as: COZAAR Take 25 mg by mouth daily.   Loteprednol Etabonate 0.5 % Gel Place 1 drop into the right eye daily.   methocarbamol 500 MG tablet Commonly known as: ROBAXIN Take 1 tablet (500 mg total) by mouth every 6 (six) hours as needed for muscle spasms.   pantoprazole 40 MG tablet Commonly known as: PROTONIX Take 40 mg by mouth daily.   polyethylene glycol 17 g packet Commonly known as: MIRALAX / GLYCOLAX Take 17 g by mouth daily as needed  for mild constipation.   Prolensa 0.07 % Soln Generic drug: Bromfenac Sodium Place 1 drop into the right eye at bedtime.        Allergies:  Allergies  Allergen Reactions   Morphine And Related Anaphylaxis   Percocet [Oxycodone-Acetaminophen]     Itchy/jumpy     Family History  Problem Relation Age of Onset   Alzheimer's disease Mother    Alzheimer's disease Father    Prostate cancer Father    Alzheimer's disease Paternal Grandfather    Allergic rhinitis Neg Hx    Asthma Neg Hx    Eczema Neg Hx    Immunodeficiency Neg Hx    Urticaria Neg Hx     Social History:  reports that he quit smoking about 17 years ago. His smoking use included cigarettes. He has a 5.00 pack-year smoking history. He  has never used smokeless tobacco. He reports that he does not drink alcohol and does not use drugs.  ROS: A complete review of systems was performed.  All systems are negative except for pertinent findings as noted.  Physical Exam:  Vital signs in last 24 hours: There were no vitals taken for this visit. Constitutional:  Alert and oriented, No acute distress Cardiovascular: Regular rate  Respiratory: Normal respiratory effort GI: Abdomen is soft, nontender, nondistended, no abdominal masses. No CVAT.  Genitourinary: Normal male phallus, testes are descended bilaterally and non-tender and without masses, scrotum is normal in appearance without lesions or masses, perineum is normal on inspection. Lymphatic: No lymphadenopathy Neurologic: Grossly intact, no focal deficits Psychiatric: Normal mood and affect  I have reviewed prior pt notes  I have reviewed notes from referring/previous physicians--emergency room visit  I have reviewed urinalysis results  I have independently reviewed prior imaging    Impression/Assessment:  Radiation cystitis/hematuria.  Currently urine is fairly clear.  Plan:  As he has had several episodes of gross hematuria over the past year since I last saw him, I will bring him back in the near future for repeat cystoscopy (cystoscope broken now).  I would suggest, if abnormal vasculature seen on urothelium, cauterization.

## 2021-05-21 ENCOUNTER — Other Ambulatory Visit: Payer: Self-pay

## 2021-05-21 ENCOUNTER — Ambulatory Visit (INDEPENDENT_AMBULATORY_CARE_PROVIDER_SITE_OTHER): Payer: Medicare Other | Admitting: Urology

## 2021-05-21 ENCOUNTER — Encounter: Payer: Self-pay | Admitting: Urology

## 2021-05-21 VITALS — BP 123/80 | HR 109

## 2021-05-21 DIAGNOSIS — R310 Gross hematuria: Secondary | ICD-10-CM | POA: Diagnosis not present

## 2021-05-21 DIAGNOSIS — Z8546 Personal history of malignant neoplasm of prostate: Secondary | ICD-10-CM | POA: Diagnosis not present

## 2021-05-21 LAB — URINALYSIS, ROUTINE W REFLEX MICROSCOPIC
Bilirubin, UA: NEGATIVE
Glucose, UA: NEGATIVE
Ketones, UA: NEGATIVE
Leukocytes,UA: NEGATIVE
Nitrite, UA: NEGATIVE
RBC, UA: NEGATIVE
Specific Gravity, UA: 1.01 (ref 1.005–1.030)
Urobilinogen, Ur: 0.2 mg/dL (ref 0.2–1.0)
pH, UA: 7 (ref 5.0–7.5)

## 2021-05-21 LAB — MICROSCOPIC EXAMINATION
Renal Epithel, UA: NONE SEEN /hpf
WBC, UA: NONE SEEN /hpf (ref 0–5)

## 2021-05-21 NOTE — Progress Notes (Signed)

## 2021-05-21 NOTE — Addendum Note (Signed)
Addended by: Tyrone Apple on: 05/21/2021 02:47 PM   Modules accepted: Orders

## 2021-05-31 ENCOUNTER — Other Ambulatory Visit: Payer: Self-pay

## 2021-05-31 ENCOUNTER — Encounter (INDEPENDENT_AMBULATORY_CARE_PROVIDER_SITE_OTHER): Payer: Medicare Other | Admitting: Ophthalmology

## 2021-05-31 DIAGNOSIS — H2703 Aphakia, bilateral: Secondary | ICD-10-CM

## 2021-05-31 DIAGNOSIS — H43813 Vitreous degeneration, bilateral: Secondary | ICD-10-CM

## 2021-05-31 DIAGNOSIS — I10 Essential (primary) hypertension: Secondary | ICD-10-CM | POA: Diagnosis not present

## 2021-05-31 DIAGNOSIS — H59031 Cystoid macular edema following cataract surgery, right eye: Secondary | ICD-10-CM

## 2021-05-31 DIAGNOSIS — R000 Tachycardia, unspecified: Secondary | ICD-10-CM | POA: Diagnosis not present

## 2021-05-31 DIAGNOSIS — R319 Hematuria, unspecified: Secondary | ICD-10-CM | POA: Diagnosis not present

## 2021-05-31 DIAGNOSIS — H35033 Hypertensive retinopathy, bilateral: Secondary | ICD-10-CM | POA: Diagnosis not present

## 2021-05-31 DIAGNOSIS — R5383 Other fatigue: Secondary | ICD-10-CM | POA: Diagnosis not present

## 2021-05-31 DIAGNOSIS — D649 Anemia, unspecified: Secondary | ICD-10-CM | POA: Diagnosis not present

## 2021-06-02 NOTE — Progress Notes (Incomplete)
History of Present Illness: Pt presents for cysto to followup recurrent gross hematuria.  Past Medical History:  Diagnosis Date   A-fib ([place])    Anxiety    Arthritis    Cancer ([place])    GERD (gastroesophageal reflux disease)    Hypertension     Past Surgical History:  Procedure Laterality Date   BACK SURGERY     x2   CATARACT EXTRACTION Bilateral    CERVICAL LAMINECTOMY     INSERTION OF MESH N/A 11/23/2013   Procedure: INSERTION OF MESH;  Surgeon: Jamesetta So, MD;  Location: [facility];  Service: General;  Laterality: N/A;   KNEE ARTHROSCOPY Left    ORIF FOOT FRACTURE Right    PROSTATECTOMY     [place]   ROTATOR CUFF REPAIR Right    THUMB AMPUTATION Left    TOTAL KNEE ARTHROPLASTY Left 09/25/2020   Procedure: TOTAL KNEE ARTHROPLASTY;  Surgeon: Paralee Cancel, MD;  Location: [facility];  Service: Orthopedics;  Laterality: Left;  70 mins   tumor retina     TYMPANOPLASTY Left 02/22/2018   Procedure: TYMPANOPLASTY;  Surgeon: [name], MD;  Location: [place];  Service: ENT;  Laterality: Left;   UMBILICAL HERNIA REPAIR N/A 11/23/2013   Procedure: UMBILICAL HERNIORRHAPHY;  Surgeon: Jamesetta So, MD;  Location: [facility];  Service: General;  Laterality: N/A;    Home Medications:  Allergies as of 06/04/2021       Reactions   Morphine And Related Anaphylaxis   Percocet [oxycodone-acetaminophen]    Itchy/jumpy         Medication List        Accurate as of June 02, 2021  4:54 PM. If you have any questions, ask your nurse or doctor.          ALPRAZolam 0.5 MG tablet Commonly known as: XANAX Take 0.5 mg by mouth 3 (three) times daily as needed for anxiety.   losartan 25 MG tablet Commonly known as: COZAAR Take 25 mg by mouth daily.   Loteprednol Etabonate 0.5 % Gel Place 1 drop into the right eye daily.   pantoprazole 40 MG tablet Commonly known as: PROTONIX Take 40 mg by mouth daily.   Prolensa 0.07 % Soln Generic drug: Bromfenac Sodium Place  1 drop into the right eye at bedtime.        Allergies:  Allergies  Allergen Reactions   Morphine And Related Anaphylaxis   Percocet [Oxycodone-Acetaminophen]     Itchy/jumpy     Family History  Problem Relation Age of Onset   Alzheimer's disease Mother    Alzheimer's disease Father    Prostate cancer Father    Alzheimer's disease Paternal Grandfather    Allergic rhinitis Neg Hx    Asthma Neg Hx    Eczema Neg Hx    Immunodeficiency Neg Hx    Urticaria Neg Hx     Social History:  reports that he quit smoking about 17 years ago. His smoking use included cigarettes. He has a 5.00 pack-year smoking history. He has never used smokeless tobacco. He reports that he does not drink alcohol and does not use drugs.  ROS: A complete review of systems was performed.  All systems are negative except for pertinent findings as noted.  Physical Exam:  Vital signs in last 24 hours: There were no vitals taken for this visit. Constitutional:  Alert and oriented, No acute distress Cardiovascular: Regular rate  Respiratory: Normal respiratory effort Neurologic: Grossly intact, no focal  deficits Psychiatric: Normal mood and affect  I have reviewed prior pt notes  I have reviewed notes from referring/previous physicians  I have reviewed urinalysis results  I have independently reviewed prior imaging  I have reviewed prior PSA results  I have reviewed prior urine culture  Cystoscopy Procedure Note:  Indication: Hematuria  After informed consent and discussion of the procedure and its risks, KENDREW PACI was positioned and prepped in the standard fashion.  Cystoscopy was performed with a flexible cystoscope.   Findings: Urethra:*** Prostate:*** Bladder neck:*** Ureteral orifices:*** Bladder:***  The patient tolerated the procedure well.    Impression/Assessment:  ***  Plan:  ***

## 2021-06-04 ENCOUNTER — Other Ambulatory Visit: Payer: Medicare Other | Admitting: Urology

## 2021-06-04 DIAGNOSIS — R310 Gross hematuria: Secondary | ICD-10-CM

## 2021-06-04 DIAGNOSIS — Z8546 Personal history of malignant neoplasm of prostate: Secondary | ICD-10-CM

## 2021-06-11 DIAGNOSIS — I10 Essential (primary) hypertension: Secondary | ICD-10-CM | POA: Diagnosis not present

## 2021-06-11 DIAGNOSIS — F411 Generalized anxiety disorder: Secondary | ICD-10-CM | POA: Diagnosis not present

## 2021-06-11 DIAGNOSIS — K2100 Gastro-esophageal reflux disease with esophagitis, without bleeding: Secondary | ICD-10-CM | POA: Diagnosis not present

## 2021-06-11 DIAGNOSIS — Z6832 Body mass index (BMI) 32.0-32.9, adult: Secondary | ICD-10-CM | POA: Diagnosis not present

## 2021-06-11 DIAGNOSIS — R5383 Other fatigue: Secondary | ICD-10-CM | POA: Diagnosis not present

## 2021-06-11 DIAGNOSIS — R000 Tachycardia, unspecified: Secondary | ICD-10-CM | POA: Diagnosis not present

## 2021-06-11 DIAGNOSIS — R319 Hematuria, unspecified: Secondary | ICD-10-CM | POA: Diagnosis not present

## 2021-06-11 DIAGNOSIS — F17210 Nicotine dependence, cigarettes, uncomplicated: Secondary | ICD-10-CM | POA: Diagnosis not present

## 2021-06-24 NOTE — Progress Notes (Signed)
History of Present Illness: Here for followup of gross hematuria.  3.15.2022: Underwent cysto following CT for hematuria. CT hematuria protocol performed on 2.`18.2022: Results-- 1. No evidence of urinary tract calculus, mass, filling defect, or hydronephrosis. 2. Status post prostatectomy. 3. Coronary artery disease.   Cysto revealed radiation changes to bladder base.    1.3.2023: Since his visit in early 2022, he has had 3-4 episodes of short-lived hematuria, plus, recently in November and December, emergency room visits for gross hematuria.  The last 1 in December precipitated an admission to [facility].  He did not need transfusion.  He has not had recent gross hematuria since his discharge which was in mid December  Pt had RRP in 2000 by Dr Risa Grill followed by salvage radiotherapy.  2.7.2023: He still has intermittent gross painless hematuria  Past Medical History:  Diagnosis Date   A-fib ([place])    Anxiety    Arthritis    Cancer ([place])    GERD (gastroesophageal reflux disease)    Hypertension     Past Surgical History:  Procedure Laterality Date   BACK SURGERY     x2   CATARACT EXTRACTION Bilateral    CERVICAL LAMINECTOMY     INSERTION OF MESH N/A 11/23/2013   Procedure: INSERTION OF MESH;  Surgeon: Jamesetta So, MD;  Location: [facility];  Service: General;  Laterality: N/A;   KNEE ARTHROSCOPY Left    ORIF FOOT FRACTURE Right    PROSTATECTOMY     [place]   ROTATOR CUFF REPAIR Right    THUMB AMPUTATION Left    TOTAL KNEE ARTHROPLASTY Left 09/25/2020   Procedure: TOTAL KNEE ARTHROPLASTY;  Surgeon: Paralee Cancel, MD;  Location: [facility];  Service: Orthopedics;  Laterality: Left;  70 mins   tumor retina     TYMPANOPLASTY Left 02/22/2018   Procedure: TYMPANOPLASTY;  Surgeon: [name], MD;  Location: [place];  Service: ENT;  Laterality: Left;   UMBILICAL HERNIA REPAIR N/A 11/23/2013   Procedure: UMBILICAL HERNIORRHAPHY;  Surgeon: Jamesetta So, MD;   Location: [facility];  Service: General;  Laterality: N/A;    Home Medications:  Allergies as of 06/25/2021       Reactions   Morphine And Related Anaphylaxis   Percocet [oxycodone-acetaminophen]    Itchy/jumpy         Medication List        Accurate as of June 24, 2021  7:56 PM. If you have any questions, ask your nurse or doctor.          ALPRAZolam 0.5 MG tablet Commonly known as: XANAX Take 0.5 mg by mouth 3 (three) times daily as needed for anxiety.   losartan 25 MG tablet Commonly known as: COZAAR Take 25 mg by mouth daily.   Loteprednol Etabonate 0.5 % Gel Place 1 drop into the right eye daily.   pantoprazole 40 MG tablet Commonly known as: PROTONIX Take 40 mg by mouth daily.   Prolensa 0.07 % Soln Generic drug: Bromfenac Sodium Place 1 drop into the right eye at bedtime.        Allergies:  Allergies  Allergen Reactions   Morphine And Related Anaphylaxis   Percocet [Oxycodone-Acetaminophen]     Itchy/jumpy     Family History  Problem Relation Age of Onset   Alzheimer's disease Mother    Alzheimer's disease Father    Prostate cancer Father    Alzheimer's disease Paternal Grandfather    Allergic rhinitis Neg Hx  Asthma Neg Hx    Eczema Neg Hx    Immunodeficiency Neg Hx    Urticaria Neg Hx     Social History:  reports that he quit smoking about 17 years ago. His smoking use included cigarettes. He has a 5.00 pack-year smoking history. He has never used smokeless tobacco. He reports that he does not drink alcohol and does not use drugs.  ROS: A complete review of systems was performed.  All systems are negative except for pertinent findings as noted.  Physical Exam:  Vital signs in last 24 hours: There were no vitals taken for this visit. Constitutional:  Alert and oriented, No acute distress Cardiovascular: Regular rate  Respiratory: Normal respiratory effort Neurologic: Grossly intact, no focal deficits Psychiatric: Normal mood and  affect  I have reviewed prior pt notes  I have reviewed notes from referring/previous physicians  I have reviewed urinalysis results  I have independently reviewed prior imaging  I have reviewed prior PSA results  Cystoscopy Procedure Note:  Indication: Persistent hematuria  After informed consent and discussion of the procedure and its risks, Philip Mcneil was positioned and prepped in the standard fashion.  Cystoscopy was performed with a flexible cystoscope.   Findings: Urethra: No stricture or lesion Prostate: Absent Bladder neck: Normal Ureteral orifices: Normal Bladder: Multiple papillary lesions in the right bladder neck just lateral to the right ureteral orifice.  The largest of these is 2 cm in size.  The patient tolerated the procedure well.      Impression/Assessment:  Probable bladder cancer with persistent hematuria  Plan:  I have discussed TURBT with the patient as well as retrograde pyelograms.  We will schedule this as an outpatient at [facility] surgical center in the near future.

## 2021-06-25 ENCOUNTER — Other Ambulatory Visit: Payer: Self-pay

## 2021-06-25 ENCOUNTER — Ambulatory Visit (INDEPENDENT_AMBULATORY_CARE_PROVIDER_SITE_OTHER): Payer: Medicare Other | Admitting: Urology

## 2021-06-25 ENCOUNTER — Encounter: Payer: Self-pay | Admitting: Urology

## 2021-06-25 VITALS — BP 142/80 | HR 93 | Wt 213.0 lb

## 2021-06-25 DIAGNOSIS — R310 Gross hematuria: Secondary | ICD-10-CM | POA: Diagnosis not present

## 2021-06-25 DIAGNOSIS — C678 Malignant neoplasm of overlapping sites of bladder: Secondary | ICD-10-CM

## 2021-06-25 DIAGNOSIS — Z8546 Personal history of malignant neoplasm of prostate: Secondary | ICD-10-CM | POA: Diagnosis not present

## 2021-06-25 LAB — URINALYSIS, ROUTINE W REFLEX MICROSCOPIC
Bilirubin, UA: NEGATIVE
Glucose, UA: NEGATIVE
Ketones, UA: NEGATIVE
Leukocytes,UA: NEGATIVE
Nitrite, UA: NEGATIVE
Protein,UA: NEGATIVE
Specific Gravity, UA: 1.01 (ref 1.005–1.030)
Urobilinogen, Ur: 0.2 mg/dL (ref 0.2–1.0)
pH, UA: 7 (ref 5.0–7.5)

## 2021-06-25 LAB — MICROSCOPIC EXAMINATION
Bacteria, UA: NONE SEEN
Epithelial Cells (non renal): NONE SEEN /hpf (ref 0–10)
Renal Epithel, UA: NONE SEEN /hpf
WBC, UA: NONE SEEN /hpf (ref 0–5)

## 2021-06-25 MED ORDER — CIPROFLOXACIN HCL 500 MG PO TABS: 500.0000 mg | ORAL_TABLET | Freq: Once | ORAL | Status: DC

## 2021-07-01 ENCOUNTER — Other Ambulatory Visit: Payer: Self-pay | Admitting: Urology

## 2021-07-16 ENCOUNTER — Encounter (HOSPITAL_BASED_OUTPATIENT_CLINIC_OR_DEPARTMENT_OTHER): Payer: Self-pay | Admitting: Urology

## 2021-07-18 ENCOUNTER — Encounter (HOSPITAL_BASED_OUTPATIENT_CLINIC_OR_DEPARTMENT_OTHER): Payer: Self-pay | Admitting: Urology

## 2021-07-18 ENCOUNTER — Other Ambulatory Visit: Payer: Self-pay

## 2021-07-18 NOTE — Progress Notes (Signed)
Spoke w/ via phone for pre-op interview--- pt ?Lab needs dos----  istat             ?Lab results------ current ekg in epic/ chart ?COVID test -----patient states asymptomatic no test needed ?Arrive at ------- 0730 on 07-22-2021 ?NPO after MN NO Solid Food.  Clear liquids from MN until--- 0630 ?Med rec completed ?Medications to take morning of surgery ----- protonix ?Diabetic medication ----- n/a ?Patient instructed no nail polish to be worn day of surgery ?Patient instructed to bring photo id and insurance card day of surgery ?Patient aware to have Driver (ride ) / caregiver  for 24 hours after surgery -- son, Legrand Como ?Patient Special Instructions ----- n/a ?Pre-Op special Istructions ----- n/a ?Patient verbalized understanding of instructions that were given at this phone interview. ?Patient denies shortness of breath, chest pain, fever, cough at this phone interview.  ?

## 2021-07-22 ENCOUNTER — Encounter (HOSPITAL_BASED_OUTPATIENT_CLINIC_OR_DEPARTMENT_OTHER)
Admission: RE | Disposition: A | Discharge status: Home / Self Care | Payer: Self-pay | Source: Home / Self Care | Attending: Urology

## 2021-07-22 ENCOUNTER — Encounter (HOSPITAL_BASED_OUTPATIENT_CLINIC_OR_DEPARTMENT_OTHER): Payer: Self-pay | Admitting: Urology

## 2021-07-22 ENCOUNTER — Ambulatory Visit (HOSPITAL_BASED_OUTPATIENT_CLINIC_OR_DEPARTMENT_OTHER): Payer: Medicare Other | Admitting: Anesthesiology

## 2021-07-22 ENCOUNTER — Ambulatory Visit (HOSPITAL_BASED_OUTPATIENT_CLINIC_OR_DEPARTMENT_OTHER)
Admission: RE | Admit: 2021-07-22 | Discharge: 2021-07-22 | Disposition: A | Discharge status: Home / Self Care | Payer: Medicare Other | Attending: Urology | Admitting: Urology

## 2021-07-22 ENCOUNTER — Other Ambulatory Visit: Payer: Self-pay

## 2021-07-22 DIAGNOSIS — R319 Hematuria, unspecified: Secondary | ICD-10-CM | POA: Diagnosis not present

## 2021-07-22 DIAGNOSIS — D090 Carcinoma in situ of bladder: Secondary | ICD-10-CM | POA: Diagnosis not present

## 2021-07-22 DIAGNOSIS — N329 Bladder disorder, unspecified: Secondary | ICD-10-CM | POA: Diagnosis not present

## 2021-07-22 DIAGNOSIS — C678 Malignant neoplasm of overlapping sites of bladder: Secondary | ICD-10-CM

## 2021-07-22 DIAGNOSIS — C679 Malignant neoplasm of bladder, unspecified: Secondary | ICD-10-CM

## 2021-07-22 DIAGNOSIS — I10 Essential (primary) hypertension: Secondary | ICD-10-CM | POA: Diagnosis not present

## 2021-07-22 DIAGNOSIS — R310 Gross hematuria: Secondary | ICD-10-CM | POA: Diagnosis not present

## 2021-07-22 DIAGNOSIS — Z8546 Personal history of malignant neoplasm of prostate: Secondary | ICD-10-CM | POA: Insufficient documentation

## 2021-07-22 DIAGNOSIS — K219 Gastro-esophageal reflux disease without esophagitis: Secondary | ICD-10-CM | POA: Diagnosis not present

## 2021-07-22 DIAGNOSIS — Z87891 Personal history of nicotine dependence: Secondary | ICD-10-CM | POA: Insufficient documentation

## 2021-07-22 DIAGNOSIS — R972 Elevated prostate specific antigen [PSA]: Secondary | ICD-10-CM | POA: Diagnosis not present

## 2021-07-22 HISTORY — DX: Personal history of other diseases of the circulatory system: Z86.79

## 2021-07-22 HISTORY — DX: Other retinoschisis and retinal cysts, right eye: H33.191

## 2021-07-22 HISTORY — DX: Generalized anxiety disorder: F41.1

## 2021-07-22 HISTORY — DX: Strabismic amblyopia, left eye: H53.032

## 2021-07-22 HISTORY — DX: Unspecified symptoms and signs involving the genitourinary system: R39.9

## 2021-07-22 HISTORY — DX: Tachycardia, unspecified: R00.0

## 2021-07-22 HISTORY — DX: Unspecified osteoarthritis, unspecified site: M19.90

## 2021-07-22 HISTORY — DX: Hematuria, unspecified: R31.9

## 2021-07-22 HISTORY — DX: Other constipation: K59.09

## 2021-07-22 HISTORY — PX: TRANSURETHRAL RESECTION OF BLADDER TUMOR WITH MITOMYCIN-C: SHX6459

## 2021-07-22 HISTORY — DX: Malignant neoplasm of bladder, unspecified: C67.9

## 2021-07-22 HISTORY — PX: CYSTOSCOPY W/ RETROGRADES: SHX1426

## 2021-07-22 HISTORY — DX: Anemia, unspecified: D64.9

## 2021-07-22 LAB — POCT I-STAT, CHEM 8
BUN: 9 mg/dL (ref 8–23)
Calcium, Ion: 1.13 mmol/L — ABNORMAL LOW (ref 1.15–1.40)
Chloride: 106 mmol/L (ref 98–111)
Creatinine, Ser: 0.8 mg/dL (ref 0.61–1.24)
Glucose, Bld: 98 mg/dL (ref 70–99)
HCT: 45 % (ref 39.0–52.0)
Hemoglobin: 15.3 g/dL (ref 13.0–17.0)
Potassium: 3.9 mmol/L (ref 3.5–5.1)
Sodium: 141 mmol/L (ref 135–145)
TCO2: 25 mmol/L (ref 22–32)

## 2021-07-22 SURGERY — TRANSURETHRAL RESECTION OF BLADDER TUMOR WITH MITOMYCIN-C
Anesthesia: General | Site: Urethra

## 2021-07-22 MED ORDER — LIDOCAINE HCL (PF) 2 % IJ SOLN
INTRAMUSCULAR | Status: AC
  Filled 2021-07-22: qty 5

## 2021-07-22 MED ORDER — MIDAZOLAM HCL 2 MG/2ML IJ SOLN
INTRAMUSCULAR | Status: AC
  Filled 2021-07-22: qty 2

## 2021-07-22 MED ORDER — IOHEXOL 300 MG/ML  SOLN
INTRAMUSCULAR | Status: DC | PRN
  Administered 2021-07-22: 20 mL via URETHRAL

## 2021-07-22 MED ORDER — CEFAZOLIN SODIUM-DEXTROSE 2-4 GM/100ML-% IV SOLN
2.0000 g | INTRAVENOUS | Status: AC
  Administered 2021-07-22: 2 g via INTRAVENOUS

## 2021-07-22 MED ORDER — FENTANYL CITRATE (PF) 100 MCG/2ML IJ SOLN
INTRAMUSCULAR | Status: AC
  Filled 2021-07-22: qty 2

## 2021-07-22 MED ORDER — ACETAMINOPHEN 500 MG PO TABS
ORAL_TABLET | ORAL | Status: AC
  Filled 2021-07-22: qty 2

## 2021-07-22 MED ORDER — ROCURONIUM BROMIDE 100 MG/10ML IV SOLN
INTRAVENOUS | Status: DC | PRN
Start: 2021-07-22 — End: 2021-07-22
  Administered 2021-07-22: 50 mg via INTRAVENOUS

## 2021-07-22 MED ORDER — ONDANSETRON HCL 4 MG/2ML IJ SOLN
INTRAMUSCULAR | Status: DC | PRN
  Administered 2021-07-22: 4 mg via INTRAVENOUS

## 2021-07-22 MED ORDER — SODIUM CHLORIDE 0.9 % IR SOLN
Status: DC | PRN
  Administered 2021-07-22: 6000 mL
  Administered 2021-07-22: 3000 mL

## 2021-07-22 MED ORDER — FENTANYL CITRATE (PF) 100 MCG/2ML IJ SOLN
INTRAMUSCULAR | Status: DC | PRN
  Administered 2021-07-22 (×2): 50 ug via INTRAVENOUS

## 2021-07-22 MED ORDER — GEMCITABINE CHEMO FOR BLADDER INSTILLATION 2000 MG
2000.0000 mg | Freq: Once | INTRAVENOUS | Status: AC
  Administered 2021-07-22: 2000 mg via INTRAVESICAL
  Filled 2021-07-22: qty 2000

## 2021-07-22 MED ORDER — DEXAMETHASONE SODIUM PHOSPHATE 10 MG/ML IJ SOLN
INTRAMUSCULAR | Status: AC
  Filled 2021-07-22: qty 1

## 2021-07-22 MED ORDER — STERILE WATER FOR IRRIGATION IR SOLN
Status: DC | PRN
  Administered 2021-07-22: 500 mL

## 2021-07-22 MED ORDER — MIDAZOLAM HCL 5 MG/5ML IJ SOLN
INTRAMUSCULAR | Status: DC | PRN
  Administered 2021-07-22: 2 mg via INTRAVENOUS

## 2021-07-22 MED ORDER — SUGAMMADEX SODIUM 200 MG/2ML IV SOLN
INTRAVENOUS | Status: DC | PRN
  Administered 2021-07-22: 200 mg via INTRAVENOUS

## 2021-07-22 MED ORDER — PROPOFOL 10 MG/ML IV BOLUS
INTRAVENOUS | Status: DC | PRN
  Administered 2021-07-22: 160 mg via INTRAVENOUS

## 2021-07-22 MED ORDER — ONDANSETRON HCL 4 MG/2ML IJ SOLN
INTRAMUSCULAR | Status: AC
  Filled 2021-07-22: qty 2

## 2021-07-22 MED ORDER — LIDOCAINE HCL (CARDIAC) PF 100 MG/5ML IV SOSY
PREFILLED_SYRINGE | INTRAVENOUS | Status: DC | PRN
Start: 2021-07-22 — End: 2021-07-22
  Administered 2021-07-22: 60 mg via INTRAVENOUS

## 2021-07-22 MED ORDER — ACETAMINOPHEN 500 MG PO TABS
1000.0000 mg | ORAL_TABLET | Freq: Once | ORAL | Status: AC
Start: 2021-07-22 — End: 2021-07-22
  Administered 2021-07-22: 1000 mg via ORAL

## 2021-07-22 MED ORDER — CEFAZOLIN SODIUM-DEXTROSE 2-4 GM/100ML-% IV SOLN
INTRAVENOUS | Status: AC
  Filled 2021-07-22: qty 100

## 2021-07-22 MED ORDER — LACTATED RINGERS IV SOLN: INTRAVENOUS | Status: DC

## 2021-07-22 MED ORDER — FENTANYL CITRATE (PF) 100 MCG/2ML IJ SOLN
25.0000 ug | INTRAMUSCULAR | Status: DC | PRN
  Administered 2021-07-22 (×2): 50 ug via INTRAVENOUS

## 2021-07-22 MED ORDER — ROCURONIUM BROMIDE 10 MG/ML (PF) SYRINGE
PREFILLED_SYRINGE | INTRAVENOUS | Status: AC
  Filled 2021-07-22: qty 10

## 2021-07-22 MED ORDER — DEXAMETHASONE SODIUM PHOSPHATE 4 MG/ML IJ SOLN
INTRAMUSCULAR | Status: DC | PRN
  Administered 2021-07-22: 5 mg via INTRAVENOUS

## 2021-07-22 SURGICAL SUPPLY — 26 items
BAG DRAIN URO-CYSTO SKYTR STRL (DRAIN) ×3 IMPLANT
BAG DRN RND TRDRP ANRFLXCHMBR (UROLOGICAL SUPPLIES) ×2
BAG DRN UROCATH (DRAIN) ×2
BAG URINE DRAIN 2000ML AR STRL (UROLOGICAL SUPPLIES) ×1 IMPLANT
CATH FOLEY 2WAY SLVR  5CC 18FR (CATHETERS) ×3
CATH FOLEY 2WAY SLVR 5CC 18FR (CATHETERS) IMPLANT
CATH INTERMIT  6FR 70CM (CATHETERS) ×3 IMPLANT
CLOTH BEACON ORANGE TIMEOUT ST (SAFETY) ×3 IMPLANT
ELECT REM PT RETURN 9FT ADLT (ELECTROSURGICAL) ×3
ELECTRODE REM PT RTRN 9FT ADLT (ELECTROSURGICAL) ×2 IMPLANT
GLOVE SURG ENC MOIS LTX SZ8 (GLOVE) ×3 IMPLANT
GLOVE SURG UNDER POLY LF SZ7 (GLOVE) ×2 IMPLANT
GOWN STRL REUS W/TWL LRG LVL3 (GOWN DISPOSABLE) ×1 IMPLANT
GOWN STRL REUS W/TWL XL LVL3 (GOWN DISPOSABLE) ×3 IMPLANT
GUIDEWIRE STR DUAL SENSOR (WIRE) ×1 IMPLANT
HOLDER FOLEY CATH W/STRAP (MISCELLANEOUS) ×1 IMPLANT
IV NS IRRIG 3000ML ARTHROMATIC (IV SOLUTION) ×7 IMPLANT
KIT TURNOVER CYSTO (KITS) ×3 IMPLANT
LOOP CUT BIPOLAR 24F LRG (ELECTROSURGICAL) ×3 IMPLANT
MANIFOLD NEPTUNE II (INSTRUMENTS) ×3 IMPLANT
PACK CYSTO (CUSTOM PROCEDURE TRAY) ×3 IMPLANT
SYR TOOMEY IRRIG 70ML (MISCELLANEOUS) ×3
SYRINGE TOOMEY IRRIG 70ML (MISCELLANEOUS) ×2 IMPLANT
TUBE CONNECTING 12X1/4 (SUCTIONS) ×3 IMPLANT
TUBING UROLOGY SET (TUBING) ×1 IMPLANT
WATER STERILE IRR 500ML POUR (IV SOLUTION) ×1 IMPLANT

## 2021-07-22 NOTE — Op Note (Signed)
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 ?Postoperative diagnosis: Same ? ?Principal procedure: Cystoscopy, bilateral retrograde ureteropyelogram's, fluoroscopic interpretation, transurethral resection of 4 cm bladder tumor, placement of intravesical gemcitabine ? ?Surgeon: Diona Fanti ? ?Anesthesia: General ? ?Complications: None ? ?Estimated blood loss: Less than 25 mL ? ?Specimen: Bladder tumor, to pathology ? ?Indications: 71 year old male with history of prostate cancer.  He is years out from radical prostatectomy as well as salvage radiotherapy for recurrent PSA.  The patient has had intermittent hematuria.  Evaluation has included CT of the abdomen and pelvis revealing normal upper tracts and normal-appearing bladder.  Cystoscopy revealed papillary lesions on the right bladder neck/lateral wall region.  He presents at this time for resection of this as well as probable gemcitabine placement.  I discussed the procedure with the patient.  Risks and complications including but not limited to anesthetic complications, perforation, symptoms from gemcitabine, bleeding, infection have been discussed.  He understands these and desires to proceed. ? ?Findings: Urethra was normal.  Prostate was surgically absent.  Mild bladder neck contracture present.  Ureteral orifices were normal in their location and configuration.  There were multiple papillary, low-lying lesions in the right anterior/lateral bladder neck region.  There were 2 fairly sizable papillary lesions beside these.  No other urothelial lesions were noted on the bladder.  Retrograde studies of both ureters revealed delicate ureters bilaterally without evidence of filling defect or stricture, no hydronephrosis.  Pyelocalyceal systems were normal as well. ? ?Description of procedure: The patient was properly identified in the holding area, taken to the operating room where he was placed on the operating  table, general anesthetic was established and he was placed in the dorsolithotomy position.  Genitalia and perineum were prepped, draped, proper timeout performed. ? ?70 French panendoscope was passed into the bladder.  Mild bladder neck contracture passed.  Retrograde study of both ureters performed using Omnipaque and a 6 [place] open-ended catheter.  The right ureteral orifice was somewhat narrow and I used a sensor tip guidewire to assist with cannulation.  Once the Omnipaque was placed and retrograde studies were normal, the open-ended catheter was removed.  I then removed the cystoscope and easily passed a 26 French resectoscope sheath with the visual obturator.  The resectoscope and cutting loop were then placed.  Once the abnormal urothelium was identified on the anterior lateral right bladder neck, the tumorous areas were then resected into the muscular layer with the cutting loop.  Overall, there was a 4 cm base of resection.  Following resection of all abnormal urothelium, cautery was utilized to cauterize the edges of the resection site as well as the base.  No significant perforation was seen at this point.  Tumor fragments were irrigated from the bladder and sent labeled "bladder tumor".  Careful inspection of the resected site revealed adequate hemostasis.  1 small area of mild bleeding was seen in the bladder neck area at the 6 o'clock position.  This was also carefully cauterized.  At this point hemostasis was excellent.  The scope was then removed.  2 French Foley catheter placed, balloon filled with 10 cc of water and this was then hooked to dependent drainage. ? ?The patient was then awakened.  He was taken to the PACU in stable condition, having tolerated the procedure well.  At this point, in the PACU, 2 g of gemcitabine were instilled in the bladder.  This was left for 1 hour and then drained. ?

## 2021-07-22 NOTE — H&P (Signed)
H&P ? ?Chief Complaint: Bladder cancer ? ?History of Present Illness: 71 yo male w/ h/o PCa (s/p RARP) as well as salvage radiotherapy presents for TURBT/(B) RGPS as well as gemcitabine for mgmt of a 2 cm Lt bladder wall papillary lesion. ? ?Past Medical History:  ?Diagnosis Date  ? Anemia   ? Bladder cancer ([place])   ? urologist-- dr Diona Fanti  ? Chronic constipation   ? GAD (generalized anxiety disorder)   ? GERD (gastroesophageal reflux disease)   ? Hematuria   ? History of atrial fibrillation   ? ED visit in recovery post tympanoplasty [id number] with brief episode atrial fib, per note cardiology recommended pt follow up with pcp  ? 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 hospital admission ([facility]) 04-29-2021 dx orthostatic hypotension, pneumonia, dehydration, generalized weakness, stroke;  per MRI in care everywhere small chronic cortical infarct within left parietal lobe postcenral gyrus  ? History of external beam radiation therapy 2000  ? salvage post prostatectomy  ? History of prostate cancer 2000  ? followed by dr Diona Fanti;   s/p  radical prostatectomy and salvage radiation in 2000  ? Hypertension   ? followed by pcp;;  (hx admission [facility] 2012 had normal nuclear stress test & echo 08-19-2010 in epic) ;;   previously seen by cardiology , dr Bronson Ing, [facility] Clement note in epic 06-15-2017  ? Lower urinary tract symptoms (LUTS)   ? OA (osteoarthritis)   ? Retinal cyst of right eye   ? per pt w/ floaters  ? Strabismic amblyopia of left eye   ? per pt w/ decreased vision , stated 20/ 200  ? Tachycardia   ? ? ?Past Surgical History:  ?Procedure Laterality Date  ? CATARACT EXTRACTION W/ INTRAOCULAR LENS IMPLANT Bilateral   ? 2020  ? INSERTION OF MESH N/A 11/23/2013  ? Procedure: INSERTION OF MESH;  Surgeon: Jamesetta So, MD;  Location: [facility];  Service: General;  Laterality: N/A;  ? KNEE ARTHROSCOPY Right   ? unsure date  ? LUMBAR SPINE SURGERY    ? x2  in 29s  ?  PERCUTANEOUS PINNING Right   ? age 39;    right foot fracture  ? POSTERIOR CERVICAL LAMINECTOMY    ? 1990s  ? RETROPUBIC PROSTATECTOMY  2000  ? '@[facility]'$ :   per pt had Right inguinal hernia repair  ? ROTATOR CUFF REPAIR Right 2004  ? STRABISMUS SURGERY Left   ? age 25 and 53  ? THUMB AMPUTATION Left 2003  ? revision of amputation injusry  ? TOTAL KNEE ARTHROPLASTY Left 09/25/2020  ? Procedure: TOTAL KNEE ARTHROPLASTY;  Surgeon: Paralee Cancel, MD;  Location: [facility];  Service: Orthopedics;  Laterality: Left;  70 mins  ? TYMPANOPLASTY Left 02/22/2018  ? Procedure: TYMPANOPLASTY;  Surgeon: [name], MD;  Location: [place];  Service: ENT;  Laterality: Left;  ? UMBILICAL HERNIA REPAIR N/A 11/23/2013  ? Procedure: UMBILICAL HERNIORRHAPHY;  Surgeon: Jamesetta So, MD;  Location: [facility];  Service: General;  Laterality: N/A;  ? ? ?Home Medications:  ? ? ?Allergies:  ?Allergies  ?Allergen Reactions  ? Morphine And Related Anaphylaxis  ? Oxycodone Other (See Comments)  ?  Itchy / jumpy (Percocet)  ? ? ?Family History  ?Problem Relation Age of Onset  ? Alzheimer's disease Mother   ? Alzheimer's disease Father   ? Prostate cancer Father   ? Alzheimer's disease Paternal Grandfather   ? Allergic rhinitis Neg Hx   ?  Asthma Neg Hx   ? Eczema Neg Hx   ? Immunodeficiency Neg Hx   ? Urticaria Neg Hx   ? ? ?Social History:  reports that he quit smoking about 17 years ago. His smoking use included cigarettes. He has a 5.00 pack-year smoking history. He has never used smokeless tobacco. He reports that he does not drink alcohol and does not use drugs. ? ?ROS: ?A complete review of systems was performed.  All systems are negative except for pertinent findings as noted. ? ?Physical Exam:  ?Vital signs in last 24 hours: ?Ht '5\' 8"'$  (1.727 m)   Wt 97.5 kg   BMI 32.69 kg/m?  ?Constitutional:  Alert and oriented, No acute distress ?Cardiovascular: Regular rate  ?Respiratory: Normal respiratory effort ?Psychiatric: Normal mood and  affect ? ?I have reviewed prior pt notes ? ?I have reviewed urinalysis results ? ? ? ?Impression/Assessment:  ?Bladder cancer ? ?Plan:  ?Cysto, TURBT, (B) RGPS, gemcitabine ? ?

## 2021-07-22 NOTE — OR Nursing (Signed)
Pt and pt son instructed on foley cathetor care and how to d?c cathetor. Darin Engels rn ?

## 2021-07-22 NOTE — Anesthesia Procedure Notes (Signed)
Procedure Name: Intubation ?Date/Time: 07/22/2021 9:19 AM ?Performed by: Maryella Shivers, CRNA ?Pre-anesthesia Checklist: Patient identified, Emergency Drugs available, Suction available and Patient being monitored ?Patient Re-evaluated:Patient Re-evaluated prior to induction ?Oxygen Delivery Method: Circle system utilized ?Preoxygenation: Pre-oxygenation with 100% oxygen ?Induction Type: IV induction ?Ventilation: Mask ventilation without difficulty ?Laryngoscope Size: Mac and 4 ?Grade View: Grade I ?Tube type: Oral ?Tube size: 7.5 mm ?Number of attempts: 1 ?Airway Equipment and Method: Stylet and Oral airway ?Placement Confirmation: ETT inserted through vocal cords under direct vision, positive ETCO2 and breath sounds checked- equal and bilateral ?Secured at: 21 cm ?Tube secured with: Tape ?Dental Injury: Teeth and Oropharynx as per pre-operative assessment  ? ? ? ? ?

## 2021-07-22 NOTE — Transfer of Care (Signed)
Immediate Anesthesia Transfer of Care Note ? ?Patient: Philip Mcneil ? ?Procedure(s) Performed: TRANSURETHRAL RESECTION OF BLADDER TUMOR WITH GEMCITABINE (Bladder) ?CYSTOSCOPY WITH RETROGRADE PYELOGRAM (Bilateral: Urethra) ? ?Patient Location: PACU ? ?Anesthesia Type:General ? ?Level of Consciousness: awake, alert  and oriented ? ?Airway & Oxygen Therapy: Patient Spontanous Breathing and Patient connected to face mask oxygen ? ?Post-op Assessment: Report given to RN and Post -op Vital signs reviewed and stable ? ?Post vital signs: Reviewed and stable ? ?Last Vitals:  ?Vitals Value Taken Time  ?BP 144/82 07/22/21 1001  ?Temp    ?Pulse 70 07/22/21 1003  ?Resp 9 07/22/21 1002  ?SpO2 99 % 07/22/21 1003  ?Vitals shown include unvalidated device data. ? ?Last Pain:  ?Vitals:  ? 07/22/21 0719  ?TempSrc: Oral  ?PainSc: 0-No pain  ?   ? ?Patients Stated Pain Goal: 9 (07/22/21 0719) ? ?Complications: No notable events documented. ?

## 2021-07-22 NOTE — Anesthesia Postprocedure Evaluation (Signed)
Anesthesia Post Note ? ?Patient: Philip Mcneil ? ?Procedure(s) Performed: TRANSURETHRAL RESECTION OF BLADDER TUMOR WITH GEMCITABINE (Bladder) ?CYSTOSCOPY WITH RETROGRADE PYELOGRAM (Bilateral: Urethra) ? ?  ? ?Patient location during evaluation: PACU ?Anesthesia Type: General ?Level of consciousness: awake and alert ?Pain management: pain level controlled ?Vital Signs Assessment: post-procedure vital signs reviewed and stable ?Respiratory status: spontaneous breathing, nonlabored ventilation, respiratory function stable and patient connected to nasal cannula oxygen ?Cardiovascular status: blood pressure returned to baseline and stable ?Postop Assessment: no apparent nausea or vomiting ?Anesthetic complications: no ? ? ?No notable events documented. ? ?Last Vitals:  ?Vitals:  ? 07/22/21 1106 07/22/21 1210  ?BP:  (!) 154/70  ?Pulse: 65 83  ?Resp: 14 20  ?Temp:    ?SpO2: 97% 98%  ?  ?Last Pain:  ?Vitals:  ? 07/22/21 1210  ?TempSrc:   ?PainSc: 0-No pain  ? ? ?  ?  ?  ?  ?  ?  ? ?Shavonne Ambroise ? ? ? ? ?

## 2021-07-22 NOTE — Interval H&P Note (Signed)
History and Physical Interval Note: ? ?07/22/2021 ?7:55 AM ? ?Philip Mcneil  has presented today for surgery, with the diagnosis of BLADDER CANCER.  The various methods of treatment have been discussed with the patient and family. After consideration of risks, benefits and other options for treatment, the patient has consented to  Procedure(s): ?TRANSURETHRAL RESECTION OF BLADDER TUMOR WITH GEMCITABINE (N/A) ?CYSTOSCOPY WITH RETROGRADE PYELOGRAM (Bilateral) as a surgical intervention.  The patient's history has been reviewed, patient examined, no change in status, stable for surgery.  I have reviewed the patient's chart and labs.  Questions were answered to the patient's satisfaction.   ? ? ?[name] ? ? ?

## 2021-07-22 NOTE — Anesthesia Preprocedure Evaluation (Signed)
Anesthesia Evaluation  ?Patient identified by MRN, date of birth, ID band ?Patient awake ? ? ? ?Reviewed: ?Allergy & Precautions, NPO status , Patient's Chart, lab work & pertinent test results ? ?History of Anesthesia Complications ?Negative for: history of anesthetic complications ? ?Airway ?Mallampati: I ? ?TM Distance: >3 FB ?Neck ROM: Full ? ? ? Dental ? ?(+) Teeth Intact, Dental Advisory Given ?  ?Pulmonary ?neg shortness of breath, neg sleep apnea, neg COPD, neg recent URI, former smoker, neg PE ?  ?breath sounds clear to auscultation ? ? ? ? ? ? Cardiovascular ?hypertension, Pt. on medications ?(-) angina(-) Past MI and (-) CHF (-) dysrhythmias  ?Rhythm:Regular  ?12/22: ?\ ??1. The left ventricle is normal in size with normal wall thickness.  ???2. The left ventricular systolic function is normal, LVEF is visually  ?estimated at 65-70%.  ???3. The left atrium is mildly dilated in size.  ???4. The right ventricle is normal in size, with normal systolic function.  ???5. Agitated saline study is negative.  ? ?  ?Neuro/Psych ?PSYCHIATRIC DISORDERS Anxiety negative neurological ROS ?   ? GI/Hepatic ?Neg liver ROS, GERD  Medicated and Controlled,  ?Endo/Other  ?negative endocrine ROS ? Renal/GU ?BLADDER CANCER ? ?Lab Results ?     Component                Value               Date                 ?     CREATININE               0.80                07/22/2021           ?Lab Results ?     Component                Value               Date                 ?     K                        3.9                 07/22/2021           ?  ? ?  ?Musculoskeletal ? ?(+) Arthritis ,  ? Abdominal ?  ?Peds ? Hematology ?negative hematology ROS ?(+) Lab Results ?     Component                Value               Date                 ?     WBC                      4.3                 04/11/2021           ?     HGB                      15.3                07/22/2021           ?  HCT                       45.0                07/22/2021           ?     MCV                      88.7                04/11/2021           ?     PLT                      245                 04/11/2021           ?   ?Anesthesia Other Findings ? ? Reproductive/Obstetrics ? ?  ? ? ? ? ? ? ? ? ? ? ? ? ? ?  ?  ? ? ? ? ? ? ? ? ?Anesthesia Physical ?Anesthesia Plan ? ?ASA: 2 ? ?Anesthesia Plan: General  ? ?Post-op Pain Management: Tylenol PO (pre-op)*  ? ?Induction: Intravenous ? ?PONV Risk Score and Plan: 2 and Dexamethasone and Ondansetron ? ?Airway Management Planned: LMA and Oral ETT ? ?Additional Equipment: None ? ?Intra-op Plan:  ? ?Post-operative Plan: Extubation in OR ? ?Informed Consent: I have reviewed the patients History and Physical, chart, labs and discussed the procedure including the risks, benefits and alternatives for the proposed anesthesia with the patient or authorized representative who has indicated his/her understanding and acceptance.  ? ? ? ?Dental advisory given ? ?Plan Discussed with: CRNA and Anesthesiologist ? ?Anesthesia Plan Comments:   ? ? ? ? ? ? ?Anesthesia Quick Evaluation ? ?

## 2021-07-22 NOTE — Discharge Instructions (Addendum)
You may see some blood in the urine and may have some burning with urination for 48-72 hours. You also may notice that you have to urinate more frequently or urgently after your procedure which is normal.  ?You should call should you develop an inability urinate, fever > 101, persistent nausea and vomiting that prevents you from eating or drinking to stay hydrated.  ? ?If you have a catheter, you will be taught how to take care of the catheter by the nursing staff prior to discharge from the hospital.  You may periodically feel a strong urge to void with the catheter in place.  This is a bladder spasm and most often can occur when having a bowel movement or moving around. It is typically self-limited and usually will stop after a few minutes.  You may use some Vaseline or Neosporin around the tip of the catheter to reduce friction at the tip of the penis. You may also see some blood in the urine.  A very small amount of blood can make the urine look quite red.  As long as the catheter is draining well, there usually is not a problem.  However, if the catheter is not draining well and is bloody, you should call the office [phone]) to notify us.  It is okay to remove the catheter as instructed by the nursing staff on Tuesday morning. ? ?Post Anesthesia Home Care Instructions ? ?Activity: ?Get plenty of rest for the remainder of the day. A responsible adult should stay with you for 24 hours following the procedure.  ?For the next 24 hours, DO NOT: ?-Drive a car ?-Paediatric nurse ?-Drink alcoholic beverages ?-Take any medication unless instructed by your physician ?-Make any legal decisions or sign important papers. ? ?Meals: ?Start with liquid foods such as gelatin or soup. Progress to regular foods as tolerated. Avoid greasy, spicy, heavy foods. If nausea and/or vomiting occur, drink only clear liquids until the nausea and/or vomiting subsides. Call your physician if vomiting continues. ? ?Special  Instructions/Symptoms: ?Your throat may feel dry or sore from the anesthesia or the breathing tube placed in your throat during surgery. If this causes discomfort, gargle with warm salt water. The discomfort should disappear within 24 hours. ? ? ?

## 2021-07-23 ENCOUNTER — Encounter (HOSPITAL_BASED_OUTPATIENT_CLINIC_OR_DEPARTMENT_OTHER): Payer: Self-pay | Admitting: Urology

## 2021-07-24 LAB — SURGICAL PATHOLOGY

## 2021-08-16 DIAGNOSIS — E7800 Pure hypercholesterolemia, unspecified: Secondary | ICD-10-CM | POA: Diagnosis not present

## 2021-08-16 DIAGNOSIS — E7801 Familial hypercholesterolemia: Secondary | ICD-10-CM | POA: Diagnosis not present

## 2021-08-16 DIAGNOSIS — I10 Essential (primary) hypertension: Secondary | ICD-10-CM | POA: Diagnosis not present

## 2021-08-16 DIAGNOSIS — E782 Mixed hyperlipidemia: Secondary | ICD-10-CM | POA: Diagnosis not present

## 2021-08-16 DIAGNOSIS — K2100 Gastro-esophageal reflux disease with esophagitis, without bleeding: Secondary | ICD-10-CM | POA: Diagnosis not present

## 2021-08-16 DIAGNOSIS — E7849 Other hyperlipidemia: Secondary | ICD-10-CM | POA: Diagnosis not present

## 2021-08-19 NOTE — Progress Notes (Signed)
? ?History of Present Illness:  ? ?Here for follow up after TURBT of a large bladder tumor performed om 3.6.2023.  Tumor location was right anterior/lateral bladder wall.  Proximately 4 cm in size.  Gemcitabine placed postoperatively. ? ?Path--HG NMIBC ? ?He is here today to discuss further management.  He is overall doing well.  Initial urgency and urgency incontinence has improved. ?Past Medical History:  ?Diagnosis Date  ? Anemia   ? Bladder cancer ([place])   ? urologist-- dr Diona Fanti  ? Chronic constipation   ? GAD (generalized anxiety disorder)   ? GERD (gastroesophageal reflux disease)   ? Hematuria   ? History of atrial fibrillation   ? ED visit in recovery post tympanoplasty [phone] with brief episode atrial fib, per note cardiology recommended pt follow up with pcp  ? 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 hospital admission ([facility]) 04-29-2021 dx orthostatic hypotension, pneumonia, dehydration, generalized weakness, stroke;  per MRI in care everywhere small chronic cortical infarct within left parietal lobe postcenral gyrus  ? History of external beam radiation therapy 2000  ? salvage post prostatectomy  ? History of prostate cancer 2000  ? followed by dr Diona Fanti;   s/p  radical prostatectomy and salvage radiation in 2000  ? Hypertension   ? followed by pcp;;  (hx admission [facility] 2012 had normal nuclear stress test & echo 08-19-2010 in epic) ;;   previously seen by cardiology , dr Bronson Ing, Cassell Clement note in epic 06-15-2017  ? Lower urinary tract symptoms (LUTS)   ? OA (osteoarthritis)   ? Retinal cyst of right eye   ? per pt w/ floaters  ? Strabismic amblyopia of left eye   ? per pt w/ decreased vision , stated 20/ 200  ? Tachycardia   ? ? ?Past Surgical History:  ?Procedure Laterality Date  ? CATARACT EXTRACTION W/ INTRAOCULAR LENS IMPLANT Bilateral   ? 2020  ? CYSTOSCOPY W/ RETROGRADES Bilateral 07/22/2021  ? Procedure: CYSTOSCOPY WITH RETROGRADE PYELOGRAM;   Surgeon: Franchot Gallo, MD;  Location: [facility];  Service: Urology;  Laterality: Bilateral;  ? INSERTION OF MESH N/A 11/23/2013  ? Procedure: INSERTION OF MESH;  Surgeon: Jamesetta So, MD;  Location: [facility];  Service: General;  Laterality: N/A;  ? KNEE ARTHROSCOPY Right   ? unsure date  ? LUMBAR SPINE SURGERY    ? x2  in 50s  ? PERCUTANEOUS PINNING Right   ? age 69;    right foot fracture  ? POSTERIOR CERVICAL LAMINECTOMY    ? 1990s  ? RETROPUBIC PROSTATECTOMY  2000  ? [facility] :   per pt had Right inguinal hernia repair  ? ROTATOR CUFF REPAIR Right 2004  ? STRABISMUS SURGERY Left   ? age 59 and 51  ? THUMB AMPUTATION Left 2003  ? revision of amputation injusry  ? TOTAL KNEE ARTHROPLASTY Left 09/25/2020  ? Procedure: TOTAL KNEE ARTHROPLASTY;  Surgeon: Paralee Cancel, MD;  Location: [facility];  Service: Orthopedics;  Laterality: Left;  70 mins  ? TRANSURETHRAL RESECTION OF BLADDER TUMOR WITH MITOMYCIN-C N/A 07/22/2021  ? Procedure: TRANSURETHRAL RESECTION OF BLADDER TUMOR WITH GEMCITABINE;  Surgeon: Franchot Gallo, MD;  Location: [facility];  Service: Urology;  Laterality: N/A;  ? TYMPANOPLASTY Left 02/22/2018  ? Procedure: TYMPANOPLASTY;  Surgeon: [name], MD;  Location: [place];  Service: ENT;  Laterality: Left;  ? UMBILICAL HERNIA REPAIR N/A 11/23/2013  ? Procedure: UMBILICAL HERNIORRHAPHY;  Surgeon: Jeannette How  Arnoldo Morale, MD;  Location: [facility];  Service: General;  Laterality: N/A;  ? ? ?Home Medications:  ?Allergies as of 08/20/2021   ? ?   Reactions  ? Morphine And Related Anaphylaxis  ? Oxycodone Other (See Comments)  ? Itchy / jumpy (Percocet)  ? ?  ? ?  ?Medication List  ?  ? ?  ? Accurate as of August 19, 2021  8:34 PM. If you have any questions, ask your nurse or doctor.  ?  ?  ? ?  ? ?ALPRAZolam 0.5 MG tablet ?Commonly known as: Duanne Moron ?Take 0.5 mg by mouth 3 (three) times daily as needed for anxiety. ?  ?losartan 25 MG tablet ?Commonly known as: COZAAR ?Take 25  mg by mouth daily. ?  ?Loteprednol Etabonate 0.5 % Gel ?Place 1 drop into the right eye daily. ?  ?pantoprazole 40 MG tablet ?Commonly known as: PROTONIX ?Take 40 mg by mouth daily. ?  ?Prolensa 0.07 % Soln ?Generic drug: Bromfenac Sodium ?Place 1 drop into the right eye at bedtime. ?  ? ?  ? ? ?Allergies:  ?Allergies  ?Allergen Reactions  ? Morphine And Related Anaphylaxis  ? Oxycodone Other (See Comments)  ?  Itchy / jumpy (Percocet)  ? ? ?Family History  ?Problem Relation Age of Onset  ? Alzheimer's disease Mother   ? Alzheimer's disease Father   ? Prostate cancer Father   ? Alzheimer's disease Paternal Grandfather   ? Allergic rhinitis Neg Hx   ? Asthma Neg Hx   ? Eczema Neg Hx   ? Immunodeficiency Neg Hx   ? Urticaria Neg Hx   ? ? ?Social History:  reports that he quit smoking about 17 years ago. His smoking use included cigarettes. He has a 5.00 pack-year smoking history. He has never used smokeless tobacco. He reports that he does not drink alcohol and does not use drugs. ? ?ROS: ?A complete review of systems was performed.  All systems are negative except for pertinent findings as noted. ? ?Physical Exam:  ?Vital signs in last 24 hours: ?There were no vitals taken for this visit. ?Constitutional:  Alert and oriented, No acute distress ?Cardiovascular: Regular rate  ?Respiratory: Normal respiratory effort ?Neurologic: Grossly intact, no focal deficits ?Psychiatric: Normal mood and affect ? ?I have reviewed prior pt office notes ? ?Operative note reviewed ? ?I have reviewed urinalysis results ? ?I have independently reviewed prior imaging ? ?Pathology reviewed with the patient and his son ? ? ? ? 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 ? ?Plan:  ?1.  I did recommend repeat resection as he does have large volume of NMIBC resected ? ?2.  I also discussed eventual BCG induction with them ? ?3.  We will call to set up his second procedure to be done as  an outpatient at [facility] center ? ?33 minutes spent reviewing patient's pathology both prior to and during appointment wellness direct face-to-face time with the patient and his son. ? ?

## 2021-08-20 ENCOUNTER — Encounter: Payer: Self-pay | Admitting: Urology

## 2021-08-20 ENCOUNTER — Ambulatory Visit (INDEPENDENT_AMBULATORY_CARE_PROVIDER_SITE_OTHER): Payer: Medicare Other | Admitting: Urology

## 2021-08-20 VITALS — BP 148/86 | HR 90 | Ht 68.0 in | Wt 210.0 lb

## 2021-08-20 DIAGNOSIS — N320 Bladder-neck obstruction: Secondary | ICD-10-CM

## 2021-08-20 DIAGNOSIS — C678 Malignant neoplasm of overlapping sites of bladder: Secondary | ICD-10-CM | POA: Diagnosis not present

## 2021-08-20 LAB — MICROSCOPIC EXAMINATION
Bacteria, UA: NONE SEEN
Renal Epithel, UA: NONE SEEN /hpf

## 2021-08-20 LAB — URINALYSIS, ROUTINE W REFLEX MICROSCOPIC
Bilirubin, UA: NEGATIVE
Glucose, UA: NEGATIVE
Ketones, UA: NEGATIVE
Nitrite, UA: NEGATIVE
Protein,UA: NEGATIVE
Specific Gravity, UA: 1.02 (ref 1.005–1.030)
Urobilinogen, Ur: 0.2 mg/dL (ref 0.2–1.0)
pH, UA: 7 (ref 5.0–7.5)

## 2021-08-21 DIAGNOSIS — R000 Tachycardia, unspecified: Secondary | ICD-10-CM | POA: Diagnosis not present

## 2021-08-21 DIAGNOSIS — F17210 Nicotine dependence, cigarettes, uncomplicated: Secondary | ICD-10-CM | POA: Diagnosis not present

## 2021-08-21 DIAGNOSIS — I10 Essential (primary) hypertension: Secondary | ICD-10-CM | POA: Diagnosis not present

## 2021-08-21 DIAGNOSIS — R319 Hematuria, unspecified: Secondary | ICD-10-CM | POA: Diagnosis not present

## 2021-08-21 DIAGNOSIS — K2100 Gastro-esophageal reflux disease with esophagitis, without bleeding: Secondary | ICD-10-CM | POA: Diagnosis not present

## 2021-08-21 DIAGNOSIS — R5383 Other fatigue: Secondary | ICD-10-CM | POA: Diagnosis not present

## 2021-08-21 DIAGNOSIS — F411 Generalized anxiety disorder: Secondary | ICD-10-CM | POA: Diagnosis not present

## 2021-08-21 DIAGNOSIS — Z6833 Body mass index (BMI) 33.0-33.9, adult: Secondary | ICD-10-CM | POA: Diagnosis not present

## 2021-08-27 ENCOUNTER — Other Ambulatory Visit: Payer: Self-pay | Admitting: Urology

## 2021-09-30 ENCOUNTER — Encounter (HOSPITAL_BASED_OUTPATIENT_CLINIC_OR_DEPARTMENT_OTHER): Payer: Self-pay | Admitting: Urology

## 2021-09-30 ENCOUNTER — Other Ambulatory Visit: Payer: Self-pay

## 2021-09-30 NOTE — Progress Notes (Signed)
Spoke w/ via phone for pre-op interview--- pt ?Lab needs dos----   [facility] , ekg            ?Lab results------ no ?COVID test -----patient states asymptomatic no test needed ?Arrive at ------- 0700 on 10-03-2021 ?NPO after MN NO Solid Food.  Clear liquids from MN until--- 0600 ?Med rec completed ?Medications to take morning of surgery ----- protonix ?Diabetic medication ----- n/a ?Patient instructed no nail polish to be worn day of surgery ?Patient instructed to bring photo id and insurance card day of surgery ?Patient aware to have Driver (ride ) / caregiver for 24 hours after surgery -- son, Legrand Como ?Patient Special Instructions ----- n/a ?Pre-Op special Istructions ----- n/a ?Patient verbalized understanding of instructions that were given at this phone interview. ?Patient denies shortness of breath, chest pain, fever, cough at this phone interview.  ?

## 2021-10-02 NOTE — Anesthesia Preprocedure Evaluation (Addendum)
Anesthesia Evaluation  Patient identified by MRN, date of birth, ID band Patient awake    Reviewed: Allergy & Precautions, NPO status , Patient's Chart, lab work & pertinent test results  History of Anesthesia Complications Negative for: history of anesthetic complications  Airway Mallampati: II  TM Distance: >3 FB Neck ROM: Full    Dental  (+) Dental Advisory Given   Pulmonary former smoker,    Pulmonary exam normal        Cardiovascular hypertension, Pt. on medications Normal cardiovascular exam+ dysrhythmias Atrial Fibrillation    '22 TTE - EF 65-70%. The left atrium is mildly dilated. Trivial TR.      Neuro/Psych PSYCHIATRIC DISORDERS Anxiety CVA, No Residual Symptoms    GI/Hepatic Neg liver ROS, GERD  Medicated and Controlled,  Endo/Other   Obesity   Renal/GU negative Renal ROS Bladder dysfunction   Bladder tumor     Musculoskeletal  (+) Arthritis ,   Abdominal   Peds  Hematology negative hematology ROS (+)   Anesthesia Other Findings   Reproductive/Obstetrics                            Anesthesia Physical  Anesthesia Plan  ASA: 3  Anesthesia Plan: General   Post-op Pain Management: Tylenol PO (pre-op)*   Induction: Intravenous  PONV Risk Score and Plan: 2 and Dexamethasone, Ondansetron and Treatment may vary due to age or medical condition  Airway Management Planned: LMA and Oral ETT  Additional Equipment: None  Intra-op Plan:   Post-operative Plan: Extubation in OR  Informed Consent: I have reviewed the patients History and Physical, chart, labs and discussed the procedure including the risks, benefits and alternatives for the proposed anesthesia with the patient or authorized representative who has indicated his/her understanding and acceptance.     Dental advisory given  Plan Discussed with: CRNA and Anesthesiologist  Anesthesia Plan Comments: (ETT if  paralysis requested, otherwise LMA )       Anesthesia Quick Evaluation

## 2021-10-02 NOTE — Discharge Instructions (Addendum)
You may see some blood in the urine and may have some burning with urination for 48-72 hours. You also may notice that you have to urinate more frequently or urgently after your procedure which is normal.  You should call should you develop an inability urinate, fever > 101, persistent nausea and vomiting that prevents you from eating or drinking to stay hydrated. If you have a catheter, you will be taught how to take care of the catheter by the nursing staff prior to discharge from the hospital.  You may periodically feel a strong urge to void with the catheter in place.  This is a bladder spasm and most often can occur when having a bowel movement or moving around. It is typically self-limited and usually will stop after a few minutes.  You may use some Vaseline or Neosporin around the tip of the catheter to reduce friction at the tip of the penis. You may also see some blood in the urine.  A very small amount of blood can make the urine look quite red.  As long as the catheter is draining well, there usually is not a problem.  However, if the catheter is not draining well and is bloody, you should call the office [phone]) to notify us.  It is okay to remove the catheter as instructed by the nurses on Friday morning   Post Anesthesia Home Care Instructions  Activity: Get plenty of rest for the remainder of the day. A responsible individual must stay with you for 24 hours following the procedure.  For the next 24 hours, DO NOT: -Drive a car -Paediatric nurse -Drink alcoholic beverages -Take any medication unless instructed by your physician -Make any legal decisions or sign important papers.  Meals: Start with liquid foods such as gelatin or soup. Progress to regular foods as tolerated. Avoid greasy, spicy, heavy foods. If nausea and/or vomiting occur, drink only clear liquids until the nausea and/or vomiting subsides. Call your physician if vomiting continues.  Special  Instructions/Symptoms: Your throat may feel dry or sore from the anesthesia or the breathing tube placed in your throat during surgery. If this causes discomfort, gargle with warm salt water. The discomfort should disappear within 24 hours.

## 2021-10-02 NOTE — H&P (Signed)
H&P ? ?Chief Complaint: Bladder cancer ? ?History of Present Illness: 71 year old male presents for repeat resection (per protocol) for recent high-grade nonmuscle invasive bladder cancer that was resected on 3.6.2023 with placement of gemcitabine. ? ?Past Medical History:  ?Diagnosis Date  ? Anemia   ? Bladder cancer ([place])   ? urologist-- dr Diona Fanti  ? Chronic constipation   ? GAD (generalized anxiety disorder)   ? GERD (gastroesophageal reflux disease)   ? History of atrial fibrillation   ? ED visit in recovery post tympanoplasty [phone] with brief episode atrial fib, per note cardiology recommended pt follow up with pcp  ? 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 hospital admission ([facility]) 04-29-2021 dx orthostatic hypotension, pneumonia, dehydration, generalized weakness, stroke;  per MRI in care everywhere small chronic cortical infarct within left parietal lobe postcenral gyrus  ? History of external beam radiation therapy 2000  ? salvage post prostatectomy  ? History of prostate cancer 2000  ? followed by dr Diona Fanti;   s/p  radical prostatectomy and salvage radiation in 2000  ? Hypertension   ? followed by pcp;;  (hx admission [facility] 2012 had normal nuclear stress test & echo 08-19-2010 in epic) ;;   previously seen by cardiology , dr Bronson Ing, Cassell Clement note in epic 06-15-2017  ? Lower urinary tract symptoms (LUTS)   ? OA (osteoarthritis)   ? Retinal cyst of right eye   ? per pt w/ floaters  ? Strabismic amblyopia of left eye   ? per pt w/ decreased vision , stated 20/ 200  ? Tachycardia   ? Urinary incontinence   ? ? ?Past Surgical History:  ?Procedure Laterality Date  ? CATARACT EXTRACTION W/ INTRAOCULAR LENS IMPLANT Bilateral   ? 2020  ? CYSTOSCOPY W/ RETROGRADES Bilateral 07/22/2021  ? Procedure: CYSTOSCOPY WITH RETROGRADE PYELOGRAM;  Surgeon: Franchot Gallo, MD;  Location: [facility];  Service: Urology;  Laterality: Bilateral;  ? INSERTION  OF MESH N/A 11/23/2013  ? Procedure: INSERTION OF MESH;  Surgeon: Jamesetta So, MD;  Location: [facility];  Service: General;  Laterality: N/A;  ? KNEE ARTHROSCOPY Right   ? unsure date  ? LUMBAR SPINE SURGERY    ? x2  in 48s  ? PERCUTANEOUS PINNING Right   ? age 14;    right foot fracture  ? POSTERIOR CERVICAL LAMINECTOMY    ? 1990s  ? RETROPUBIC PROSTATECTOMY  2000  ? [facility] :   per pt had Right inguinal hernia repair  ? ROTATOR CUFF REPAIR Right 2004  ? STRABISMUS SURGERY Left   ? age 38 and 50  ? THUMB AMPUTATION Left 2003  ? revision of amputation injusry  ? TOTAL KNEE ARTHROPLASTY Left 09/25/2020  ? Procedure: TOTAL KNEE ARTHROPLASTY;  Surgeon: Paralee Cancel, MD;  Location: [facility];  Service: Orthopedics;  Laterality: Left;  70 mins  ? TRANSURETHRAL RESECTION OF BLADDER TUMOR WITH MITOMYCIN-C N/A 07/22/2021  ? Procedure: TRANSURETHRAL RESECTION OF BLADDER TUMOR WITH GEMCITABINE;  Surgeon: Franchot Gallo, MD;  Location: [facility];  Service: Urology;  Laterality: N/A;  ? TYMPANOPLASTY Left 02/22/2018  ? Procedure: TYMPANOPLASTY;  Surgeon: [name], MD;  Location: [place];  Service: ENT;  Laterality: Left;  ? UMBILICAL HERNIA REPAIR N/A 11/23/2013  ? Procedure: UMBILICAL HERNIORRHAPHY;  Surgeon: Jamesetta So, MD;  Location: [facility];  Service: General;  Laterality: N/A;  ? ? ?Home Medications:  ?Allergies as of 10/02/2021   ? ?  Reactions  ? Morphine And Related Anaphylaxis  ? Oxycodone Other (See Comments)  ? Itchy / jumpy (Percocet)  ? ?  ? ?  ?Medication List  ?  ? ? Notice   ?Cannot display discharge medications because the patient has not yet been admitted. ?  ? ? ?Allergies:  ?Allergies  ?Allergen Reactions  ? Morphine And Related Anaphylaxis  ? Oxycodone Other (See Comments)  ?  Itchy / jumpy (Percocet)  ? ? ?Family History  ?Problem Relation Age of Onset  ? Alzheimer's disease Mother   ? Alzheimer's disease Father   ? Prostate cancer Father   ? Alzheimer's disease  Paternal Grandfather   ? Allergic rhinitis Neg Hx   ? Asthma Neg Hx   ? Eczema Neg Hx   ? Immunodeficiency Neg Hx   ? Urticaria Neg Hx   ? ? ?Social History:  reports that he quit smoking about 17 years ago. His smoking use included cigarettes. He has a 5.00 pack-year smoking history. He has never used smokeless tobacco. He reports that he does not drink alcohol and does not use drugs. ? ?ROS: ?A complete review of systems was performed.  All systems are negative except for pertinent findings as noted. ? ?Physical Exam:  ?Vital signs in last 24 hours: ?Ht '5\' 8"'$  (1.727 m)   Wt 95.3 kg   BMI 31.95 kg/m?  ?Constitutional:  Alert and oriented, No acute distress ?Cardiovascular: Regular rate  ?Respiratory: Normal respiratory effort ?Neurologic: Grossly intact, no focal deficits ?Psychiatric: Normal mood and affect ? ?I have reviewed prior pt notes ? ?I have reviewed urinalysis results ? ?I have reviewed prior pathology results ? ? ? ?Impression/Assessment:  ?High-grade nonmuscle invasive bladder cancer, status post initial resection with TURBT on 6 March, 2023. ? ?Plan:  ?Cystoscopy, repeat TURBT, possible gemcitabine administration ? ?

## 2021-10-03 ENCOUNTER — Ambulatory Visit (HOSPITAL_BASED_OUTPATIENT_CLINIC_OR_DEPARTMENT_OTHER): Payer: Medicare Other | Admitting: Anesthesiology

## 2021-10-03 ENCOUNTER — Ambulatory Visit (HOSPITAL_BASED_OUTPATIENT_CLINIC_OR_DEPARTMENT_OTHER)
Admission: RE | Admit: 2021-10-03 | Discharge: 2021-10-03 | Disposition: A | Discharge status: Home / Self Care | Payer: Medicare Other | Attending: Urology | Admitting: Urology

## 2021-10-03 ENCOUNTER — Encounter (HOSPITAL_BASED_OUTPATIENT_CLINIC_OR_DEPARTMENT_OTHER)
Admission: RE | Disposition: A | Discharge status: Home / Self Care | Payer: Self-pay | Source: Home / Self Care | Attending: Urology

## 2021-10-03 ENCOUNTER — Encounter (HOSPITAL_BASED_OUTPATIENT_CLINIC_OR_DEPARTMENT_OTHER): Payer: Self-pay | Admitting: Urology

## 2021-10-03 DIAGNOSIS — Z8673 Personal history of transient ischemic attack (TIA), and cerebral infarction without residual deficits: Secondary | ICD-10-CM | POA: Diagnosis not present

## 2021-10-03 DIAGNOSIS — I4891 Unspecified atrial fibrillation: Secondary | ICD-10-CM | POA: Diagnosis not present

## 2021-10-03 DIAGNOSIS — Z6832 Body mass index (BMI) 32.0-32.9, adult: Secondary | ICD-10-CM | POA: Diagnosis not present

## 2021-10-03 DIAGNOSIS — I10 Essential (primary) hypertension: Secondary | ICD-10-CM | POA: Diagnosis not present

## 2021-10-03 DIAGNOSIS — Z87891 Personal history of nicotine dependence: Secondary | ICD-10-CM | POA: Diagnosis not present

## 2021-10-03 DIAGNOSIS — C679 Malignant neoplasm of bladder, unspecified: Secondary | ICD-10-CM | POA: Diagnosis not present

## 2021-10-03 DIAGNOSIS — N3289 Other specified disorders of bladder: Secondary | ICD-10-CM | POA: Diagnosis not present

## 2021-10-03 DIAGNOSIS — D494 Neoplasm of unspecified behavior of bladder: Secondary | ICD-10-CM | POA: Diagnosis not present

## 2021-10-03 DIAGNOSIS — Z8551 Personal history of malignant neoplasm of bladder: Secondary | ICD-10-CM | POA: Diagnosis not present

## 2021-10-03 DIAGNOSIS — N3090 Cystitis, unspecified without hematuria: Secondary | ICD-10-CM | POA: Diagnosis not present

## 2021-10-03 DIAGNOSIS — E669 Obesity, unspecified: Secondary | ICD-10-CM | POA: Diagnosis not present

## 2021-10-03 DIAGNOSIS — K219 Gastro-esophageal reflux disease without esophagitis: Secondary | ICD-10-CM | POA: Diagnosis not present

## 2021-10-03 DIAGNOSIS — C672 Malignant neoplasm of lateral wall of bladder: Secondary | ICD-10-CM

## 2021-10-03 DIAGNOSIS — F419 Anxiety disorder, unspecified: Secondary | ICD-10-CM | POA: Diagnosis not present

## 2021-10-03 HISTORY — DX: Unspecified urinary incontinence: R32

## 2021-10-03 HISTORY — PX: TRANSURETHRAL RESECTION OF BLADDER TUMOR: SHX2575

## 2021-10-03 LAB — POCT I-STAT, CHEM 8
BUN: 10 mg/dL (ref 8–23)
Calcium, Ion: 1.27 mmol/L (ref 1.15–1.40)
Chloride: 104 mmol/L (ref 98–111)
Creatinine, Ser: 0.8 mg/dL (ref 0.61–1.24)
Glucose, Bld: 105 mg/dL — ABNORMAL HIGH (ref 70–99)
HCT: 47 % (ref 39.0–52.0)
Hemoglobin: 16 g/dL (ref 13.0–17.0)
Potassium: 3.8 mmol/L (ref 3.5–5.1)
Sodium: 141 mmol/L (ref 135–145)
TCO2: 27 mmol/L (ref 22–32)

## 2021-10-03 SURGERY — TURBT (TRANSURETHRAL RESECTION OF BLADDER TUMOR)
Anesthesia: General

## 2021-10-03 MED ORDER — MIDAZOLAM HCL 2 MG/2ML IJ SOLN
INTRAMUSCULAR | Status: AC
  Filled 2021-10-03: qty 2

## 2021-10-03 MED ORDER — ACETAMINOPHEN 500 MG PO TABS
ORAL_TABLET | ORAL | Status: AC
  Filled 2021-10-03: qty 2

## 2021-10-03 MED ORDER — ONDANSETRON HCL 4 MG/2ML IJ SOLN: 4.0000 mg | Freq: Once | INTRAMUSCULAR | Status: DC | PRN

## 2021-10-03 MED ORDER — FENTANYL CITRATE (PF) 100 MCG/2ML IJ SOLN: 25.0000 ug | INTRAMUSCULAR | Status: DC | PRN

## 2021-10-03 MED ORDER — ONDANSETRON HCL 4 MG/2ML IJ SOLN
INTRAMUSCULAR | Status: DC | PRN
  Administered 2021-10-03: 4 mg via INTRAVENOUS

## 2021-10-03 MED ORDER — FENTANYL CITRATE (PF) 100 MCG/2ML IJ SOLN
INTRAMUSCULAR | Status: AC
  Filled 2021-10-03: qty 2

## 2021-10-03 MED ORDER — LACTATED RINGERS IV SOLN: INTRAVENOUS | Status: DC

## 2021-10-03 MED ORDER — DEXAMETHASONE SODIUM PHOSPHATE 4 MG/ML IJ SOLN
INTRAMUSCULAR | Status: DC | PRN
Start: 2021-10-03 — End: 2021-10-03
  Administered 2021-10-03: 8 mg via INTRAVENOUS

## 2021-10-03 MED ORDER — PROPOFOL 10 MG/ML IV BOLUS
INTRAVENOUS | Status: DC | PRN
  Administered 2021-10-03: 150 mg via INTRAVENOUS

## 2021-10-03 MED ORDER — EPHEDRINE SULFATE (PRESSORS) 50 MG/ML IJ SOLN
INTRAMUSCULAR | Status: DC | PRN
  Administered 2021-10-03: 10 mg via INTRAVENOUS

## 2021-10-03 MED ORDER — FENTANYL CITRATE (PF) 100 MCG/2ML IJ SOLN
INTRAMUSCULAR | Status: DC | PRN
Start: 2021-10-03 — End: 2021-10-03
  Administered 2021-10-03: 50 ug via INTRAVENOUS

## 2021-10-03 MED ORDER — SODIUM CHLORIDE 0.9 % IR SOLN
Status: DC | PRN
  Administered 2021-10-03: 3000 mL via INTRAVESICAL

## 2021-10-03 MED ORDER — ACETAMINOPHEN 500 MG PO TABS
1000.0000 mg | ORAL_TABLET | Freq: Once | ORAL | Status: AC
  Administered 2021-10-03: 1000 mg via ORAL

## 2021-10-03 MED ORDER — CEFAZOLIN SODIUM-DEXTROSE 2-4 GM/100ML-% IV SOLN
INTRAVENOUS | Status: AC
  Filled 2021-10-03: qty 100

## 2021-10-03 MED ORDER — LIDOCAINE HCL (CARDIAC) PF 100 MG/5ML IV SOSY
PREFILLED_SYRINGE | INTRAVENOUS | Status: DC | PRN
  Administered 2021-10-03: 50 mg via INTRAVENOUS

## 2021-10-03 MED ORDER — MIDAZOLAM HCL 2 MG/2ML IJ SOLN
INTRAMUSCULAR | Status: DC | PRN
  Administered 2021-10-03: 1 mg via INTRAVENOUS

## 2021-10-03 MED ORDER — CEFAZOLIN SODIUM-DEXTROSE 2-4 GM/100ML-% IV SOLN
2.0000 g | INTRAVENOUS | Status: AC
  Administered 2021-10-03: 2 g via INTRAVENOUS

## 2021-10-03 SURGICAL SUPPLY — 29 items
BAG DRAIN URO-CYSTO SKYTR STRL (DRAIN) ×2 IMPLANT
BAG DRN RND TRDRP ANRFLXCHMBR (UROLOGICAL SUPPLIES)
BAG DRN UROCATH (DRAIN) ×1
BAG URINE DRAIN 2000ML AR STRL (UROLOGICAL SUPPLIES) IMPLANT
BAG URINE LEG 500ML (DRAIN) IMPLANT
CATH FOLEY 2WAY SLVR  5CC 20FR (CATHETERS)
CATH FOLEY 2WAY SLVR  5CC 22FR (CATHETERS)
CATH FOLEY 2WAY SLVR 5CC 20FR (CATHETERS) IMPLANT
CATH FOLEY 2WAY SLVR 5CC 22FR (CATHETERS) IMPLANT
CATH FOLEY 3WAY 30CC 22FR (CATHETERS) IMPLANT
CLOTH BEACON ORANGE TIMEOUT ST (SAFETY) ×2 IMPLANT
ELECT REM PT RETURN 9FT ADLT (ELECTROSURGICAL) ×2
ELECTRODE REM PT RTRN 9FT ADLT (ELECTROSURGICAL) ×1 IMPLANT
EVACUATOR MICROVAS BLADDER (UROLOGICAL SUPPLIES) IMPLANT
GLOVE BIO SURGEON STRL SZ8 (GLOVE) ×2 IMPLANT
GOWN STRL REUS W/TWL XL LVL3 (GOWN DISPOSABLE) ×2 IMPLANT
HOLDER FOLEY CATH W/STRAP (MISCELLANEOUS) IMPLANT
IV NS IRRIG 3000ML ARTHROMATIC (IV SOLUTION) ×2 IMPLANT
KIT TURNOVER CYSTO (KITS) ×2 IMPLANT
LOOP CUT BIPOLAR 24F LRG (ELECTROSURGICAL) ×2 IMPLANT
MANIFOLD NEPTUNE II (INSTRUMENTS) ×2 IMPLANT
NS IRRIG 500ML POUR BTL (IV SOLUTION) ×2 IMPLANT
PACK CYSTO (CUSTOM PROCEDURE TRAY) ×2 IMPLANT
PLUG CATH AND CAP STER (CATHETERS) IMPLANT
SYR TOOMEY IRRIG 70ML (MISCELLANEOUS) ×2
SYRINGE TOOMEY IRRIG 70ML (MISCELLANEOUS) ×1 IMPLANT
TUBE CONNECTING 12X1/4 (SUCTIONS) ×2 IMPLANT
TUBING UROLOGY SET (TUBING) IMPLANT
WATER STERILE IRR 500ML POUR (IV SOLUTION) IMPLANT

## 2021-10-03 NOTE — Interval H&P Note (Signed)
History and Physical Interval Note:  10/03/2021 8:37 AM  Philip Mcneil  has presented today for surgery, with the diagnosis of BLADDER CANCER.  The various methods of treatment have been discussed with the patient and family. After consideration of risks, benefits and other options for treatment, the patient has consented to  Procedure(s) with comments: REPEAT TRANSURETHRAL RESECTION OF BLADDER TUMOR (TURBT) (N/A) - 45 MINS as a surgical intervention.  The patient's history has been reviewed, patient examined, no change in status, stable for surgery.  I have reviewed the patient's chart and labs.  Questions were answered to the patient's satisfaction.     Lillette Boxer Adrianah Prophete

## 2021-10-03 NOTE — Anesthesia Procedure Notes (Signed)
Procedure Name: LMA Insertion Date/Time: 10/03/2021 8:58 AM Performed by: Georgeanne Nim, CRNA Pre-anesthesia Checklist: Patient identified, Emergency Drugs available, Suction available, Patient being monitored and Timeout performed Patient Re-evaluated:Patient Re-evaluated prior to induction Oxygen Delivery Method: Circle system utilized Preoxygenation: Pre-oxygenation with 100% oxygen Induction Type: IV induction Ventilation: Mask ventilation without difficulty LMA: LMA inserted LMA Size: 4.0 Number of attempts: 1 Placement Confirmation: positive ETCO2, CO2 detector and breath sounds checked- equal and bilateral Tube secured with: Tape Dental Injury: Teeth and Oropharynx as per pre-operative assessment

## 2021-10-03 NOTE — Transfer of Care (Signed)
Immediate Anesthesia Transfer of Care Note  Patient: Philip Mcneil  Procedure(s) Performed: REPEAT TRANSURETHRAL RESECTION OF BLADDER TUMOR (TURBT)  Patient Location: PACU  Anesthesia Type:General  Level of Consciousness: awake, alert , oriented and patient cooperative  Airway & Oxygen Therapy: Patient Spontanous Breathing and Patient connected to nasal cannula oxygen  Post-op Assessment: Report given to RN and Post -op Vital signs reviewed and stable  Post vital signs: Reviewed and stable  Last Vitals:  Vitals Value Taken Time  BP 141/83 10/03/21 0933  Temp 36.4 C 10/03/21 0933  Pulse 65 10/03/21 0935  Resp 10 10/03/21 0935  SpO2 96 % 10/03/21 0935  Vitals shown include unvalidated device data.  Last Pain:  Vitals:   10/03/21 0713  TempSrc: Oral  PainSc: 0-No pain      Patients Stated Pain Goal: 5 (35/70/17 7939)  Complications: No notable events documented.

## 2021-10-03 NOTE — Op Note (Signed)
Preoperative diagnosis: History of prior resection for high-grade nonmuscle invasive bladder cancer of right anterior/lateral bladder neck  Postoperative diagnosis: Same  Principal procedure: Cystoscopy, repeat TURBT of right anterior/lateral bladder neck, 3 cm in diameter  Surgeon: Demani Weyrauch  Anesthesia: General with LMA  Specimen: Bladder tumor, prior resection site  Estimated blood loss: Less than 5 mL  Complications: None  Drains: None  Indications: 71 year old male with history of radical prostatectomy with subsequent salvage radiotherapy for recurrent disease.  He underwent TURBT of a right lateral/anterior bladder neck tumor, quite large in March of this year.  He was found to have high-grade nonmuscle invasive bladder cancer.  He presents at this time for repeat resection per protocol.  Findings: Mid urethra was mildly stenotic, and needed to be dilated to 30 [place] for passage of the resectoscope.  Prostate was absent.  Bladder neck mildly contracted.  Mild papillary configuration to some of the urothelium in the prior resection site.  No other abnormalities noted in the bladder.  Description of procedure: The patient was properly identified in the holding area, taken to the operating room where general anesthetic was administered with the LMA.  He is placed in the dorsolithotomy position.  Genitalia and perineum were prepped, draped, proper timeout performed.  41 French visual obturator was passed into the urethra.  Passage through the mid urethra was difficult/impossible.  The urethra was then dilated to 30 [place] with [employer].  The visual obturator was used to pass the resectoscope sheath into the bladder with the above-mentioned findings noted.  The resectoscope and cutting loop were placed.  Prior resection site was resected.  This was an approximate 3 cm diameter area.  Fragments were then removed from the bladder and sent for pathology.  Resection site cauterized.   Adequate hemostasis was noted.  At this point the resectoscope was removed.  85 French Foley catheter was passed, balloon filled with 10 cc of water and hooked to dependent drainage.  At this point the procedure was terminated.  The patient was awakened, taken to the PACU in stable condition, having tolerated the procedure well.

## 2021-10-04 NOTE — Anesthesia Postprocedure Evaluation (Signed)
Anesthesia Post Note  Patient: Philip Mcneil  Procedure(s) Performed: REPEAT TRANSURETHRAL RESECTION OF BLADDER TUMOR (TURBT)     Patient location during evaluation: PACU Anesthesia Type: General Level of consciousness: awake and alert Pain management: pain level controlled Vital Signs Assessment: post-procedure vital signs reviewed and stable Respiratory status: spontaneous breathing, nonlabored ventilation and respiratory function stable Cardiovascular status: stable and blood pressure returned to baseline Anesthetic complications: no   No notable events documented.  Last Vitals:  Vitals:   10/03/21 1000 10/03/21 1102  BP: (!) 151/70 (!) 153/74  Pulse: 63 65  Resp: 14 15  Temp: (!) 36.4 C 36.5 C  SpO2: 99% 95%    Last Pain:  Vitals:   10/03/21 1102  TempSrc:   PainSc: [place]

## 2021-10-05 LAB — SURGICAL PATHOLOGY

## 2021-10-07 ENCOUNTER — Encounter (HOSPITAL_BASED_OUTPATIENT_CLINIC_OR_DEPARTMENT_OTHER): Payer: Self-pay | Admitting: Urology

## 2021-10-15 ENCOUNTER — Telehealth: Payer: Self-pay

## 2021-10-15 NOTE — Telephone Encounter (Signed)
Patient called requesting medication to help with incontinence. Patient states he is doing more than leaking it is a steady flow. Oxybutynin is not working for him please advise.

## 2021-10-17 NOTE — Telephone Encounter (Signed)
Patient will come by to drop off a urine and pick up samples of Myrbetriq to last until his apt with [facility] on 06/13

## 2021-10-18 ENCOUNTER — Other Ambulatory Visit: Payer: Medicare Other

## 2021-10-18 DIAGNOSIS — N3941 Urge incontinence: Secondary | ICD-10-CM

## 2021-10-18 LAB — URINALYSIS, ROUTINE W REFLEX MICROSCOPIC
Bilirubin, UA: NEGATIVE
Glucose, UA: NEGATIVE
Ketones, UA: NEGATIVE
Nitrite, UA: NEGATIVE
Specific Gravity, UA: >1.03 — ABNORMAL HIGH (ref 1.005–1.030)
Urobilinogen, Ur: 0.2 mg/dL (ref 0.2–1.0)
pH, UA: 6.5 (ref 5.0–7.5)

## 2021-10-18 LAB — MICROSCOPIC EXAMINATION
Bacteria, UA: NONE SEEN
Renal Epithel, UA: NONE SEEN /hpf

## 2021-10-18 NOTE — Progress Notes (Signed)
Pt c/o incontinence since TURBT on 5/18. No fever, chills, NV. No pain. No gross hematuria. Dropped urine and was given samples of Myrbetriq to try instead of oxybutynin. UA= 0-5 white cells, 3-10 red cells, no bacteria, nitrite negative No antibiotics indicated at this time.  Discussed with patient who was still in the office.  He conveys understanding and will follow-up as scheduled with Dr. Diona Fanti.

## 2021-10-18 NOTE — Addendum Note (Signed)
Addended by: Audie Box on: 10/18/2021 12:28 PM   Modules accepted: Orders

## 2021-10-24 DIAGNOSIS — Z471 Aftercare following joint replacement surgery: Secondary | ICD-10-CM | POA: Diagnosis not present

## 2021-10-24 DIAGNOSIS — Z96652 Presence of left artificial knee joint: Secondary | ICD-10-CM | POA: Diagnosis not present

## 2021-10-29 ENCOUNTER — Ambulatory Visit (INDEPENDENT_AMBULATORY_CARE_PROVIDER_SITE_OTHER): Payer: Medicare Other | Admitting: Urology

## 2021-10-29 VITALS — BP 140/76 | HR 79 | Ht 67.0 in | Wt 210.0 lb

## 2021-10-29 DIAGNOSIS — N320 Bladder-neck obstruction: Secondary | ICD-10-CM

## 2021-10-29 DIAGNOSIS — Z8551 Personal history of malignant neoplasm of bladder: Secondary | ICD-10-CM | POA: Diagnosis not present

## 2021-10-29 DIAGNOSIS — Z8546 Personal history of malignant neoplasm of prostate: Secondary | ICD-10-CM

## 2021-10-29 DIAGNOSIS — C678 Malignant neoplasm of overlapping sites of bladder: Secondary | ICD-10-CM | POA: Diagnosis not present

## 2021-10-29 DIAGNOSIS — R32 Unspecified urinary incontinence: Secondary | ICD-10-CM | POA: Diagnosis not present

## 2021-10-29 NOTE — Progress Notes (Signed)
History of Present Illness: Philip Mcneil returns for follow-up.  He has a history of urothelial carcinoma the bladder.  3.6.2023: Initial TURBT of a large bladder tumor performed om 3.6.2023.  Tumor location was right anterior/lateral bladder wall--approximately 4 cm in size. Gemcitabine placed postoperatively.   Path--HG NMIBC  5.18.2023: Repeat resection.  Pathology all benign.  6.23.2023: Here for follow-up.  Still with troublesome urinary leakage.  This is been a bother since his first TURBT in March.  Most of his urine comes out in his diaper.  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  He has not had any gross hematuria.   Past Medical History:  Diagnosis Date   Anemia    Bladder cancer [facility])    urologist-- dr Diona Fanti   Chronic constipation    GAD (generalized anxiety disorder)    GERD (gastroesophageal reflux disease)    History of atrial fibrillation    ED visit in recovery post tympanoplasty [phone] with brief episode atrial fib, per note cardiology recommended pt follow up with pcp   History of CVA (cerebrovascular accident) without residual deficits 04/2021   hospital admission ([facility]) 04-29-2021 dx orthostatic hypotension, pneumonia, dehydration, generalized weakness, stroke;  per MRI in care everywhere small chronic cortical infarct within left parietal lobe postcenral gyrus   History of external beam radiation therapy 2000   salvage post prostatectomy   History of prostate cancer 2000   followed by dr Diona Fanti;   s/p  radical prostatectomy and salvage radiation in 2000   Hypertension    followed by pcp;;  (hx admission [facility] 2012 had normal nuclear stress test & echo 08-19-2010 in epic) ;;   previously seen by cardiology , dr Natividad Brood note in epic 06-15-2017   Lower  urinary tract symptoms (LUTS)    OA (osteoarthritis)    Retinal cyst of right eye    per pt w/ floaters   Strabismic amblyopia of left eye    per pt w/ decreased vision , stated 20/ 200   Tachycardia    Urinary incontinence     Past Surgical History:  Procedure Laterality Date   CATARACT EXTRACTION W/ INTRAOCULAR LENS IMPLANT Bilateral    2020   CYSTOSCOPY W/ RETROGRADES Bilateral 07/22/2021   Procedure: CYSTOSCOPY WITH RETROGRADE PYELOGRAM;  Surgeon: Franchot Gallo, MD;  Location: [facility];  Service: Urology;  Laterality: Bilateral;   INSERTION OF MESH N/A 11/23/2013   Procedure: INSERTION OF MESH;  Surgeon: Jamesetta So, MD;  Location: [facility];  Service: General;  Laterality: N/A;   KNEE ARTHROSCOPY Right    unsure date   LUMBAR SPINE SURGERY     x2  in [place] Right    age 71;    right foot fracture   POSTERIOR CERVICAL LAMINECTOMY     1990s   RETROPUBIC PROSTATECTOMY  2000   '@[facility]'$ :   per pt had Right inguinal hernia repair   ROTATOR CUFF REPAIR Right 2004   STRABISMUS SURGERY Left    age 71 and 36   THUMB AMPUTATION Left 2003   revision of amputation injusry   TOTAL KNEE ARTHROPLASTY Left 09/25/2020   Procedure: TOTAL KNEE ARTHROPLASTY;  Surgeon: Paralee Cancel, MD;  Location: [facility];  Service: Orthopedics;  Laterality: Left;  70 mins   TRANSURETHRAL RESECTION OF BLADDER  TUMOR N/A 10/03/2021   Procedure: REPEAT TRANSURETHRAL RESECTION OF BLADDER TUMOR (TURBT);  Surgeon: Franchot Gallo, MD;  Location: [facility];  Service: Urology;  Laterality: N/A;  [place] TUMOR WITH MITOMYCIN-C N/A 07/22/2021   Procedure: TRANSURETHRAL RESECTION OF BLADDER TUMOR WITH GEMCITABINE;  Surgeon: Franchot Gallo, MD;  Location: [facility];  Service: Urology;  Laterality: N/A;   TYMPANOPLASTY Left 02/22/2018   Procedure: TYMPANOPLASTY;  Surgeon: [name], MD;  Location: [place];  Service: ENT;  Laterality: Left;   UMBILICAL HERNIA REPAIR N/A 11/23/2013   Procedure: UMBILICAL HERNIORRHAPHY;  Surgeon: Jamesetta So, MD;  Location: [facility];  Service: General;  Laterality: N/A;    Home Medications:  Allergies as of 10/29/2021       Reactions   Morphine And Related Anaphylaxis   Oxycodone Other (See Comments)   Itchy / jumpy (Percocet)        Medication List        Accurate as of October 29, 2021  8:14 AM. If you have any questions, ask your nurse or doctor.          acetaminophen 500 MG tablet Commonly known as: TYLENOL Take 1,000 mg by mouth as needed for mild pain.   ALPRAZolam 0.5 MG tablet Commonly known as: XANAX Take 0.5 mg by mouth 3 (three) times daily as needed for anxiety.   losartan 25 MG tablet Commonly known as: COZAAR Take 25 mg by mouth daily.   Loteprednol Etabonate 0.5 % Gel Place 1 drop into the right eye daily.   oxybutynin 5 MG tablet Commonly known as: DITROPAN Take 5 mg by mouth every 8 (eight) hours as needed for bladder spasms.   pantoprazole 40 MG tablet Commonly known as: PROTONIX Take 40 mg by mouth daily.   Prolensa 0.07 % Soln Generic drug: Bromfenac Sodium Place 1 drop into the right eye at bedtime.        Allergies:  Allergies  Allergen Reactions   Morphine And Related Anaphylaxis   Oxycodone Other (See Comments)    Itchy / jumpy (Percocet)    Family History  Problem Relation Age of Onset   Alzheimer's disease Mother    Alzheimer's disease Father    Prostate cancer Father    Alzheimer's disease Paternal Grandfather    Allergic rhinitis Neg Hx    Asthma Neg Hx    Eczema Neg Hx    Immunodeficiency Neg Hx    Urticaria Neg Hx     Social History:  reports that he quit smoking about 17 years ago. His smoking use included cigarettes. He has a 5.00 pack-year smoking history. He has never used smokeless tobacco. He reports that he does not drink alcohol and does not use  drugs.  ROS: A complete review of systems was performed.  All systems are negative except for pertinent findings as noted.  Physical Exam:  Vital signs in last 24 hours: There were no vitals taken for this visit. Constitutional:  Alert and oriented, No acute distress Cardiovascular: Regular rate  Respiratory: Normal respiratory effort Neurologic: Grossly intact, no focal deficits Psychiatric: Normal mood and affect  I have reviewed prior pt notes  I have reviewed urinalysis results  I have reviewed prior pathology results  Bladder scan today revealed about 100 mL residual    Impression/Assessment:  1.  History of prostate cancer status post radical prostatectomy and salvage radiotherapy with no evidence of recurrence  2.  High-grade nonmuscle invasive bladder cancer, status post initial resection in March, repeat resection in May with the second biopsy showing benign findings  3.  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  I did discuss with Kyrese the fact that I do not think his leakage is going to get much better, unfortunately unless the stricture recurs  2.  I gave him more samples of Myrbetriq only 50 mg now  3.  I will bring him in for 6 weekly BCG treatments for induction therapy.  We will have this done in the office and have him lie down in our office afterwards so there is not significant leakage  4.  I will see him back in 3 months for cystoscopy

## 2021-10-30 LAB — URINALYSIS, ROUTINE W REFLEX MICROSCOPIC
Bilirubin, UA: NEGATIVE
Glucose, UA: NEGATIVE
Ketones, UA: NEGATIVE
Nitrite, UA: NEGATIVE
Protein,UA: NEGATIVE
Specific Gravity, UA: 1.015 (ref 1.005–1.030)
Urobilinogen, Ur: 0.2 mg/dL (ref 0.2–1.0)
pH, UA: 7 (ref 5.0–7.5)

## 2021-10-30 LAB — MICROSCOPIC EXAMINATION: Bacteria, UA: NONE SEEN

## 2021-11-12 ENCOUNTER — Ambulatory Visit (INDEPENDENT_AMBULATORY_CARE_PROVIDER_SITE_OTHER): Payer: Medicare Other | Admitting: Physician Assistant

## 2021-11-12 VITALS — BP 139/90 | HR 76 | Ht 67.0 in | Wt 210.0 lb

## 2021-11-12 DIAGNOSIS — C678 Malignant neoplasm of overlapping sites of bladder: Secondary | ICD-10-CM

## 2021-11-12 MED ORDER — BCG LIVE 50 MG IS SUSR
3.2400 mL | Freq: Once | INTRAVESICAL | Status: AC
  Administered 2021-11-12: 81 mg via INTRAVESICAL

## 2021-11-12 NOTE — Progress Notes (Signed)
BCG Bladder Instillation  BCG # 1 of 6  Due to Bladder Cancer patient is present today for a BCG treatment. Patient was cleaned and prepped in a sterile fashion with betadine. A 16FR catheter was inserted, urine return was noted 22m, urine was yellow in color.  592mof reconstituted BCG was instilled into the bladder. The catheter was then plugged.  Patient will remain in office for the 2 hour procedure.  Patient tolerated well, no complications were noted  Performed by: KoLevi AlandCMA  Follow up/ Additional notes: Follow up in 1 week for next treatment.  .Marland Kitchen

## 2021-11-13 LAB — URINALYSIS, ROUTINE W REFLEX MICROSCOPIC
Bilirubin, UA: NEGATIVE
Glucose, UA: NEGATIVE
Ketones, UA: NEGATIVE
Nitrite, UA: NEGATIVE
Specific Gravity, UA: 1.015 (ref 1.005–1.030)
Urobilinogen, Ur: 1 mg/dL (ref 0.2–1.0)
pH, UA: 7 (ref 5.0–7.5)

## 2021-11-13 LAB — MICROSCOPIC EXAMINATION: Epithelial Cells (non renal): NONE SEEN /hpf (ref 0–10)

## 2021-11-14 NOTE — Progress Notes (Signed)
Assessment: 1. Malignant neoplasm of overlapping sites of bladder (HCC) - Urinalysis, Routine w reflex microscopic - bcg vaccine injection 81 mg    Plan: Verbal and written information given to the patient concerning what to expect after treatments.  Follow-up in 1 week for UA and BCG treatment 2/6.  Chief Complaint: No chief complaint on file.   HPI: Philip Mcneil is a 71 y.o. male who presents for continued evaluation of urothelial carcinoma and scheduled for BCG treatments to begin.  Today is treatment 1/6.  He has no complaints today and denies gross hematuria, dysuria, frequency, burning, urgency or incontinence.  Urinalysis is nitrite negative with 11-30 WBCs, 11-30 RBCs, few bacteria.  10/29/21 Philip Mcneil returns for follow-up.  He has a history of urothelial carcinoma the bladder.  3.6.2023: Initial TURBT of a large bladder tumor performed om 3.6.2023.  Tumor location was right anterior/lateral bladder wall--approximately 4 cm in size. Gemcitabine placed postoperatively.   Path--HG NMIBC   5.18.2023: Repeat resection.  Pathology all benign.  6.23.2023: Here for follow-up.  Still with troublesome urinary leakage.  This is been a bother since his first TURBT in March.  Most of his urine comes out in his diaper.  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  He has not had any gross hematuria.   Portions of the above documentation were copied from a prior visit for review purposes only.  Allergies: Allergies  Allergen Reactions   Morphine And Related Anaphylaxis   Oxycodone Other (See Comments)    Itchy / jumpy (Percocet)    PMH: Past Medical History:  Diagnosis Date   Anemia    Bladder cancer [facility])    urologist-- dr Diona Fanti   Chronic constipation    GAD (generalized anxiety  disorder)    GERD (gastroesophageal reflux disease)    History of atrial fibrillation    ED visit in recovery post tympanoplasty [phone] with brief episode atrial fib, per note cardiology recommended pt follow up with pcp   History of CVA (cerebrovascular accident) without residual deficits 04/2021   hospital admission ([facility]) 04-29-2021 dx orthostatic hypotension, pneumonia, dehydration, generalized weakness, stroke;  per MRI in care everywhere small chronic cortical infarct within left parietal lobe postcenral gyrus   History of external beam radiation therapy 2000   salvage post prostatectomy   History of prostate cancer 2000   followed by dr Diona Fanti;   s/p  radical prostatectomy and salvage radiation in 2000   Hypertension    followed by pcp;;  (hx admission [facility] 2012 had normal nuclear stress test & echo 08-19-2010 in epic) ;;   previously seen by cardiology , dr Bronson Ing, Cassell Clement note in epic 06-15-2017   Lower urinary tract symptoms (LUTS)    OA (osteoarthritis)    Retinal cyst of right eye    per pt w/ floaters   Strabismic amblyopia of left eye    per pt w/ decreased vision , stated 20/ 200   Tachycardia    Urinary incontinence     PSH: Past Surgical History:  Procedure Laterality Date   CATARACT EXTRACTION W/ INTRAOCULAR LENS IMPLANT Bilateral    2020   CYSTOSCOPY W/ RETROGRADES Bilateral 07/22/2021   Procedure: CYSTOSCOPY WITH RETROGRADE PYELOGRAM;  Surgeon: Franchot Gallo, MD;  Location: [facility];  Service: Urology;  Laterality: Bilateral;  INSERTION OF MESH N/A 11/23/2013   Procedure: INSERTION OF MESH;  Surgeon: Jamesetta So, MD;  [facility];  Service: General;  Laterality: N/A;   KNEE ARTHROSCOPY Right    unsure date   LUMBAR SPINE SURGERY     x2  in [place] Right    age 89;    right foot fracture   POSTERIOR CERVICAL LAMINECTOMY     1990s   RETROPUBIC PROSTATECTOMY  2000   '@[facility]'$ :   per pt had Right  inguinal hernia repair   ROTATOR CUFF REPAIR Right 2004   STRABISMUS SURGERY Left    age 61 and 37   THUMB AMPUTATION Left 2003   revision of amputation injusry   TOTAL KNEE ARTHROPLASTY Left 09/25/2020   Procedure: TOTAL KNEE ARTHROPLASTY;  Surgeon: Paralee Cancel, MD;  [facility];  Service: Orthopedics;  Laterality: Left;  70 mins   TRANSURETHRAL RESECTION OF BLADDER TUMOR N/A 10/03/2021   Procedure: REPEAT TRANSURETHRAL RESECTION OF BLADDER TUMOR (TURBT);  Surgeon: Franchot Gallo, MD;  Location: [facility];  Service: Urology;  Laterality: N/A;  [place] TUMOR WITH MITOMYCIN-C N/A 07/22/2021   Procedure: TRANSURETHRAL RESECTION OF BLADDER TUMOR WITH GEMCITABINE;  Surgeon: Franchot Gallo, MD;  Location: [facility];  Service: Urology;  Laterality: N/A;   TYMPANOPLASTY Left 02/22/2018   Procedure: TYMPANOPLASTY;  Surgeon: [name], MD;  Location: [place];  Service: ENT;  Laterality: Left;   UMBILICAL HERNIA REPAIR N/A 11/23/2013   Procedure: UMBILICAL HERNIORRHAPHY;  Surgeon: Jamesetta So, MD;  [facility];  Service: General;  Laterality: N/A;    SH: Social History   Tobacco Use   Smoking status: Former    Packs/day: 0.25    Years: 20.00    Total pack years: 5.00    Types: Cigarettes    Quit date: 11/18/2003    Years since quitting: 18.0   Smokeless tobacco: Never  Vaping Use   Vaping Use: Every day   Substances: Nicotine   Devices: juul  Substance Use Topics   Alcohol use: No    Alcohol/week: 0.0 standard drinks of alcohol   Drug use: No    ROS: All other review of systems were reviewed and are negative except what is noted above in HPI  PE: BP 139/90   Pulse 76   Ht '5\' 7"'$  (1.702 m)   Wt 210 lb (95.3 kg)   BMI 32.89 kg/m  GENERAL APPEARANCE:  Well appearing, well developed, well nourished, NAD HEENT:  Atraumatic, normocephalic NECK:  Supple. Trachea  midline ABDOMEN:  Soft, non-tender, no masses EXTREMITIES:  Moves all extremities well, without clubbing, cyanosis, or edema NEUROLOGIC:  Alert and oriented x 3 MENTAL STATUS:  appropriate SKIN:  Warm, dry, and intact   Results: Laboratory Data: Lab Results  Component Value Date   WBC 4.3 04/11/2021   HGB 16.0 10/03/2021   HCT 47.0 10/03/2021   MCV 88.7 04/11/2021   PLT 245 04/11/2021    Lab Results  Component Value Date   CREATININE 0.80 10/03/2021    No results found for: "PSA"  No results found for: "TESTOSTERONE"  No results found for: "HGBA1C"  Urinalysis    Component Value Date/Time   COLORURINE RED (A) 04/11/2021 0757   APPEARANCEUR Clear 11/12/2021 1214   LABSPEC 1.003 (L) 04/11/2021 0757   PHURINE 7.0 04/11/2021 0757   GLUCOSEU Negative 11/12/2021 1214   HGBUR MODERATE (  A) 04/11/2021 0757   BILIRUBINUR Negative 11/12/2021 [place] 04/11/2021 0757   PROTEINUR 1+ (A) 11/12/2021 1214   PROTEINUR 100 (A) 04/11/2021 0757   NITRITE Negative 11/12/2021 1214   NITRITE NEGATIVE 04/11/2021 0757   LEUKOCYTESUR 1+ (A) 11/12/2021 1214   LEUKOCYTESUR NEGATIVE 04/11/2021 0757    Lab Results  Component Value Date   LABMICR See below: 11/12/2021   WBCUA 11-30 (A) 11/12/2021   LABEPIT None seen 11/12/2021   MUCUS Present (A) 11/12/2021   BACTERIA Few (A) 11/12/2021    Pertinent Imaging: No results found for this or any previous visit.  No results found for this or any previous visit.  No results found for this or any previous visit.  No results found for this or any previous visit.  No results found for this or any previous visit.  No results found for this or any previous visit.  No results found for this or any previous visit.  No results found for this or any previous visit.  No results found for this or any previous visit (from the past 24 hour(s)).

## 2021-11-21 ENCOUNTER — Ambulatory Visit: Payer: Medicare Other | Admitting: Physician Assistant

## 2021-11-22 ENCOUNTER — Ambulatory Visit (INDEPENDENT_AMBULATORY_CARE_PROVIDER_SITE_OTHER): Payer: Medicare Other | Admitting: Physician Assistant

## 2021-11-22 VITALS — BP 142/86 | HR 101 | Ht 67.0 in | Wt 210.0 lb

## 2021-11-22 DIAGNOSIS — C678 Malignant neoplasm of overlapping sites of bladder: Secondary | ICD-10-CM

## 2021-11-22 DIAGNOSIS — R8271 Bacteriuria: Secondary | ICD-10-CM | POA: Diagnosis not present

## 2021-11-22 LAB — MICROSCOPIC EXAMINATION: Renal Epithel, UA: NONE SEEN /hpf

## 2021-11-22 LAB — URINALYSIS, ROUTINE W REFLEX MICROSCOPIC
Bilirubin, UA: NEGATIVE
Glucose, UA: NEGATIVE
Ketones, UA: NEGATIVE
Nitrite, UA: NEGATIVE
Specific Gravity, UA: 1.02 (ref 1.005–1.030)
Urobilinogen, Ur: 1 mg/dL (ref 0.2–1.0)
pH, UA: 7 (ref 5.0–7.5)

## 2021-11-22 MED ORDER — BCG LIVE 50 MG IS SUSR
3.2400 mL | Freq: Once | INTRAVESICAL | Status: AC
  Administered 2021-11-22: 81 mg via INTRAVESICAL

## 2021-11-22 NOTE — Progress Notes (Signed)
Assessment: 1. Malignant neoplasm of overlapping sites of bladder (HCC) - bcg vaccine injection 81 mg - Urinalysis, Routine w reflex microscopic    Plan: FU next week for BCG tx 3/6. Discussed expectations post tx today and pt will let us know if he has concerns before next week. Urine cx ordered due to amount of bacteria noted on microscopic.  The patient was nitrite negative and no treatment initiated as he is asymptomatic.  We will prescribe antibiotics if indicated by urine culture results.  Chief Complaint: No chief complaint on file.   HPI: Philip Mcneil is a 71 y.o. male who presents for continued evaluation of bladder cancer. He is scheduled for BCG tx 2/6 today and has tolerated treatments well.  He had some gross hematuria initially after last treatment, but it cleared.  UA= 6-10 WBCs, 11-30 RBCs, many bacteria, nitrite negative   Portions of the above documentation were copied from a prior visit for review purposes only.  Allergies: Allergies  Allergen Reactions   Morphine And Related Anaphylaxis   Oxycodone Other (See Comments)    Itchy / jumpy (Percocet)    PMH: Past Medical History:  Diagnosis Date   Anemia    Bladder cancer [facility])    urologist-- dr Diona Fanti   Chronic constipation    GAD (generalized anxiety disorder)    GERD (gastroesophageal reflux disease)    History of atrial fibrillation    ED visit in recovery post tympanoplasty [phone] with brief episode atrial fib, per note cardiology recommended pt follow up with pcp   History of CVA (cerebrovascular accident) without residual deficits 04/2021   hospital admission ([facility]) 04-29-2021 dx orthostatic hypotension, pneumonia, dehydration, generalized weakness, stroke;  per MRI in care everywhere small chronic cortical infarct within left parietal lobe postcenral gyrus   History of external beam radiation therapy 2000   salvage post prostatectomy   History of prostate cancer 2000   followed by  dr Diona Fanti;   s/p  radical prostatectomy and salvage radiation in 2000   Hypertension    followed by pcp;;  (hx admission [facility] 2012 had normal nuclear stress test & echo 08-19-2010 in epic) ;;   previously seen by cardiology , dr Bronson Ing, Cassell Clement note in epic 06-15-2017   Lower urinary tract symptoms (LUTS)    OA (osteoarthritis)    Retinal cyst of right eye    per pt w/ floaters   Strabismic amblyopia of left eye    per pt w/ decreased vision , stated 20/ 200   Tachycardia    Urinary incontinence     PSH: Past Surgical History:  Procedure Laterality Date   CATARACT EXTRACTION W/ INTRAOCULAR LENS IMPLANT Bilateral    2020   CYSTOSCOPY W/ RETROGRADES Bilateral 07/22/2021   Procedure: CYSTOSCOPY WITH RETROGRADE PYELOGRAM;  Surgeon: Franchot Gallo, MD;  [facility];  Service: Urology;  Laterality: Bilateral;   INSERTION OF MESH N/A 11/23/2013   Procedure: INSERTION OF MESH;  Surgeon: Jamesetta So, MD;  Location[facility];  Service: General;  Laterality: N/A;   KNEE ARTHROSCOPY Right    unsure date   LUMBAR SPINE SURGERY     x2  in [place] Right    age 46;    right foot fracture   POSTERIOR CERVICAL LAMINECTOMY     1990s   RETROPUBIC PROSTATECTOMY  2000   '@[facility]'$ :   per pt had Right inguinal hernia repair   ROTATOR CUFF REPAIR Right 2004  STRABISMUS SURGERY Left    age 27 and 65   THUMB AMPUTATION Left 2003   revision of amputation injusry   TOTAL KNEE ARTHROPLASTY Left 09/25/2020   Procedure: TOTAL KNEE ARTHROPLASTY;  Surgeon: Paralee Cancel, MD;  [facility];  Service: Orthopedics;  Laterality: Left;  70 mins   TRANSURETHRAL RESECTION OF BLADDER TUMOR N/A 10/03/2021   Procedure: REPEAT TRANSURETHRAL RESECTION OF BLADDER TUMOR (TURBT);  Surgeon: Franchot Gallo, MD;  Location: [place];  Service: Urology;  Laterality: N/A;  [place] TUMOR WITH MITOMYCIN-C N/A 07/22/2021    Procedure: TRANSURETHRAL RESECTION OF BLADDER TUMOR WITH GEMCITABINE;  Surgeon: Franchot Gallo, MD;  Location: [facility];  Service: Urology;  Laterality: N/A;   TYMPANOPLASTY Left 02/22/2018   Procedure: TYMPANOPLASTY;  Surgeon: [name], MD;  Location: [place];  Service: ENT;  Laterality: Left;   UMBILICAL HERNIA REPAIR N/A 11/23/2013   Procedure: UMBILICAL HERNIORRHAPHY;  Surgeon: Jamesetta So, MD;  Location: [facility];  Service: General;  Laterality: N/A;    SH: Social History   Tobacco Use   Smoking status: Former    Packs/day: 0.25    Years: 20.00    Total pack years: 5.00    Types: Cigarettes    Quit date: 11/18/2003    Years since quitting: 18.0   Smokeless tobacco: Never  Vaping Use   Vaping Use: Every day   Substances: Nicotine   Devices: juul  Substance Use Topics   Alcohol use: No    Alcohol/week: 0.0 standard drinks of alcohol   Drug use: No    ROS: All other review of systems were reviewed and are negative except what is noted above in HPI  PE: BP (!) 142/86   Pulse (!) 101   Ht '5\' 7"'$  (1.702 m)   Wt 210 lb (95.3 kg)   BMI 32.89 kg/m  GENERAL APPEARANCE:  Well appearing, well developed, well nourished, NAD HEENT:  Atraumatic, normocephalic NECK:  Supple. Trachea midline Lungs: Unlabored respirations ABDOMEN:  Soft, non-tender, no masses  Results:   Urinalysis    Component Value Date/Time   COLORURINE RED (A) 04/11/2021 0757   APPEARANCEUR Clear 11/12/2021 1214   LABSPEC 1.003 (L) 04/11/2021 0757   PHURINE 7.0 04/11/2021 0757   GLUCOSEU Negative 11/12/2021 1214   HGBUR MODERATE (A) 04/11/2021 0757   BILIRUBINUR Negative 11/12/2021 1214   KETONESUR NEGATIVE 04/11/2021 0757   PROTEINUR 1+ (A) 11/12/2021 1214   PROTEINUR 100 (A) 04/11/2021 0757   NITRITE Negative 11/12/2021 1214   NITRITE NEGATIVE 04/11/2021 0757   LEUKOCYTESUR 1+ (A) 11/12/2021 1214   LEUKOCYTESUR NEGATIVE 04/11/2021 0757    Lab  Results  Component Value Date   LABMICR See below: 11/12/2021   WBCUA 11-30 (A) 11/12/2021   LABEPIT None seen 11/12/2021   MUCUS Present (A) 11/12/2021   BACTERIA Few (A) 11/12/2021    Pertinent Imaging: No results found for this or any previous visit.  No results found for this or any previous visit.  No results found for this or any previous visit.  No results found for this or any previous visit.  No results found for this or any previous visit.  No results found for this or any previous visit.  No results found for this or any previous visit.  No results found for this or any previous visit.  No results found for this or any previous visit (from the past 24 hour(s)).

## 2021-11-22 NOTE — Progress Notes (Signed)
BCG Bladder Instillation  BCG # 2 of 6  Due to Bladder Cancer patient is present today for a BCG treatment. Patient was cleaned and prepped in a sterile fashion with betadine. A 16FR catheter was inserted, urine return was noted 8m, urine was yellow in color.  533mof reconstituted BCG was instilled into the bladder. The catheter was then plugged, patient will remain in office for the 2 hour procedure.  Patient tolerated well, no complications were noted  Performed by: KoLevi AlandCMA  Follow up/ Additional notes: Follow up as scheduled.

## 2021-11-25 ENCOUNTER — Telehealth: Payer: Self-pay

## 2021-11-25 LAB — URINE CULTURE

## 2021-11-25 NOTE — Telephone Encounter (Signed)
-----   Message from [place], [place] sent at 11/25/2021  9:15 AM EDT ----- Please let pt know his urine cx is negative for significant growth of colonies and no antibx is needed. ----- Message ----- From: Lavone Neri Lab Results In Sent: 11/22/2021   1:55 PM EDT To: Reynaldo Minium, PA-C

## 2021-11-25 NOTE — Telephone Encounter (Signed)
Patient called and made aware.

## 2021-11-28 ENCOUNTER — Ambulatory Visit (INDEPENDENT_AMBULATORY_CARE_PROVIDER_SITE_OTHER): Payer: Medicare Other | Admitting: Physician Assistant

## 2021-11-28 VITALS — BP 116/70 | HR 91

## 2021-11-28 DIAGNOSIS — R32 Unspecified urinary incontinence: Secondary | ICD-10-CM | POA: Diagnosis not present

## 2021-11-28 DIAGNOSIS — C678 Malignant neoplasm of overlapping sites of bladder: Secondary | ICD-10-CM

## 2021-11-28 LAB — URINALYSIS, ROUTINE W REFLEX MICROSCOPIC
Bilirubin, UA: NEGATIVE
Glucose, UA: NEGATIVE
Ketones, UA: NEGATIVE
Nitrite, UA: NEGATIVE
Specific Gravity, UA: 1.02 (ref 1.005–1.030)
Urobilinogen, Ur: 0.2 mg/dL (ref 0.2–1.0)
pH, UA: 7 (ref 5.0–7.5)

## 2021-11-28 MED ORDER — BCG LIVE 50 MG IS SUSR
3.2400 mL | Freq: Once | INTRAVESICAL | Status: AC
  Administered 2021-11-28: 81 mg via INTRAVESICAL

## 2021-11-28 NOTE — Patient Instructions (Signed)

## 2021-11-28 NOTE — Progress Notes (Signed)
Assessment: 1. Malignant neoplasm of overlapping sites of bladder (HCC) - bcg vaccine injection 81 mg - Urinalysis, Routine w reflex microscopic  2. Urinary incontinence, unspecified type    Plan: FU next week for BCG tx 4/6. Discussed expectations post tx today and pt will let us know if he has concerns before next week. Will continue to monitor incontinence sxs ongoing since prostatectomy  Chief Complaint: No chief complaint on file.   HPI: Philip Mcneil is a 71 y.o. male who presents for continued evaluation of bladder cancer. He is scheduled for BCG tx 3/6 today and continues to tolerate treatments well. No gross hematuria. States no SEs at all since last tx. Cx last week grew insignificant growth. Pt continues to have incontinence, but sxs no worse than pre BCG txs. Wearing 4-5 diapers/day  UA=1+ Leukoesterace, nitrite negative, 2+protein   Portions of the above documentation were copied from a prior visit for review purposes only.  Allergies: Allergies  Allergen Reactions   Morphine And Related Anaphylaxis   Oxycodone Other (See Comments)    Itchy / jumpy (Percocet)    PMH: Past Medical History:  Diagnosis Date   Anemia    Bladder cancer [facility])    urologist-- dr Diona Fanti   Chronic constipation    GAD (generalized anxiety disorder)    GERD (gastroesophageal reflux disease)    History of atrial fibrillation    ED visit in recovery post tympanoplasty [phone] with brief episode atrial fib, per note cardiology recommended pt follow up with pcp   History of CVA (cerebrovascular accident) without residual deficits 04/2021   hospital admission ([facility]) 04-29-2021 dx orthostatic hypotension, pneumonia, dehydration, generalized weakness, stroke;  per MRI in care everywhere small chronic cortical infarct within left parietal lobe postcenral gyrus   History of external beam radiation therapy 2000   salvage post prostatectomy   History of prostate cancer 2000    followed by dr Diona Fanti;   s/p  radical prostatectomy and salvage radiation in 2000   Hypertension    followed by pcp;;  (hx admission [facility] 2012 had normal nuclear stress test & echo 08-19-2010 in epic) ;;   previously seen by cardiology , dr Bronson Ing, Cassell Clement note in epic 06-15-2017   Lower urinary tract symptoms (LUTS)    OA (osteoarthritis)    Retinal cyst of right eye    per pt w/ floaters   Strabismic amblyopia of left eye    per pt w/ decreased vision , stated 20/ 200   Tachycardia    Urinary incontinence     PSH: Past Surgical History:  Procedure Laterality Date   CATARACT EXTRACTION W/ INTRAOCULAR LENS IMPLANT Bilateral    2020   CYSTOSCOPY W/ RETROGRADES Bilateral 07/22/2021   Procedure: CYSTOSCOPY WITH RETROGRADE PYELOGRAM;  Surgeon: Franchot Gallo, MD;  Location: [facility];  Service: Urology;  Laterality: Bilateral;   INSERTION OF MESH N/A 11/23/2013   Procedure: INSERTION OF MESH;  Surgeon: Jamesetta So, MD;  Location: [facility];  Service: General;  Laterality: N/A;   KNEE ARTHROSCOPY Right    unsure date   LUMBAR SPINE SURGERY     x2  in [place] Right    age 57;    right foot fracture   POSTERIOR CERVICAL LAMINECTOMY     1990s   RETROPUBIC PROSTATECTOMY  2000   '@[facility]'$ :   per pt had Right inguinal hernia repair   ROTATOR CUFF REPAIR Right 2004   STRABISMUS SURGERY Left  age 81 and 17   THUMB AMPUTATION Left 2003   revision of amputation injusry   TOTAL KNEE ARTHROPLASTY Left 09/25/2020   Procedure: TOTAL KNEE ARTHROPLASTY;  Surgeon: Paralee Cancel, MD;  Location: [facility];  Service: Orthopedics;  Laterality: Left;  70 mins   TRANSURETHRAL RESECTION OF BLADDER TUMOR N/A 10/03/2021   Procedure: REPEAT TRANSURETHRAL RESECTION OF BLADDER TUMOR (TURBT);  Surgeon: Franchot Gallo, MD;  Location: [facility];  Service: Urology;  Laterality: N/A;  [place] TUMOR WITH MITOMYCIN-C N/A  07/22/2021   Procedure: TRANSURETHRAL RESECTION OF BLADDER TUMOR WITH GEMCITABINE;  Surgeon: Franchot Gallo, MD;  Location: [facility];  Service: Urology;  Laterality: N/A;   TYMPANOPLASTY Left 02/22/2018   Procedure: TYMPANOPLASTY;  Surgeon: [name], MD;  Location: [place];  Service: ENT;  Laterality: Left;   UMBILICAL HERNIA REPAIR N/A 11/23/2013   Procedure: UMBILICAL HERNIORRHAPHY;  Surgeon: Jamesetta So, MD;  Location: [facility];  Service: General;  Laterality: N/A;    SH: Social History   Tobacco Use   Smoking status: Former    Packs/day: 0.25    Years: 20.00    Total pack years: 5.00    Types: Cigarettes    Quit date: 11/18/2003    Years since quitting: 18.0   Smokeless tobacco: Never  Vaping Use   Vaping Use: Every day   Substances: Nicotine   Devices: juul  Substance Use Topics   Alcohol use: No    Alcohol/week: 0.0 standard drinks of alcohol   Drug use: No    ROS: All other review of systems were reviewed and are negative except what is noted above in HPI  PE: BP 116/70   Pulse 91  GENERAL APPEARANCE:  Well appearing, well developed, well nourished, NAD HEENT:  Atraumatic, normocephalic NECK:  Supple. Trachea midline Lungs: Unlabored respirations ABDOMEN:  Soft, non-tender, no masses  Results:   Urinalysis    Component Value Date/Time   COLORURINE RED (A) 04/11/2021 0757   APPEARANCEUR Clear 11/22/2021 1135   LABSPEC 1.003 (L) 04/11/2021 0757   PHURINE 7.0 04/11/2021 0757   GLUCOSEU Negative 11/22/2021 1135   HGBUR MODERATE (A) 04/11/2021 0757   BILIRUBINUR Negative 11/22/2021 1135   KETONESUR NEGATIVE 04/11/2021 0757   PROTEINUR Trace (A) 11/22/2021 1135   PROTEINUR 100 (A) 04/11/2021 0757   NITRITE Negative 11/22/2021 1135   NITRITE NEGATIVE 04/11/2021 0757   LEUKOCYTESUR Trace (A) 11/22/2021 1135   LEUKOCYTESUR NEGATIVE 04/11/2021 0757    Lab Results  Component Value Date   LABMICR See below:  11/22/2021   WBCUA 6-10 (A) 11/22/2021   LABEPIT 0-10 11/22/2021   MUCUS Present 11/22/2021   BACTERIA Many (A) 11/22/2021    Pertinent Imaging: No results found for this or any previous visit.  No results found for this or any previous visit.  No results found for this or any previous visit.  No results found for this or any previous visit.  No results found for this or any previous visit.  No results found for this or any previous visit.  No results found for this or any previous visit.  No results found for this or any previous visit.  No results found for this or any previous visit (from the past 24 hour(s)).

## 2021-11-28 NOTE — Progress Notes (Addendum)
BCG Bladder Instillation  BCG # 3 of 6  Due to Bladder Cancer patient is present today for a BCG treatment. Patient was cleaned and prepped in a sterile fashion with betadine. A 16FR catheter was inserted, urine return was noted 30m, urine was 20 in color.  542mof reconstituted BCG was instilled into the bladder. The catheter was then removed. Patient tolerated well, no complications were noted  Performed by: ShMarisue BrooklynCMA  Follow up/ Additional notes: indewelling catheter placed, patient will stay in office for two hours until procedure is complete  1312-Catheter Removal    50053mf water was drained from the balloon. A 16FR foley cath was removed from the bladder no complications were noted . Patient tolerated well.  Performed by: ShaMarisue BrooklynMA  Follow up/ Additional notes:  Patient keep BCG installation in for 1 hour and 20 mintues

## 2021-11-29 ENCOUNTER — Encounter (INDEPENDENT_AMBULATORY_CARE_PROVIDER_SITE_OTHER): Payer: Medicare Other | Admitting: Ophthalmology

## 2021-11-29 DIAGNOSIS — I10 Essential (primary) hypertension: Secondary | ICD-10-CM

## 2021-11-29 DIAGNOSIS — H59031 Cystoid macular edema following cataract surgery, right eye: Secondary | ICD-10-CM | POA: Diagnosis not present

## 2021-11-29 DIAGNOSIS — H35372 Puckering of macula, left eye: Secondary | ICD-10-CM | POA: Diagnosis not present

## 2021-11-29 DIAGNOSIS — H43813 Vitreous degeneration, bilateral: Secondary | ICD-10-CM

## 2021-11-29 DIAGNOSIS — H35033 Hypertensive retinopathy, bilateral: Secondary | ICD-10-CM | POA: Diagnosis not present

## 2021-12-04 ENCOUNTER — Ambulatory Visit (INDEPENDENT_AMBULATORY_CARE_PROVIDER_SITE_OTHER): Payer: Medicare Other | Admitting: Urology

## 2021-12-04 DIAGNOSIS — C678 Malignant neoplasm of overlapping sites of bladder: Secondary | ICD-10-CM

## 2021-12-04 LAB — URINALYSIS, ROUTINE W REFLEX MICROSCOPIC
Bilirubin, UA: NEGATIVE
Glucose, UA: NEGATIVE
Ketones, UA: NEGATIVE
Nitrite, UA: NEGATIVE
Specific Gravity, UA: 1.02 (ref 1.005–1.030)
Urobilinogen, Ur: 0.2 mg/dL (ref 0.2–1.0)
pH, UA: 7 (ref 5.0–7.5)

## 2021-12-04 MED ORDER — BCG LIVE 50 MG IS SUSR
3.2400 mL | Freq: Once | INTRAVESICAL | Status: AC
  Administered 2021-12-04: 81 mg via INTRAVESICAL

## 2021-12-04 NOTE — Progress Notes (Signed)
BCG Bladder Instillation  BCG # 4 of 6  Due to Bladder Cancer patient is present today for a BCG treatment. Patient was cleaned and prepped in a sterile fashion with betadine. A 16FR catheter was inserted, urine return was noted 32m, urine was yellow in color. The balloon was filled with 146mof sterile water.  5016mf reconstituted BCG was instilled into the bladder. The catheter was then plugged for pt to remain in office for the duration of treatment.  Patient tolerated well, no complications were noted  Performed by: KouLevi AlandMA  Follow up/ Additional notes: Follow up as scheduled.   Catheter Removal  Patient is present today for a catheter removal.  91m69m water was drained from the balloon. A 16FR foley cath was removed from the bladder no complications were noted . Patient tolerated well.  Performed by: KourLevi AlandA  Follow up/ Additional notes: Follow up as scheduled.

## 2021-12-05 ENCOUNTER — Ambulatory Visit: Payer: Medicare Other

## 2021-12-09 DIAGNOSIS — L570 Actinic keratosis: Secondary | ICD-10-CM | POA: Diagnosis not present

## 2021-12-09 DIAGNOSIS — Z1283 Encounter for screening for malignant neoplasm of skin: Secondary | ICD-10-CM | POA: Diagnosis not present

## 2021-12-09 DIAGNOSIS — Z85828 Personal history of other malignant neoplasm of skin: Secondary | ICD-10-CM | POA: Diagnosis not present

## 2021-12-12 ENCOUNTER — Ambulatory Visit (INDEPENDENT_AMBULATORY_CARE_PROVIDER_SITE_OTHER): Payer: Medicare Other | Admitting: Physician Assistant

## 2021-12-12 DIAGNOSIS — C678 Malignant neoplasm of overlapping sites of bladder: Secondary | ICD-10-CM | POA: Diagnosis not present

## 2021-12-12 MED ORDER — BCG LIVE 50 MG IS SUSR
3.2400 mL | Freq: Once | INTRAVESICAL | Status: AC
  Administered 2021-12-12: 81 mg via INTRAVESICAL

## 2021-12-12 NOTE — Progress Notes (Signed)
BCG Bladder Instillation  BCG # 5 of 6  Due to Bladder Cancer patient is present today for a BCG treatment. Patient was cleaned and prepped in a sterile fashion with betadine. A 16FR catheter was inserted, urine return was noted 450 ml, urine was yellow in color.  104m of reconstituted BCG was instilled into the bladder. The catheter was then plug and patient will remain in office for duration of treatment. Patient tolerated well, no complications were noted Catheter Removal  Patient is present today for a catheter removal.  10 ml of water was drained from the balloon. A 16 FR foley cath was removed from the bladder no complications were noted . Patient tolerated well.  Performed by: SMarisue Brooklyn CMA  Follow up/ Additional notes: Followed up as schedule

## 2021-12-13 LAB — URINALYSIS, ROUTINE W REFLEX MICROSCOPIC
Bilirubin, UA: NEGATIVE
Glucose, UA: NEGATIVE
Ketones, UA: NEGATIVE
Leukocytes,UA: NEGATIVE
Nitrite, UA: NEGATIVE
Protein,UA: NEGATIVE
RBC, UA: NEGATIVE
Specific Gravity, UA: 1.015 (ref 1.005–1.030)
Urobilinogen, Ur: 0.2 mg/dL (ref 0.2–1.0)
pH, UA: 7 (ref 5.0–7.5)

## 2021-12-19 ENCOUNTER — Ambulatory Visit (INDEPENDENT_AMBULATORY_CARE_PROVIDER_SITE_OTHER): Payer: Medicare Other | Admitting: Urology

## 2021-12-19 DIAGNOSIS — C678 Malignant neoplasm of overlapping sites of bladder: Secondary | ICD-10-CM

## 2021-12-19 LAB — URINALYSIS, ROUTINE W REFLEX MICROSCOPIC
Bilirubin, UA: NEGATIVE
Glucose, UA: NEGATIVE
Ketones, UA: NEGATIVE
Nitrite, UA: NEGATIVE
Specific Gravity, UA: 1.02 (ref 1.005–1.030)
Urobilinogen, Ur: 1 mg/dL (ref 0.2–1.0)
pH, UA: 6.5 (ref 5.0–7.5)

## 2021-12-19 MED ORDER — BCG LIVE 50 MG IS SUSR: 3.2400 mL | Freq: Once | INTRAVESICAL | Status: AC

## 2021-12-19 NOTE — Progress Notes (Signed)
BCG Bladder Instillation  BCG # 6 of 6  Due to Bladder Cancer patient is present today for a BCG treatment. Patient was cleaned and prepped in a sterile fashion with betadine. A 16FR catheter was inserted, urine return was noted 42m, urine was yellow in color.  528mof reconstituted BCG was instilled into the bladder. The balloon was filled with 1069mf sterile water. The catheter was then plugged for pt to remain in office for the duration of treatment. The catheter was then removed. Patient tolerated well, no complications were noted  Performed by: Jamarri Vuncannon LPN  Follow up/ Additional notes: keep scheduled OV

## 2021-12-19 NOTE — Patient Instructions (Signed)

## 2022-01-02 ENCOUNTER — Encounter: Payer: Self-pay | Admitting: *Deleted

## 2022-01-02 ENCOUNTER — Telehealth: Payer: Self-pay | Admitting: *Deleted

## 2022-01-02 NOTE — Patient Outreach (Signed)
  Care Coordination   Initial Visit Note   01/02/2022 Name: SZYMON FOILES MRN: [id number] DOB: May 14, 1951  RYLEND PIETRZAK is a 71 y.o. year old male who sees Sasser, Silvestre Moment, MD for primary care. I spoke with  [name] by phone today  What matters to the patients health and wellness today?  Maintaining quality of life s/p bladder cancer    Goals Addressed             This Visit's Progress    Maintaining Quality of Life       Care Coordination Interventions: Provided patient and/or caregiver with verbal information about [facility] Care Coordination services (community resource). Patient was appreciative but declines a this time. No needs identified.  Assessed social determinant of health barriers Discussed Annual Wellness Visit. Patient is scheduled at PCP office on 02/03/22 for AWV.         SDOH assessments and interventions completed:  Yes  SDOH Interventions Today    Flowsheet Row Most Recent Value  SDOH Interventions   Financial Strain Interventions Intervention Not Indicated  Housing Interventions Intervention Not Indicated  Transportation Interventions Intervention Not Indicated        Care Coordination Interventions Activated:  Yes   Care Coordination Interventions:  Yes, provided   Follow up plan: No further intervention required.   Encounter Outcome:  Pt. Visit Completed

## 2022-01-27 DIAGNOSIS — Z23 Encounter for immunization: Secondary | ICD-10-CM | POA: Diagnosis not present

## 2022-02-11 ENCOUNTER — Encounter: Payer: Self-pay | Admitting: Urology

## 2022-02-11 ENCOUNTER — Ambulatory Visit (INDEPENDENT_AMBULATORY_CARE_PROVIDER_SITE_OTHER): Payer: Medicare Other | Admitting: Urology

## 2022-02-11 VITALS — BP 125/76 | HR 109 | Ht 67.0 in | Wt 210.0 lb

## 2022-02-11 DIAGNOSIS — Z8551 Personal history of malignant neoplasm of bladder: Secondary | ICD-10-CM

## 2022-02-11 DIAGNOSIS — N320 Bladder-neck obstruction: Secondary | ICD-10-CM

## 2022-02-11 DIAGNOSIS — C678 Malignant neoplasm of overlapping sites of bladder: Secondary | ICD-10-CM

## 2022-02-11 DIAGNOSIS — N39498 Other specified urinary incontinence: Secondary | ICD-10-CM

## 2022-02-11 DIAGNOSIS — Z8546 Personal history of malignant neoplasm of prostate: Secondary | ICD-10-CM

## 2022-02-11 MED ORDER — CIPROFLOXACIN HCL 500 MG PO TABS
500.0000 mg | ORAL_TABLET | Freq: Once | ORAL | Status: AC
  Administered 2022-02-11: 500 mg via ORAL

## 2022-02-11 NOTE — Progress Notes (Signed)
History of Present Illness:   Philip Mcneil returns for follow-up.  He has a history of urothelial carcinoma the bladder.  3.6.2023: Initial TURBT of a large bladder tumor performed om 3.6.2023.  Tumor location was right anterior/lateral bladder wall--approximately 4 cm in size. Gemcitabine placed postoperatively.   Path--[facility] NMIBC   5.18.2023: Repeat resection.  Pathology all benign.  6.23.2023: Here for follow-up.  Still with troublesome urinary leakage.  This is been a bother since his first TURBT in March.  Most of his urine comes out in his diaper.  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  8.3.2023: Completed 6th induction BCG Rx  9.26.2023: He tolerated his BCG treatment well.  He has had no gross hematuria.  Near total incontinence.  Past Medical History:  Diagnosis Date   Anemia    Bladder cancer [facility])    urologist-- dr Diona Fanti   Chronic constipation    GAD (generalized anxiety disorder)    GERD (gastroesophageal reflux disease)    History of atrial fibrillation    ED visit in recovery post tympanoplasty [phone] with brief episode atrial fib, per note cardiology recommended pt follow up with pcp   History of CVA (cerebrovascular accident) without residual deficits 04/2021   hospital admission ([facility]) 04-29-2021 dx orthostatic hypotension, pneumonia, dehydration, generalized weakness, stroke;  per MRI in care everywhere small chronic cortical infarct within left parietal lobe postcenral gyrus   History of external beam radiation therapy 2000   salvage post prostatectomy   History of prostate cancer 2000   followed by dr Diona Fanti;   s/p  radical prostatectomy and salvage radiation in 2000   Hypertension    followed by pcp;;  (hx admission [facility] 2012 had normal nuclear stress test  & echo 08-19-2010 in epic) ;;   previously seen by cardiology , dr Natividad Brood note in epic 06-15-2017   Lower urinary tract symptoms (LUTS)    OA (osteoarthritis)    Retinal cyst of right eye    per pt w/ floaters   Strabismic amblyopia of left eye    per pt w/ decreased vision , stated 20/ 200   Tachycardia    Urinary incontinence     Past Surgical History:  Procedure Laterality Date   CATARACT EXTRACTION W/ INTRAOCULAR LENS IMPLANT Bilateral    2020   CYSTOSCOPY W/ RETROGRADES Bilateral 07/22/2021   Procedure: CYSTOSCOPY WITH RETROGRADE PYELOGRAM;  Surgeon: Franchot Gallo, MD;  Location: [facility];  Service: Urology;  Laterality: Bilateral;   INSERTION OF MESH N/A 11/23/2013   Procedure: INSERTION OF MESH;  Surgeon: Jamesetta So, MD;  Location: [facility];  Service: General;  Laterality: N/A;   KNEE ARTHROSCOPY Right    unsure date   LUMBAR SPINE SURGERY     x2  in [place] Right    age 56;    right foot fracture   POSTERIOR CERVICAL LAMINECTOMY     1990s   RETROPUBIC PROSTATECTOMY  2000   '@[facility]'$ :   per pt had Right inguinal hernia repair   ROTATOR CUFF REPAIR Right 2004   STRABISMUS SURGERY Left    age 51 and 39   THUMB AMPUTATION Left 2003   revision of amputation injusry   TOTAL KNEE ARTHROPLASTY Left 09/25/2020   Procedure: TOTAL KNEE ARTHROPLASTY;  Surgeon: Paralee Cancel,  MD;  Location: [facility];  Service: Orthopedics;  Laterality: Left;  70 mins   TRANSURETHRAL RESECTION OF BLADDER TUMOR N/A 10/03/2021   Procedure: REPEAT TRANSURETHRAL RESECTION OF BLADDER TUMOR (TURBT);  Surgeon: Franchot Gallo, MD;  Location: [facility];  Service: Urology;  Laterality: N/A;  [place] TUMOR WITH MITOMYCIN-C N/A 07/22/2021   Procedure: TRANSURETHRAL RESECTION OF BLADDER TUMOR WITH GEMCITABINE;  Surgeon: Franchot Gallo, MD;  Location: [facility];  Service: Urology;  Laterality:  N/A;   TYMPANOPLASTY Left 02/22/2018   Procedure: TYMPANOPLASTY;  Surgeon: [name], MD;  Location: [place];  Service: ENT;  Laterality: Left;   UMBILICAL HERNIA REPAIR N/A 11/23/2013   Procedure: UMBILICAL HERNIORRHAPHY;  Surgeon: Jamesetta So, MD;  Location: [facility];  Service: General;  Laterality: N/A;    Home Medications:  Allergies as of 02/11/2022       Reactions   Morphine And Related Anaphylaxis   Oxycodone Other (See Comments)   Itchy / jumpy (Percocet)        Medication List        Accurate as of February 11, 2022  8:40 AM. If you have any questions, ask your nurse or doctor.          acetaminophen 500 MG tablet Commonly known as: TYLENOL Take 1,000 mg by mouth as needed for mild pain.   ALPRAZolam 0.5 MG tablet Commonly known as: XANAX Take 0.5 mg by mouth 3 (three) times daily as needed for anxiety.   losartan 25 MG tablet Commonly known as: COZAAR Take 25 mg by mouth daily.   Loteprednol Etabonate 0.5 % Gel Place 1 drop into the right eye daily.   Myrbetriq 25 MG Tb24 tablet Generic drug: mirabegron ER Myrbetriq 25 mg tablet,extended release  Take 1 tablet every day by oral route.   oxybutynin 5 MG tablet Commonly known as: DITROPAN Take 5 mg by mouth every 8 (eight) hours as needed for bladder spasms.   pantoprazole 40 MG tablet Commonly known as: PROTONIX Take 40 mg by mouth daily.   Prolensa 0.07 % Soln Generic drug: Bromfenac Sodium Place 1 drop into the right eye at bedtime.        Allergies:  Allergies  Allergen Reactions   Morphine And Related Anaphylaxis   Oxycodone Other (See Comments)    Itchy / jumpy (Percocet)    Family History  Problem Relation Age of Onset   Alzheimer's disease Mother    Alzheimer's disease Father    Prostate cancer Father    Alzheimer's disease Paternal Grandfather    Allergic rhinitis Neg Hx    Asthma Neg Hx    Eczema Neg Hx    Immunodeficiency Neg Hx    Urticaria Neg  Hx     Social History:  reports that he quit smoking about 18 years ago. His smoking use included cigarettes. He has a 5.00 pack-year smoking history. He has never used smokeless tobacco. He reports that he does not drink alcohol and does not use drugs.  ROS: A complete review of systems was performed.  All systems are negative except for pertinent findings as noted.  Physical Exam:  Vital signs in last 24 hours: There were no vitals taken for this visit. Constitutional:  Alert and oriented, No acute distress Cardiovascular: Regular rate  Respiratory: Normal respiratory effort Genitourinary: Normal male phallus, although somewhat retracted Lymphatic: No lymphadenopathy Neurologic: Grossly intact, no focal deficits Psychiatric: Normal mood  and affect  I have reviewed prior pt notes  I have reviewed notes from referring/previous physicians  I have reviewed urinalysis results  I have independently reviewed prior imaging  I have reviewed prior PSA results  I have reviewed prior urine culture  Cystoscopy Procedure Note:  Indication: Surveillance following bladder cancer treatment  After informed consent and discussion of the procedure and its risks, Philip Mcneil was positioned and prepped in the standard fashion.  Cystoscopy was performed with a flexible cystoscope.   Findings: Urethra: No stricture or lesion Prostate: Absent Bladder neck: Wide open Ureteral orifices: Normal Bladder: No lesions.  The patient tolerated the procedure well.     Impression/Assessment:  History of prostate cancer, status post radical prostatectomy and salvage radiotherapy with no evidence of recurrence  History of high-grade nonmuscle invasive bladder cancer, status post resection earlier this year, completed induction BCG treatment last month, cystoscopy was negative  Total incontinence, secondary to his radical prostatectomy and salvage radiotherapy, quite bothersome to the  patient  Plan:  1.  I will have him come back in 3 weeks to start his first maintenance BCG series of 3  2.  Office visit in 4 months for repeat cystoscopy  I gave him information about the men's Liberator.  This may well be covered by insurance

## 2022-02-12 LAB — URINALYSIS, ROUTINE W REFLEX MICROSCOPIC
Bilirubin, UA: NEGATIVE
Glucose, UA: NEGATIVE
Ketones, UA: NEGATIVE
Leukocytes,UA: NEGATIVE
Nitrite, UA: NEGATIVE
Specific Gravity, UA: 1.02 (ref 1.005–1.030)
Urobilinogen, Ur: 1 mg/dL (ref 0.2–1.0)
pH, UA: 7 (ref 5.0–7.5)

## 2022-02-12 LAB — MICROSCOPIC EXAMINATION

## 2022-02-15 DIAGNOSIS — K2100 Gastro-esophageal reflux disease with esophagitis, without bleeding: Secondary | ICD-10-CM | POA: Diagnosis not present

## 2022-02-15 DIAGNOSIS — E782 Mixed hyperlipidemia: Secondary | ICD-10-CM | POA: Diagnosis not present

## 2022-02-15 DIAGNOSIS — I10 Essential (primary) hypertension: Secondary | ICD-10-CM | POA: Diagnosis not present

## 2022-02-20 DIAGNOSIS — E7849 Other hyperlipidemia: Secondary | ICD-10-CM | POA: Diagnosis not present

## 2022-02-20 DIAGNOSIS — R5381 Other malaise: Secondary | ICD-10-CM | POA: Diagnosis not present

## 2022-02-20 DIAGNOSIS — E7801 Familial hypercholesterolemia: Secondary | ICD-10-CM | POA: Diagnosis not present

## 2022-02-20 DIAGNOSIS — I10 Essential (primary) hypertension: Secondary | ICD-10-CM | POA: Diagnosis not present

## 2022-02-20 DIAGNOSIS — R319 Hematuria, unspecified: Secondary | ICD-10-CM | POA: Diagnosis not present

## 2022-02-27 DIAGNOSIS — M609 Myositis, unspecified: Secondary | ICD-10-CM | POA: Diagnosis not present

## 2022-02-27 DIAGNOSIS — Z0001 Encounter for general adult medical examination with abnormal findings: Secondary | ICD-10-CM | POA: Diagnosis not present

## 2022-02-27 DIAGNOSIS — K2100 Gastro-esophageal reflux disease with esophagitis, without bleeding: Secondary | ICD-10-CM | POA: Diagnosis not present

## 2022-02-27 DIAGNOSIS — R5383 Other fatigue: Secondary | ICD-10-CM | POA: Diagnosis not present

## 2022-02-27 DIAGNOSIS — Z1389 Encounter for screening for other disorder: Secondary | ICD-10-CM | POA: Diagnosis not present

## 2022-02-27 DIAGNOSIS — R000 Tachycardia, unspecified: Secondary | ICD-10-CM | POA: Diagnosis not present

## 2022-02-27 DIAGNOSIS — R319 Hematuria, unspecified: Secondary | ICD-10-CM | POA: Diagnosis not present

## 2022-02-27 DIAGNOSIS — F17210 Nicotine dependence, cigarettes, uncomplicated: Secondary | ICD-10-CM | POA: Diagnosis not present

## 2022-02-27 DIAGNOSIS — I10 Essential (primary) hypertension: Secondary | ICD-10-CM | POA: Diagnosis not present

## 2022-02-27 DIAGNOSIS — Z1331 Encounter for screening for depression: Secondary | ICD-10-CM | POA: Diagnosis not present

## 2022-02-27 DIAGNOSIS — F411 Generalized anxiety disorder: Secondary | ICD-10-CM | POA: Diagnosis not present

## 2022-02-27 DIAGNOSIS — T466X5A Adverse effect of antihyperlipidemic and antiarteriosclerotic drugs, initial encounter: Secondary | ICD-10-CM | POA: Diagnosis not present

## 2022-03-03 ENCOUNTER — Ambulatory Visit (INDEPENDENT_AMBULATORY_CARE_PROVIDER_SITE_OTHER): Payer: Medicare Other | Admitting: Physician Assistant

## 2022-03-03 DIAGNOSIS — C678 Malignant neoplasm of overlapping sites of bladder: Secondary | ICD-10-CM | POA: Diagnosis not present

## 2022-03-03 LAB — URINALYSIS, ROUTINE W REFLEX MICROSCOPIC
Bilirubin, UA: NEGATIVE
Glucose, UA: NEGATIVE
Ketones, UA: NEGATIVE
Leukocytes,UA: NEGATIVE
Nitrite, UA: NEGATIVE
Protein,UA: NEGATIVE
Specific Gravity, UA: <1.005 — ABNORMAL LOW (ref 1.005–1.030)
Urobilinogen, Ur: 0.2 mg/dL (ref 0.2–1.0)
pH, UA: 7 (ref 5.0–7.5)

## 2022-03-03 MED ORDER — BCG LIVE 50 MG IS SUSR
3.2400 mL | Freq: Once | INTRAVESICAL | Status: AC
  Administered 2022-03-03: 81 mg via INTRAVESICAL

## 2022-03-03 NOTE — Progress Notes (Signed)
BCG Bladder Instillation  BCG # 1 of 3  Due to Bladder Cancer patient is present today for a BCG treatment. Patient was cleaned and prepped in a sterile fashion with betadine. A 16 FR catheter was inserted, urine return was noted 31m, urine was yellow in color.  525mof reconstituted BCG was instilled into the bladder. The catheter was then removed. Patient tolerated well, no complications were noted  Performed by: ShMarisue BrooklynCMA  Follow up/ Additional notes: Follow up as scheduled

## 2022-03-04 ENCOUNTER — Ambulatory Visit: Payer: Medicare Other

## 2022-03-11 ENCOUNTER — Ambulatory Visit (INDEPENDENT_AMBULATORY_CARE_PROVIDER_SITE_OTHER): Payer: Medicare Other | Admitting: Physician Assistant

## 2022-03-11 DIAGNOSIS — C678 Malignant neoplasm of overlapping sites of bladder: Secondary | ICD-10-CM

## 2022-03-11 LAB — URINALYSIS, ROUTINE W REFLEX MICROSCOPIC
Bilirubin, UA: NEGATIVE
Glucose, UA: NEGATIVE
Ketones, UA: NEGATIVE
Leukocytes,UA: NEGATIVE
Nitrite, UA: NEGATIVE
Protein,UA: NEGATIVE
Specific Gravity, UA: 1.01 (ref 1.005–1.030)
Urobilinogen, Ur: 0.2 mg/dL (ref 0.2–1.0)
pH, UA: 7 (ref 5.0–7.5)

## 2022-03-11 LAB — MICROSCOPIC EXAMINATION: Bacteria, UA: NONE SEEN

## 2022-03-11 MED ORDER — BCG LIVE 50 MG IS SUSR
3.2400 mL | Freq: Once | INTRAVESICAL | Status: AC
  Administered 2022-03-11: 81 mg via INTRAVESICAL

## 2022-03-11 NOTE — Patient Instructions (Signed)

## 2022-03-11 NOTE — Progress Notes (Unsigned)
BCG Bladder Instillation  BCG # 2 of 3  Due to Bladder Cancer patient is present today for a BCG treatment. Patient was cleaned and prepped in a sterile fashion with betadine. A 16FR catheter was inserted, urine return was noted 68m, urine was yellow in color.  574mof reconstituted BCG was instilled into the bladder. Patient unable to hold urine= patient will remain in office for 2 hours to complete BCG instillation  Performed by: AmGibson RampRN  Follow up/ Additional notes: ***

## 2022-03-18 ENCOUNTER — Encounter: Payer: Self-pay | Admitting: Urology

## 2022-03-18 ENCOUNTER — Ambulatory Visit (INDEPENDENT_AMBULATORY_CARE_PROVIDER_SITE_OTHER): Payer: Medicare Other | Admitting: Urology

## 2022-03-18 DIAGNOSIS — C678 Malignant neoplasm of overlapping sites of bladder: Secondary | ICD-10-CM

## 2022-03-18 LAB — URINALYSIS, ROUTINE W REFLEX MICROSCOPIC
Bilirubin, UA: NEGATIVE
Glucose, UA: NEGATIVE
Ketones, UA: NEGATIVE
Leukocytes,UA: NEGATIVE
Nitrite, UA: NEGATIVE
Protein,UA: NEGATIVE
Specific Gravity, UA: 1.015 (ref 1.005–1.030)
Urobilinogen, Ur: 0.2 mg/dL (ref 0.2–1.0)
pH, UA: 7.5 (ref 5.0–7.5)

## 2022-03-18 MED ORDER — BCG LIVE 50 MG IS SUSR
3.2400 mL | Freq: Once | INTRAVESICAL | Status: AC
  Administered 2022-03-18: 81 mg via INTRAVESICAL

## 2022-03-18 NOTE — Progress Notes (Addendum)
BCG Bladder Instillation  BCG # 3 of 3  Due to Bladder Cancer patient is present today for a BCG treatment. Patient was cleaned and prepped in a sterile fashion with betadine. A 14 FR catheter was inserted, urine return was noted 5 ml, urine was pale yellow in color.  12m of reconstituted BCG was instilled into the bladder. Patient unable to hold urine, patient will remain in office for 2 hours  to complete BCG instillation.The catheter was then removed. Patient tolerated well, no complications were noted  Performed by: SMarisue Brooklyn CMA  Follow up/ Additional notes: Follow up as schedule  Agree with above.  BPrimus Bravo MD CLucina MellowUrology [phone]

## 2022-04-01 DIAGNOSIS — H9193 Unspecified hearing loss, bilateral: Secondary | ICD-10-CM | POA: Diagnosis not present

## 2022-04-02 DIAGNOSIS — H903 Sensorineural hearing loss, bilateral: Secondary | ICD-10-CM | POA: Diagnosis not present

## 2022-05-30 ENCOUNTER — Encounter (INDEPENDENT_AMBULATORY_CARE_PROVIDER_SITE_OTHER): Payer: Medicare Other | Admitting: Ophthalmology

## 2022-05-30 DIAGNOSIS — H59031 Cystoid macular edema following cataract surgery, right eye: Secondary | ICD-10-CM

## 2022-05-30 DIAGNOSIS — H43813 Vitreous degeneration, bilateral: Secondary | ICD-10-CM

## 2022-05-30 DIAGNOSIS — I10 Essential (primary) hypertension: Secondary | ICD-10-CM | POA: Diagnosis not present

## 2022-05-30 DIAGNOSIS — H35372 Puckering of macula, left eye: Secondary | ICD-10-CM | POA: Diagnosis not present

## 2022-05-30 DIAGNOSIS — H35033 Hypertensive retinopathy, bilateral: Secondary | ICD-10-CM | POA: Diagnosis not present

## 2022-06-08 NOTE — Progress Notes (Signed)
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  3.6.2023: Initial TURBT of a large bladder tumor performed om 3.6.2023.  Tumor location was right anterior/lateral bladder wall--approximately 4 cm in size. Gemcitabine placed postoperatively.   Path--HG NMIBC   5.18.2023: Repeat resection.  Pathology all benign.  6.23.2023: Here for follow-up.  Still with troublesome urinary leakage.  This is been a bother since his first TURBT in March.  Most of his urine comes out in his diaper.  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  8.3.2023: Completed 6th induction BCG Rx  9.26.2023: Cysto negative   10.31.2023: Finished 1st round of BCG maintenance.  1.23.2024: He is here today for his first cystoscopy after BCG maintenance.  No gross hematuria.  Stable leakage.  Past Medical History:  Diagnosis Date   Anemia    Bladder cancer [facility])    urologist-- dr Diona Fanti   Chronic constipation    GAD (generalized anxiety disorder)    GERD (gastroesophageal reflux disease)    History of atrial fibrillation    ED visit in recovery post tympanoplasty [phone] with brief episode atrial fib, per note cardiology recommended pt follow up with pcp   History of CVA (cerebrovascular accident) without residual deficits 04/2021   hospital admission ([facility]) 04-29-2021 dx orthostatic hypotension, pneumonia, dehydration, generalized weakness, stroke;  per MRI in care everywhere small chronic cortical infarct within left parietal lobe postcenral gyrus   History of external beam radiation therapy 2000   salvage post prostatectomy   History of prostate cancer 2000   followed by dr Diona Fanti;   s/p  radical prostatectomy and salvage radiation in 2000   Hypertension    followed by pcp;;  (hx admission  [facility] 2012 had normal nuclear stress test & echo 08-19-2010 in epic) ;;   previously seen by cardiology , dr Natividad Brood note in epic 06-15-2017   Lower urinary tract symptoms (LUTS)    OA (osteoarthritis)    Retinal cyst of right eye    per pt w/ floaters   Strabismic amblyopia of left eye    per pt w/ decreased vision , stated 20/ 200   Tachycardia    Urinary incontinence     Past Surgical History:  Procedure Laterality Date   CATARACT EXTRACTION W/ INTRAOCULAR LENS IMPLANT Bilateral    2020   CYSTOSCOPY W/ RETROGRADES Bilateral 07/22/2021   Procedure: CYSTOSCOPY WITH RETROGRADE PYELOGRAM;  Surgeon: Franchot Gallo, MD;  Location: [facility];  Service: Urology;  Laterality: Bilateral;   INSERTION OF MESH N/A 11/23/2013   Procedure: INSERTION OF MESH;  Surgeon: Jamesetta So, MD;  Location: [facility];  Service: General;  Laterality: N/A;   KNEE ARTHROSCOPY Right    unsure date   LUMBAR SPINE SURGERY     x2  in [place] Right    age 31;    right foot fracture   POSTERIOR CERVICAL LAMINECTOMY     1990s   RETROPUBIC PROSTATECTOMY  2000   '@[facility]'$ :   per pt had Right inguinal hernia repair   ROTATOR CUFF REPAIR Right 2004   STRABISMUS SURGERY Left    age 7 and 49   THUMB AMPUTATION Left 2003   revision of amputation injusry   TOTAL KNEE ARTHROPLASTY Left 09/25/2020   Procedure: TOTAL KNEE  ARTHROPLASTY;  Surgeon: Paralee Cancel, MD;  Location: [facility];  Service: Orthopedics;  Laterality: Left;  70 mins   TRANSURETHRAL RESECTION OF BLADDER TUMOR N/A 10/03/2021   Procedure: REPEAT TRANSURETHRAL RESECTION OF BLADDER TUMOR (TURBT);  Surgeon: Franchot Gallo, MD;  Location: [facility];  Service: Urology;  Laterality: N/A;  [place] TUMOR WITH MITOMYCIN-C N/A 07/22/2021   Procedure: TRANSURETHRAL RESECTION OF BLADDER TUMOR WITH GEMCITABINE;  Surgeon: Franchot Gallo, MD;  Location: [facility];  Service: Urology;  Laterality: N/A;   TYMPANOPLASTY Left 02/22/2018   Procedure: TYMPANOPLASTY;  Surgeon: [name], MD;  Location: [place];  Service: ENT;  Laterality: Left;   UMBILICAL HERNIA REPAIR N/A 11/23/2013   Procedure: UMBILICAL HERNIORRHAPHY;  Surgeon: Jamesetta So, MD;  Location: [facility];  Service: General;  Laterality: N/A;    Home Medications:  Allergies as of 06/10/2022       Reactions   Morphine And Related Anaphylaxis   Oxycodone Other (See Comments)   Itchy / jumpy (Percocet)        Medication List        Accurate as of June 08, 2022 11:50 AM. If you have any questions, ask your nurse or doctor.          acetaminophen 500 MG tablet Commonly known as: TYLENOL Take 1,000 mg by mouth as needed for mild pain.   ALPRAZolam 0.5 MG tablet Commonly known as: XANAX Take 0.5 mg by mouth 3 (three) times daily as needed for anxiety.   losartan 25 MG tablet Commonly known as: COZAAR Take 25 mg by mouth daily.   Loteprednol Etabonate 0.5 % Gel Place 1 drop into the right eye daily.   Myrbetriq 25 MG Tb24 tablet Generic drug: mirabegron ER Myrbetriq 25 mg tablet,extended release  Take 1 tablet every day by oral route.   oxybutynin 5 MG tablet Commonly known as: DITROPAN Take 5 mg by mouth every 8 (eight) hours as needed for bladder spasms.   pantoprazole 40 MG tablet Commonly known as: PROTONIX Take 40 mg by mouth daily.   Prolensa 0.07 % Soln Generic drug: Bromfenac Sodium Place 1 drop into the right eye at bedtime.        Allergies:  Allergies  Allergen Reactions   Morphine And Related Anaphylaxis   Oxycodone Other (See Comments)    Itchy / jumpy (Percocet)    Family History  Problem Relation Age of Onset   Alzheimer's disease Mother    Alzheimer's disease Father    Prostate cancer Father    Alzheimer's disease Paternal Grandfather    Allergic rhinitis Neg Hx    Asthma Neg Hx    Eczema Neg Hx     Immunodeficiency Neg Hx    Urticaria Neg Hx     Social History:  reports that he quit smoking about 18 years ago. His smoking use included cigarettes. He has a 5.00 pack-year smoking history. He has never used smokeless tobacco. He reports that he does not drink alcohol and does not use drugs.  ROS: A complete review of systems was performed.  All systems are negative except for pertinent findings as noted.  Physical Exam:  Vital signs in last 24 hours: There were no vitals taken for this visit. Constitutional:  Alert and oriented, No acute distress Cardiovascular: Regular rate  Respiratory: Normal respiratory effort Neurologic: Grossly intact, no focal deficits Psychiatric: Normal mood and affect  Cystoscopy Procedure Note:  Indication: Bladder cancer surveillance  After informed consent and discussion of the procedure and its risks, Philip Mcneil was positioned and prepped in the standard fashion.  Cystoscopy was performed with a flexible cystoscope.   Findings: Urethra: No stricture or lesion Prostate: Absent Bladder neck: No contracture Ureteral orifices: Normal Bladder: No urothelial lesions.  Mild erythema and patches posteriorly on the bladder wall.  The patient tolerated the procedure well.     I have reviewed prior pt notes  I have reviewed urinalysis results  I have independently reviewed prior imaging  I have reviewed prior PSA results  I have reviewed prior pathology results   Impression/Assessment:  1.  History of high-grade nonmuscle invasive bladder cancer, completed induction and, most recently, first maintenance dose in late October.  Cystoscopy today was unremarkable  2.  History of prostate cancer, status post prostatectomy and salvage radiotherapy without evidence of recurrence  Plan:  1.  Bladder washings were sent for cytology  2.  I will have him come back in 3 weeks to begin 3 more maintenance BCG treatments  3.  I will see back in 3  months for repeat cystoscopy

## 2022-06-10 ENCOUNTER — Ambulatory Visit (INDEPENDENT_AMBULATORY_CARE_PROVIDER_SITE_OTHER): Payer: Medicare Other | Admitting: Urology

## 2022-06-10 VITALS — BP 129/72 | HR 110

## 2022-06-10 DIAGNOSIS — Z8551 Personal history of malignant neoplasm of bladder: Secondary | ICD-10-CM | POA: Diagnosis not present

## 2022-06-10 DIAGNOSIS — C678 Malignant neoplasm of overlapping sites of bladder: Secondary | ICD-10-CM | POA: Diagnosis not present

## 2022-06-10 DIAGNOSIS — Z8546 Personal history of malignant neoplasm of prostate: Secondary | ICD-10-CM | POA: Diagnosis not present

## 2022-06-10 DIAGNOSIS — R8289 Other abnormal findings on cytological and histological examination of urine: Secondary | ICD-10-CM | POA: Diagnosis not present

## 2022-06-10 DIAGNOSIS — Z23 Encounter for immunization: Secondary | ICD-10-CM | POA: Diagnosis not present

## 2022-06-10 LAB — MICROSCOPIC EXAMINATION: Bacteria, UA: NONE SEEN

## 2022-06-10 LAB — URINALYSIS, ROUTINE W REFLEX MICROSCOPIC
Bilirubin, UA: NEGATIVE
Glucose, UA: NEGATIVE
Ketones, UA: NEGATIVE
Leukocytes,UA: NEGATIVE
Nitrite, UA: NEGATIVE
Protein,UA: NEGATIVE
Specific Gravity, UA: 1.015 (ref 1.005–1.030)
Urobilinogen, Ur: 0.2 mg/dL (ref 0.2–1.0)
pH, UA: 7 (ref 5.0–7.5)

## 2022-06-10 MED ORDER — CIPROFLOXACIN HCL 500 MG PO TABS
500.0000 mg | ORAL_TABLET | Freq: Once | ORAL | Status: AC
  Administered 2022-06-10: 500 mg via ORAL

## 2022-06-11 LAB — CYTOLOGY, URINE

## 2022-06-12 ENCOUNTER — Telehealth: Payer: Self-pay

## 2022-06-12 NOTE — Telephone Encounter (Signed)
Patient is aware his urine cytology showed some atypical cell and this not unusual for having completed the BCG, F/U as planned. Patient voiced understanding

## 2022-06-12 NOTE — Telephone Encounter (Signed)
-----  Message from Franchot Gallo, MD sent at 06/12/2022  1:38 PM EST ----- Notify patient that urine cytology showed some atypical cells, not unusual for having completed the BCG.  Follow-up as planned. ----- Message ----- From: Sherrilyn Rist, CMA Sent: 06/11/2022   5:54 PM EST To: Franchot Gallo, MD  Please review

## 2022-07-01 ENCOUNTER — Ambulatory Visit (INDEPENDENT_AMBULATORY_CARE_PROVIDER_SITE_OTHER): Payer: Medicare Other | Admitting: Urology

## 2022-07-01 DIAGNOSIS — C678 Malignant neoplasm of overlapping sites of bladder: Secondary | ICD-10-CM | POA: Diagnosis not present

## 2022-07-01 LAB — MICROSCOPIC EXAMINATION: Bacteria, UA: NONE SEEN

## 2022-07-01 LAB — URINALYSIS, ROUTINE W REFLEX MICROSCOPIC
Bilirubin, UA: NEGATIVE
Glucose, UA: NEGATIVE
Ketones, UA: NEGATIVE
Leukocytes,UA: NEGATIVE
Nitrite, UA: NEGATIVE
Protein,UA: NEGATIVE
Specific Gravity, UA: 1.015 (ref 1.005–1.030)
Urobilinogen, Ur: 0.2 mg/dL (ref 0.2–1.0)
pH, UA: 7 (ref 5.0–7.5)

## 2022-07-01 MED ORDER — BCG LIVE 50 MG IS SUSR
3.2400 mL | Freq: Once | INTRAVESICAL | Status: AC
  Administered 2022-07-01: 81 mg via INTRAVESICAL

## 2022-07-01 NOTE — Progress Notes (Signed)
BCG Bladder Instillation  BCG # 1 of 3  Due to Bladder Cancer patient is present today for a BCG treatment. Patient was cleaned and prepped in a sterile fashion with betadine. A 16FR catheter was inserted, urine return was noted 65m, urine was yellow in color, balloon was filled with 178mof sterile water.  5074mf reconstituted BCG was instilled into the bladder. Due to urinary incontinence the catheter was then plugged and patient will remain in office for the duration of treatment.  After 2 hours the 10m37ms drained from the bladder.  10ml33msterile water was drained from the balloon and the catheter was removed.  Patient tolerated well, no complications were noted  Performed by: KourtLevi Aland  Follow up/ Additional notes: follow up as scheduled.

## 2022-07-08 ENCOUNTER — Ambulatory Visit (INDEPENDENT_AMBULATORY_CARE_PROVIDER_SITE_OTHER): Payer: Medicare Other | Admitting: Urology

## 2022-07-08 DIAGNOSIS — C678 Malignant neoplasm of overlapping sites of bladder: Secondary | ICD-10-CM | POA: Diagnosis not present

## 2022-07-08 LAB — URINALYSIS, ROUTINE W REFLEX MICROSCOPIC
Bilirubin, UA: NEGATIVE
Glucose, UA: NEGATIVE
Ketones, UA: NEGATIVE
Leukocytes,UA: NEGATIVE
Nitrite, UA: NEGATIVE
Protein,UA: NEGATIVE
Specific Gravity, UA: 1.015 (ref 1.005–1.030)
Urobilinogen, Ur: 0.2 mg/dL (ref 0.2–1.0)
pH, UA: 7 (ref 5.0–7.5)

## 2022-07-08 MED ORDER — BCG LIVE 50 MG IS SUSR
3.2400 mL | Freq: Once | INTRAVESICAL | Status: AC
  Administered 2022-07-08: 81 mg via INTRAVESICAL

## 2022-07-08 NOTE — Progress Notes (Addendum)
BCG Bladder Instillation  BCG # 2 of 3    Due to Bladder Cancer patient is present today for a BCG treatment. Patient was cleaned and prepped in a sterile fashion with betadine. A 16FR catheter was inserted, urine return was noted 25m, urine was yellow in color, balloon was filled with 123mof sterile water.  5038mf reconstituted BCG was instilled into the bladder. Due to urinary incontinence the catheter was then plugged and patient will remain in office for the duration of treatment.  After 1 hour 200m15ms drained from the bladder.  10ml3msterile water was drained from the balloon and the catheter was removed.  Patient tolerated well, no complications were noted   Performed by: KourtLevi Aland   Follow up/ Additional notes: follow up as scheduled.

## 2022-07-15 ENCOUNTER — Ambulatory Visit (INDEPENDENT_AMBULATORY_CARE_PROVIDER_SITE_OTHER): Payer: Medicare Other | Admitting: Urology

## 2022-07-15 DIAGNOSIS — C678 Malignant neoplasm of overlapping sites of bladder: Secondary | ICD-10-CM | POA: Diagnosis not present

## 2022-07-15 DIAGNOSIS — N3289 Other specified disorders of bladder: Secondary | ICD-10-CM

## 2022-07-15 MED ORDER — BCG LIVE 50 MG IS SUSR
3.2400 mL | Freq: Once | INTRAVESICAL | Status: AC
  Administered 2022-07-15: 81 mg via INTRAVESICAL

## 2022-07-15 NOTE — Progress Notes (Addendum)
BCG # 3 of 3   Due to Bladder Cancer patient is present today for a BCG treatment. Patient was cleaned and prepped in a sterile fashion with betadine. A 16FR catheter was inserted, urine return was noted 43m, urine was yellow in color, balloon was filled with 14mof sterile water.  5041mf reconstituted BCG was instilled into the bladder. Due to urinary incontinence the catheter was then plugged and patient will remain in office for the duration of treatment.  Due to bladder spasms after 1 hour the 60m12ms drained from the bladder.  10ml46msterile water was drained from the balloon and the catheter was removed.  Patient tolerated well, no complications were noted   Performed by: KourtLevi Aland   Follow up/ Additional notes: follow up as scheduled.

## 2022-07-16 LAB — URINALYSIS, ROUTINE W REFLEX MICROSCOPIC
Bilirubin, UA: NEGATIVE
Glucose, UA: NEGATIVE
Ketones, UA: NEGATIVE
Nitrite, UA: NEGATIVE
Protein,UA: NEGATIVE
Specific Gravity, UA: 1.02 (ref 1.005–1.030)
Urobilinogen, Ur: 0.2 mg/dL (ref 0.2–1.0)
pH, UA: 7 (ref 5.0–7.5)

## 2022-08-13 IMAGING — CT CT ABD-PELV W/ CM
2 of 5 series · 17 of 46 positions shown, 19 images · IV contrast (omnipaque)
Comparison: CT abdomen and pelvis 07/06/2020

CLINICAL DATA: Let us hematuria

EXAM:
CT ABDOMEN AND PELVIS WITH CONTRAST
TECHNIQUE: Multidetector CT imaging of the abdomen and pelvis was performed
using the standard protocol following bolus administration of
intravenous contrast.
CONTRAST:  80mL OMNIPAQUE IOHEXOL 350 MG/ML SOLN

[Series 2: axial st · axial · 0.83mm/px · z∈[+1061,+1471]mm · 14 of 96 slices shown, 16 images]
[im 7/96  soft-tissue]
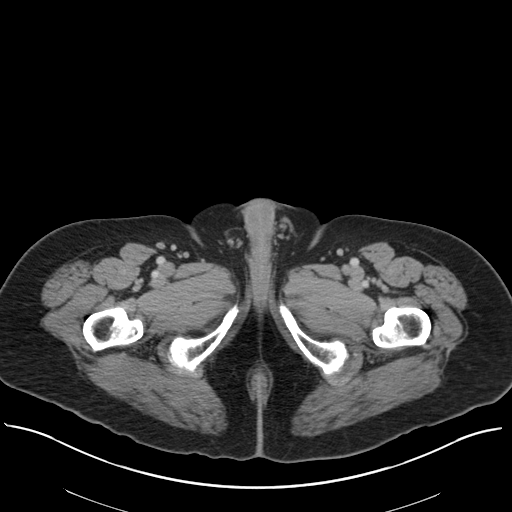
[im 7/96  bone]
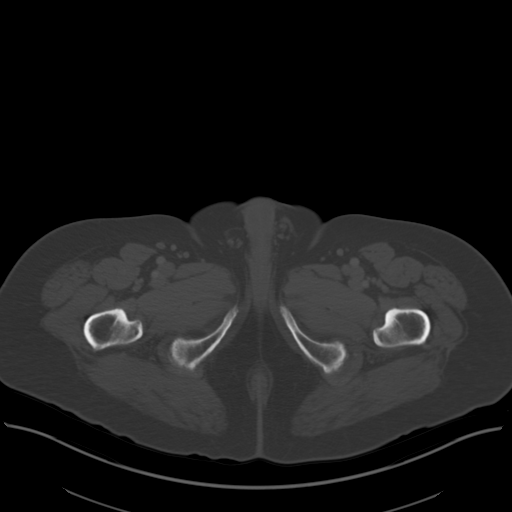
[im 13/96  soft-tissue]
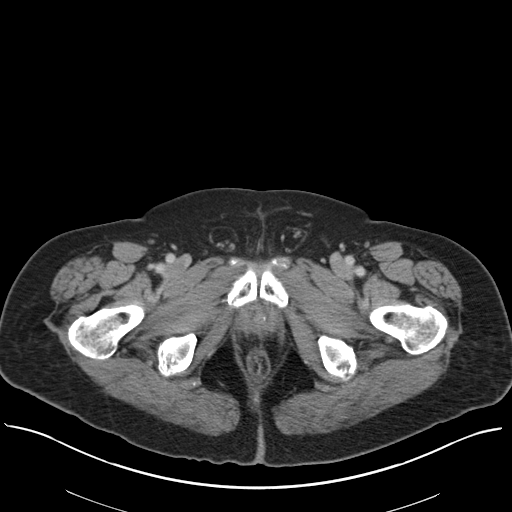
[im 20/96  soft-tissue]
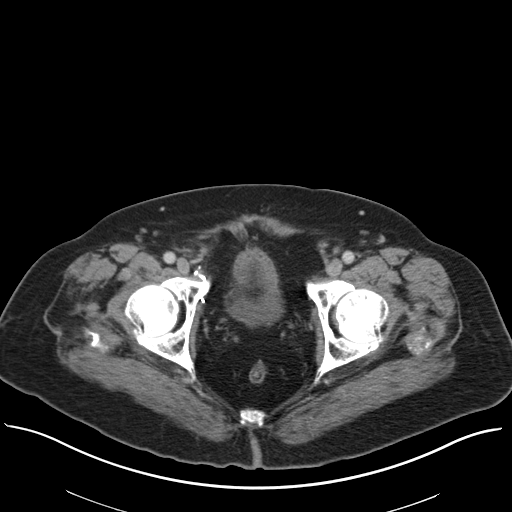
[im 26/96  soft-tissue]
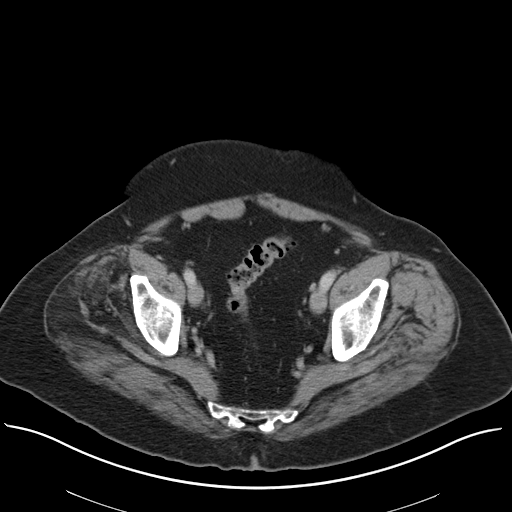
[im 32/96  soft-tissue]
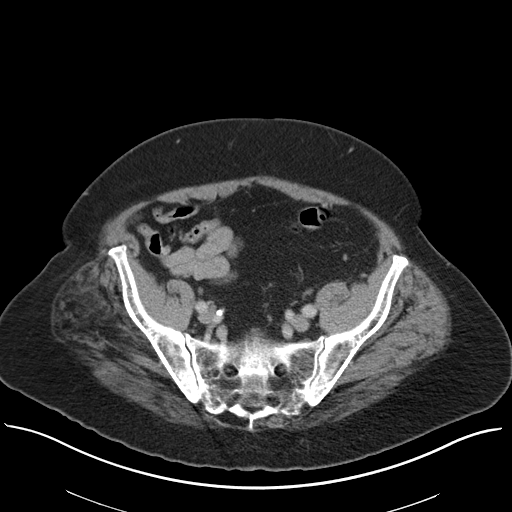
[im 39/96  soft-tissue]
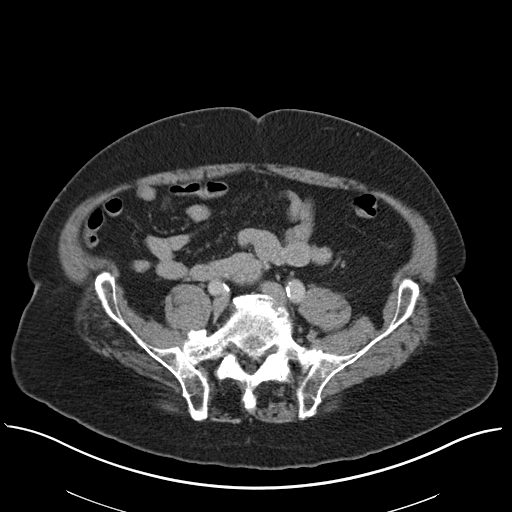
[im 45/96  soft-tissue]
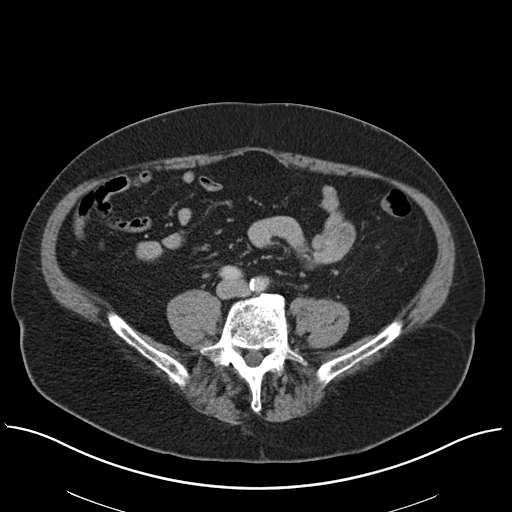
[im 51/96  soft-tissue]
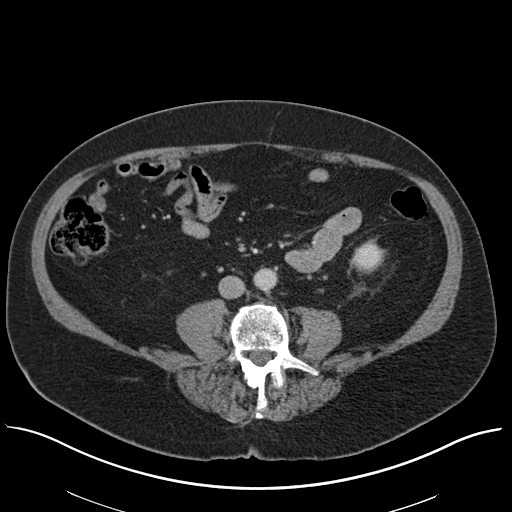
[im 58/96  soft-tissue]
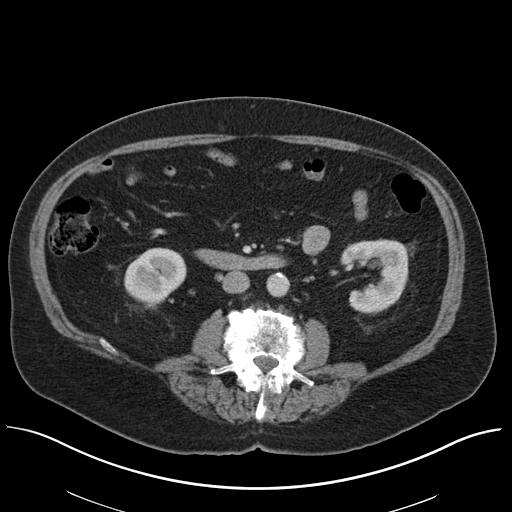
[im 58/96  bone]
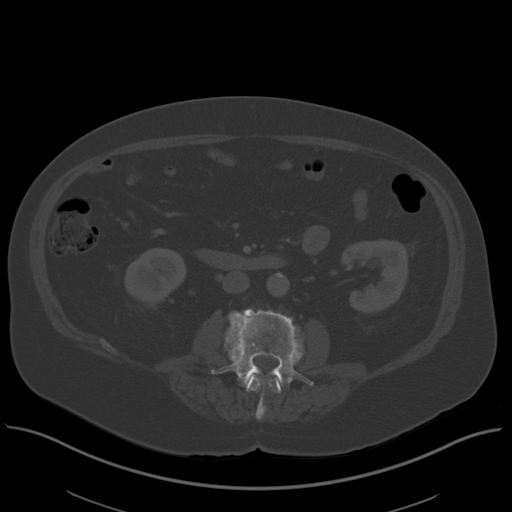
[im 64/96  soft-tissue]
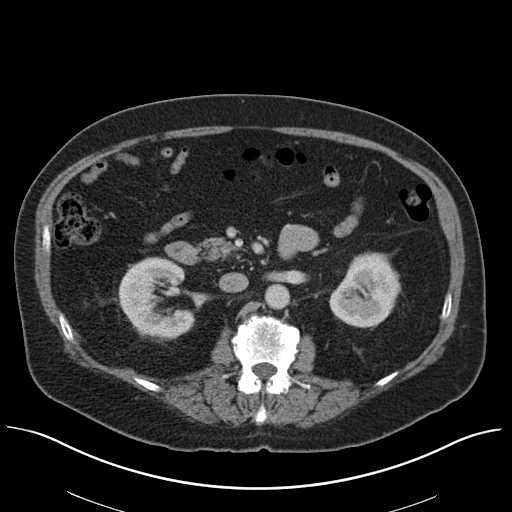
[im 70/96  soft-tissue]
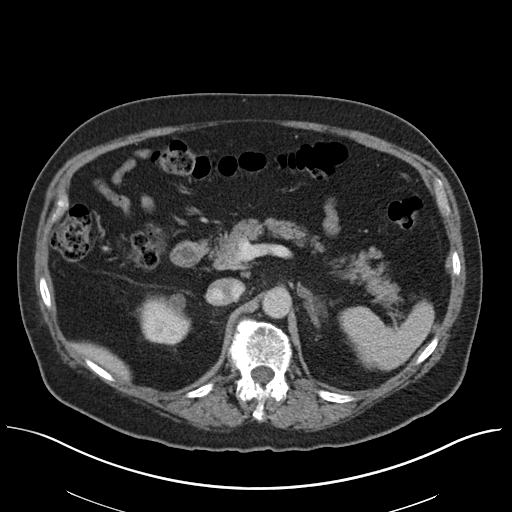
[im 77/96  soft-tissue]
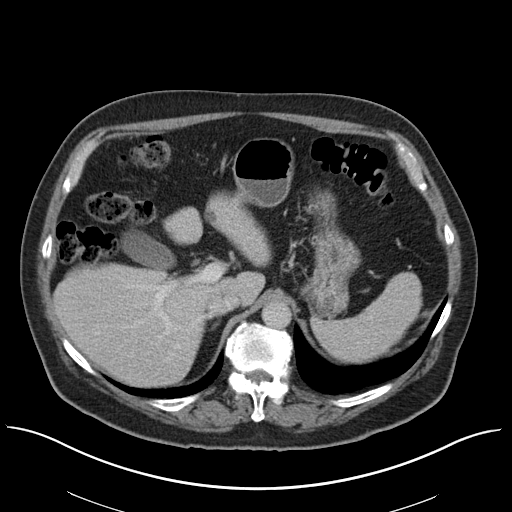
[im 83/96  soft-tissue]
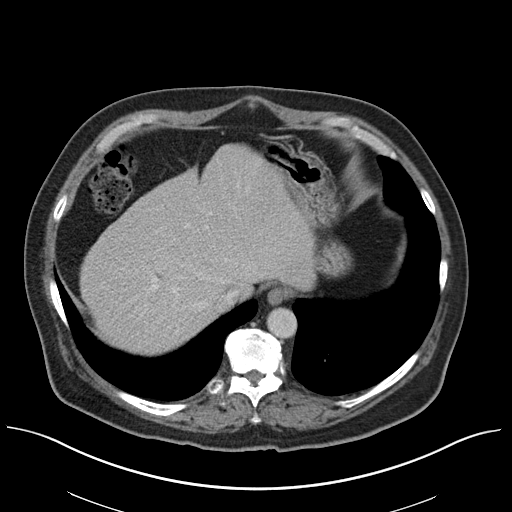
[im 89/96  soft-tissue]
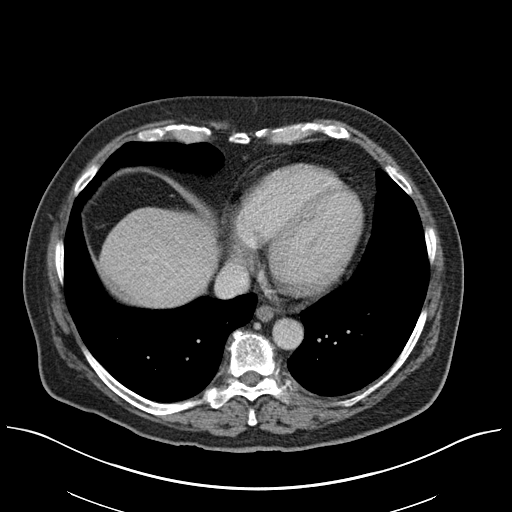

[Series 4: coronal st · coronal · 0.90mm/px · 3 of 151 slices shown]
[im 51/151  soft-tissue]
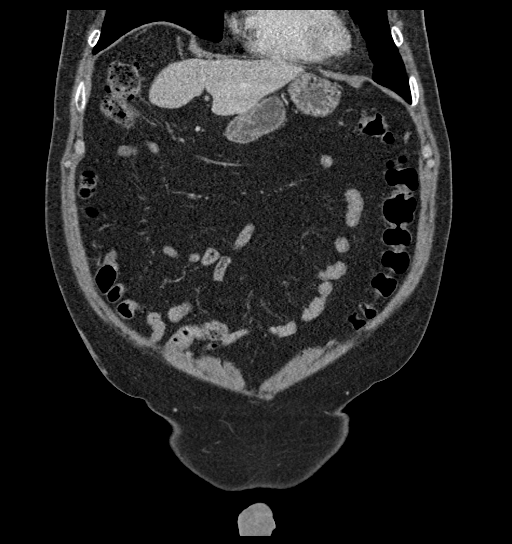
[im 67/151  soft-tissue]
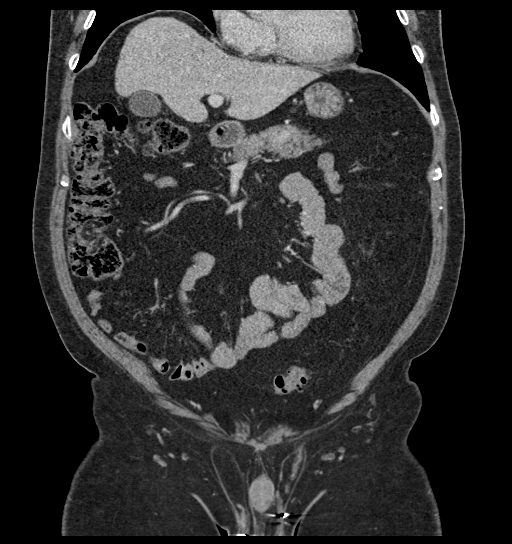
[im 84/151  soft-tissue]
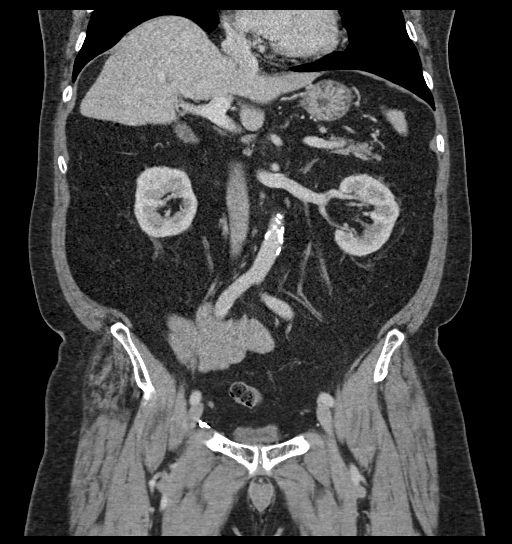

[17 of 46 positions shown; findings below may reference images not displayed]

FINDINGS: Lower chest: No acute abnormality.

Hepatobiliary: No focal liver abnormality is seen. No gallstones,
gallbladder wall thickening, or biliary dilatation.

Pancreas: Unremarkable. No pancreatic ductal dilatation or
surrounding inflammatory changes.

Spleen: Normal in size without focal abnormality.

Adrenals/Urinary Tract: Mild thickening of the left adrenal gland
similar to previous study. Right adrenal gland is normal. 1.5 cm
exophytic cyst at the upper pole the right kidney. Kidneys appear
otherwise normal. No nephrolithiasis or hydronephrosis visualized.
Urinary bladder is within normal limits.

Stomach/Bowel: Stomach is within normal limits. Appendix appears
normal. No evidence of bowel wall thickening, distention, or
inflammatory changes.

Vascular/Lymphatic: Aortic atherosclerosis. No enlarged abdominal or
pelvic lymph nodes.

Reproductive: Prostatectomy surgical changes.

Other: No abdominal wall hernia or abnormality. No abdominopelvic
ascites.

Musculoskeletal: No suspicious bony lesions identified. Advanced
degenerative changes of the lumbar spine. Note is made of asymmetric
fatty atrophy of the right gluteal muscles.
IMPRESSION: 1. No acute process identified.
2. Prostatectomy changes.
3. Other chronic findings as described.

## 2022-08-25 DIAGNOSIS — E7849 Other hyperlipidemia: Secondary | ICD-10-CM | POA: Diagnosis not present

## 2022-08-25 DIAGNOSIS — E782 Mixed hyperlipidemia: Secondary | ICD-10-CM | POA: Diagnosis not present

## 2022-08-25 DIAGNOSIS — K2100 Gastro-esophageal reflux disease with esophagitis, without bleeding: Secondary | ICD-10-CM | POA: Diagnosis not present

## 2022-08-25 DIAGNOSIS — I10 Essential (primary) hypertension: Secondary | ICD-10-CM | POA: Diagnosis not present

## 2022-09-01 DIAGNOSIS — E7849 Other hyperlipidemia: Secondary | ICD-10-CM | POA: Diagnosis not present

## 2022-09-01 DIAGNOSIS — T466X5A Adverse effect of antihyperlipidemic and antiarteriosclerotic drugs, initial encounter: Secondary | ICD-10-CM | POA: Diagnosis not present

## 2022-09-01 DIAGNOSIS — Z23 Encounter for immunization: Secondary | ICD-10-CM | POA: Diagnosis not present

## 2022-09-01 DIAGNOSIS — F411 Generalized anxiety disorder: Secondary | ICD-10-CM | POA: Diagnosis not present

## 2022-09-01 DIAGNOSIS — F17210 Nicotine dependence, cigarettes, uncomplicated: Secondary | ICD-10-CM | POA: Diagnosis not present

## 2022-09-01 DIAGNOSIS — R5383 Other fatigue: Secondary | ICD-10-CM | POA: Diagnosis not present

## 2022-09-01 DIAGNOSIS — R000 Tachycardia, unspecified: Secondary | ICD-10-CM | POA: Diagnosis not present

## 2022-09-01 DIAGNOSIS — K2100 Gastro-esophageal reflux disease with esophagitis, without bleeding: Secondary | ICD-10-CM | POA: Diagnosis not present

## 2022-09-01 DIAGNOSIS — Z6834 Body mass index (BMI) 34.0-34.9, adult: Secondary | ICD-10-CM | POA: Diagnosis not present

## 2022-09-01 DIAGNOSIS — I10 Essential (primary) hypertension: Secondary | ICD-10-CM | POA: Diagnosis not present

## 2022-09-01 DIAGNOSIS — M609 Myositis, unspecified: Secondary | ICD-10-CM | POA: Diagnosis not present

## 2022-09-01 DIAGNOSIS — R319 Hematuria, unspecified: Secondary | ICD-10-CM | POA: Diagnosis not present

## 2022-09-01 NOTE — Progress Notes (Signed)
History of Present Illness: Philip Mcneil returns for followup of HG NMIBC as well as PCa (treated w/ RRP in 2000 as well as eventual salvage radiotherapy)  3.6.2023: Initial TURBT of a large bladder tumor.  Tumor location was right anterior/lateral bladder wall--approximately 4 cm in size. Gemcitabine placed postoperatively.   Path--HG NMIBC   5.18.2023: Repeat resection.  Pathology all benign.  6.23.2023: Here for follow-up.  Still with troublesome urinary leakage.  This is been a bother since his first TURBT in March.  Most of his urine comes out in his diaper.  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  8.3.2023: Completed 6th induction BCG Rx   9.26.2023: Cysto negative   10.31.2023: Finished 1st round of BCG maintenance.   1.23.2024: Cystoscopy negative, cytology atypical cells  2.27.2024: Completed second round of BCG maintenance  4.16.2024: He is here today for cystoscopy.  Still having leakage.  No gross hematuria though.  He tolerates the BCG well.  Past Medical History:  Diagnosis Date   Anemia    Bladder cancer [facility])    urologist-- dr Retta Diones   Chronic constipation    GAD (generalized anxiety disorder)    GERD (gastroesophageal reflux disease)    History of atrial fibrillation    ED visit in recovery post tympanoplasty [phone] with brief episode atrial fib, per note cardiology recommended pt follow up with pcp   History of CVA (cerebrovascular accident) without residual deficits 04/2021   hospital admission ([facility]) 04-29-2021 dx orthostatic hypotension, pneumonia, dehydration, generalized weakness, stroke;  per MRI in care everywhere small chronic cortical infarct within left parietal lobe postcenral gyrus   History of external beam radiation therapy 2000   salvage post  prostatectomy   History of prostate cancer 2000   followed by dr Retta Diones;   s/p  radical prostatectomy and salvage radiation in 2000   Hypertension    followed by pcp;;  (hx admission [facility] 2012 had normal nuclear stress test & echo 08-19-2010 in epic) ;;   previously seen by cardiology , dr Erroll Luna note in epic 06-15-2017   Lower urinary tract symptoms (LUTS)    OA (osteoarthritis)    Retinal cyst of right eye    per pt w/ floaters   Strabismic amblyopia of left eye    per pt w/ decreased vision , stated 20/ 200   Tachycardia    Urinary incontinence     Past Surgical History:  Procedure Laterality Date   CATARACT EXTRACTION W/ INTRAOCULAR LENS IMPLANT Bilateral    2020   CYSTOSCOPY W/ RETROGRADES Bilateral 07/22/2021   Procedure: CYSTOSCOPY WITH RETROGRADE PYELOGRAM;  Surgeon: Marcine Matar, MD;  Location: [facility];  Service: Urology;  Laterality: Bilateral;   INSERTION OF MESH N/A 11/23/2013   Procedure: INSERTION OF MESH;  Surgeon: Dalia Heading, MD;  Location: [facility];  Service: General;  Laterality: N/A;   KNEE ARTHROSCOPY Right    unsure date   LUMBAR SPINE SURGERY     x2  in 1980s   PERCUTANEOUS PINNING Right    age 72;    right foot fracture   POSTERIOR CERVICAL LAMINECTOMY     1990s   RETROPUBIC PROSTATECTOMY  2000   @[facility] :   per pt had Right inguinal hernia repair   ROTATOR CUFF REPAIR Right 2004   STRABISMUS  SURGERY Left    age 19 and 31   THUMB AMPUTATION Left 2003   revision of amputation injusry   TOTAL KNEE ARTHROPLASTY Left 09/25/2020   Procedure: TOTAL KNEE ARTHROPLASTY;  Surgeon: Durene Romans, MD;  Location: [facility];  Service: Orthopedics;  Laterality: Left;  70 mins   TRANSURETHRAL RESECTION OF BLADDER TUMOR N/A 10/03/2021   Procedure: REPEAT TRANSURETHRAL RESECTION OF BLADDER TUMOR (TURBT);  Surgeon: Marcine Matar, MD;  Location: [facility];  Service: Urology;  Laterality: N/A;  45 MINS   TRANSURETHRAL  RESECTION OF BLADDER TUMOR WITH MITOMYCIN-C N/A 07/22/2021   Procedure: TRANSURETHRAL RESECTION OF BLADDER TUMOR WITH GEMCITABINE;  Surgeon: Marcine Matar, MD;  Location: [facility];  Service: Urology;  Laterality: N/A;   TYMPANOPLASTY Left 02/22/2018   Procedure: TYMPANOPLASTY;  Surgeon: Serena Colonel, MD;  Location: [facility];  Service: ENT;  Laterality: Left;   UMBILICAL HERNIA REPAIR N/A 11/23/2013   Procedure: UMBILICAL HERNIORRHAPHY;  Surgeon: Dalia Heading, MD;  Location: [facility];  Service: General;  Laterality: N/A;    Home Medications:  Allergies as of 09/02/2022       Reactions   Morphine And Related Anaphylaxis   Oxycodone Other (See Comments)   Itchy / jumpy (Percocet)        Medication List        Accurate as of September 01, 2022  8:11 PM. If you have any questions, ask your nurse or doctor.          acetaminophen 500 MG tablet Commonly known as: TYLENOL Take 1,000 mg by mouth as needed for mild pain.   ALPRAZolam 0.5 MG tablet Commonly known as: XANAX Take 0.5 mg by mouth 3 (three) times daily as needed for anxiety.   losartan 25 MG tablet Commonly known as: COZAAR Take 25 mg by mouth daily.   Loteprednol Etabonate 0.5 % Gel Place 1 drop into the right eye daily.   Myrbetriq 25 MG Tb24 tablet Generic drug: mirabegron ER   oxybutynin 5 MG tablet Commonly known as: DITROPAN Take 5 mg by mouth every 8 (eight) hours as needed for bladder spasms.   pantoprazole 40 MG tablet Commonly known as: PROTONIX Take 40 mg by mouth daily.   Prolensa 0.07 % Soln Generic drug: Bromfenac Sodium Place 1 drop into the right eye at bedtime.        Allergies:  Allergies  Allergen Reactions   Morphine And Related Anaphylaxis   Oxycodone Other (See Comments)    Itchy / jumpy (Percocet)    Family History  Problem Relation Age of Onset   Alzheimer's disease Mother    Alzheimer's disease Father    Prostate cancer Father     Alzheimer's disease Paternal Grandfather    Allergic rhinitis Neg Hx    Asthma Neg Hx    Eczema Neg Hx    Immunodeficiency Neg Hx    Urticaria Neg Hx     Social History:  reports that he quit smoking about 18 years ago. His smoking use included cigarettes. He has a 5.00 pack-year smoking history. He has never used smokeless tobacco. He reports that he does not drink alcohol and does not use drugs.  ROS: A complete review of systems was performed.  All systems are negative except for pertinent findings as noted.  Physical Exam:  Vital signs in last 24 hours: There were no vitals taken for this visit. Constitutional:  Alert and oriented, No acute distress Cardiovascular: Regular rate  Respiratory: Normal respiratory effort GI: Abdomen is soft, nontender, nondistended, no abdominal masses. No CVAT.  Genitourinary: Normal male phallus, testes are descended bilaterally and non-tender and without masses, scrotum is normal in appearance without lesions or masses, perineum is normal on inspection. Lymphatic: No lymphadenopathy Neurologic: Grossly intact, no focal deficits Psychiatric: Normal mood and affect  I have reviewed prior pt notes  I have reviewed notes from referring/previous physicians  I have reviewed urinalysis results  I have independently reviewed prior imaging  I have reviewed prior PSA and pathology results  Cystoscopy Procedure Note:  Indication: Bladder cancer surveillance  After informed consent and discussion of the procedure and its risks, Philip Mcneil was positioned and prepped in the standard fashion.  Cystoscopy was performed with a flexible cystoscope.   Findings: Urethra: No stricture or lesion Prostate: Absent Bladder neck: Open Ureteral orifices: Normal bilaterally Bladder: I did not see any urothelial lesions.  The patient tolerated the procedure well.    Impression/Assessment:  History of prostate cancer, status post RRP/salvage radiotherapy  with continued excellent PSA response  High-grade nonmuscle invasive bladder cancer, resected initially over a year ago.  No evidence of recurrence thus far, getting immune therapy with BCG  Plan:  I will have him come in in the next month or 2 for 3 weekly BCG maintenance treatments  I will have him come back in about 4 months for next cystoscopy

## 2022-09-02 ENCOUNTER — Ambulatory Visit (INDEPENDENT_AMBULATORY_CARE_PROVIDER_SITE_OTHER): Payer: Medicare Other | Admitting: Urology

## 2022-09-02 ENCOUNTER — Encounter: Payer: Self-pay | Admitting: Urology

## 2022-09-02 ENCOUNTER — Other Ambulatory Visit: Payer: Self-pay | Admitting: Urology

## 2022-09-02 VITALS — BP 137/83 | HR 87 | Ht 68.0 in | Wt 221.0 lb

## 2022-09-02 DIAGNOSIS — Z8546 Personal history of malignant neoplasm of prostate: Secondary | ICD-10-CM

## 2022-09-02 DIAGNOSIS — R8289 Other abnormal findings on cytological and histological examination of urine: Secondary | ICD-10-CM | POA: Diagnosis not present

## 2022-09-02 DIAGNOSIS — C678 Malignant neoplasm of overlapping sites of bladder: Secondary | ICD-10-CM | POA: Diagnosis not present

## 2022-09-02 DIAGNOSIS — Z08 Encounter for follow-up examination after completed treatment for malignant neoplasm: Secondary | ICD-10-CM

## 2022-09-02 DIAGNOSIS — Z8551 Personal history of malignant neoplasm of bladder: Secondary | ICD-10-CM | POA: Diagnosis not present

## 2022-09-02 LAB — URINALYSIS, ROUTINE W REFLEX MICROSCOPIC
Bilirubin, UA: NEGATIVE
Glucose, UA: NEGATIVE
Ketones, UA: NEGATIVE
Leukocytes,UA: NEGATIVE
Nitrite, UA: NEGATIVE
Protein,UA: NEGATIVE
RBC, UA: NEGATIVE
Specific Gravity, UA: 1.015 (ref 1.005–1.030)
Urobilinogen, Ur: 0.2 mg/dL (ref 0.2–1.0)
pH, UA: 7 (ref 5.0–7.5)

## 2022-09-02 MED ORDER — CIPROFLOXACIN HCL 500 MG PO TABS
500.0000 mg | ORAL_TABLET | Freq: Once | ORAL | Status: AC
  Administered 2022-09-02: 500 mg via ORAL

## 2022-09-02 NOTE — Addendum Note (Signed)
Addended by: Christoper Fabian R on: 09/02/2022 12:58 PM   Modules accepted: Orders

## 2022-09-03 LAB — PSA: Prostate Specific Ag, Serum: <0.1 ng/mL (ref 0.0–4.0)

## 2022-09-05 LAB — NON-GYN REPORT

## 2022-09-10 ENCOUNTER — Telehealth: Payer: Self-pay

## 2022-09-10 NOTE — Telephone Encounter (Signed)
-----   Message from Marcine Matar, MD sent at 09/09/2022  8:25 AM EDT ----- Please notify patient cytology did not show any cancer cells, only few atypical cells.  PSA was still 0.  Keep scheduled BCG and cystoscopy follow-up. ----- Message ----- From: Troy Sine, CMA Sent: 09/08/2022   8:22 AM EDT To: Marcine Matar, MD  Please review

## 2022-09-10 NOTE — Telephone Encounter (Signed)
Patient is aware of Dr. Retta Diones recommendation and patient voiced understanding

## 2022-10-09 ENCOUNTER — Ambulatory Visit (INDEPENDENT_AMBULATORY_CARE_PROVIDER_SITE_OTHER): Payer: Medicare Other

## 2022-10-09 DIAGNOSIS — C679 Malignant neoplasm of bladder, unspecified: Secondary | ICD-10-CM | POA: Diagnosis not present

## 2022-10-09 DIAGNOSIS — Z8546 Personal history of malignant neoplasm of prostate: Secondary | ICD-10-CM

## 2022-10-09 LAB — URINALYSIS, ROUTINE W REFLEX MICROSCOPIC
Bilirubin, UA: NEGATIVE
Glucose, UA: NEGATIVE
Ketones, UA: NEGATIVE
Leukocytes,UA: NEGATIVE
Nitrite, UA: NEGATIVE
Protein,UA: NEGATIVE
Specific Gravity, UA: 1.01 (ref 1.005–1.030)
Urobilinogen, Ur: 0.2 mg/dL (ref 0.2–1.0)
pH, UA: 7 (ref 5.0–7.5)

## 2022-10-09 MED ORDER — BCG LIVE 50 MG IS SUSR
3.2400 mL | Freq: Once | INTRAVESICAL | Status: AC
Start: 2022-10-09 — End: 2022-10-09
  Administered 2022-10-09: 81 mg via INTRAVESICAL

## 2022-10-09 NOTE — Progress Notes (Addendum)
BCG Bladder Instillation  BCG # 1 of 3  Due to Bladder Cancer patient is present today for a BCG treatment. Patient was cleaned and prepped in a sterile fashion with betadine. A 16 FR foley catheter was inserted, urine return was noted 10 ml, urine was yellow in color.  50ml of reconstituted BCG was instilled into the bladder and held  in office for . The catheter was then removed. Patient tolerated well, no complications were noted  Performed by: Kennyth Lose, CMA  Follow up/ Additional notes: keep nurse visit

## 2022-10-16 ENCOUNTER — Ambulatory Visit (INDEPENDENT_AMBULATORY_CARE_PROVIDER_SITE_OTHER): Payer: Medicare Other

## 2022-10-16 DIAGNOSIS — C679 Malignant neoplasm of bladder, unspecified: Secondary | ICD-10-CM

## 2022-10-16 DIAGNOSIS — Z8546 Personal history of malignant neoplasm of prostate: Secondary | ICD-10-CM

## 2022-10-16 LAB — URINALYSIS, ROUTINE W REFLEX MICROSCOPIC
Bilirubin, UA: NEGATIVE
Glucose, UA: NEGATIVE
Ketones, UA: NEGATIVE
Nitrite, UA: NEGATIVE
Specific Gravity, UA: 1.015 (ref 1.005–1.030)
Urobilinogen, Ur: 0.2 mg/dL (ref 0.2–1.0)
pH, UA: 7 (ref 5.0–7.5)

## 2022-10-16 MED ORDER — BCG LIVE 50 MG IS SUSR
3.2400 mL | Freq: Once | INTRAVESICAL | Status: AC
Start: 2022-10-16 — End: 2022-10-16
  Administered 2022-10-16: 81 mg via INTRAVESICAL

## 2022-10-16 NOTE — Progress Notes (Signed)
BCG Bladder Instillation  BCG # 1 of 3  Due to Bladder Cancer patient is present today for a BCG treatment. Patient was cleaned and prepped in a sterile fashion with betadine. A 16 FR foley catheter was inserted, urine return was noted 10 ml, urine was yellow in color.  50ml of reconstituted BCG was instilled into the bladder and held  in office for . The catheter was then removed. Patient tolerated well, no complications were noted  Performed by: Kaydenn Mclear, CMA  Follow up/ Additional notes: keep nurse visit   

## 2022-10-16 NOTE — Progress Notes (Addendum)
BCG Bladder Instillation  BCG # 2 of 3  Due to Bladder Cancer patient is present today for a BCG treatment. Patient was cleaned and prepped in a sterile fashion with betadine. A 16 FR catheter was inserted, urine return was noted 1 ml, urine was yellow in color.  50 ml of reconstituted BCG was instilled into the bladder. The catheter was then removed. Patient tolerated well, no complications were noted  Performed by: Kennyth Lose, CMA  Follow up/ Additional notes: Keep scheduled nurse visit

## 2022-10-23 ENCOUNTER — Ambulatory Visit (INDEPENDENT_AMBULATORY_CARE_PROVIDER_SITE_OTHER): Payer: Medicare Other

## 2022-10-23 DIAGNOSIS — C679 Malignant neoplasm of bladder, unspecified: Secondary | ICD-10-CM | POA: Diagnosis not present

## 2022-10-23 DIAGNOSIS — Z8546 Personal history of malignant neoplasm of prostate: Secondary | ICD-10-CM

## 2022-10-23 LAB — URINALYSIS, ROUTINE W REFLEX MICROSCOPIC
Bilirubin, UA: NEGATIVE
Glucose, UA: NEGATIVE
Ketones, UA: NEGATIVE
Leukocytes,UA: NEGATIVE
Nitrite, UA: NEGATIVE
Protein,UA: NEGATIVE
Specific Gravity, UA: 1.01 (ref 1.005–1.030)
Urobilinogen, Ur: 0.2 mg/dL (ref 0.2–1.0)
pH, UA: 7 (ref 5.0–7.5)

## 2022-10-23 MED ORDER — BCG LIVE 50 MG IS SUSR
3.2400 mL | Freq: Once | INTRAVESICAL | Status: AC
Start: 2022-10-23 — End: 2022-10-23
  Administered 2022-10-23: 81 mg via INTRAVESICAL

## 2022-10-23 NOTE — Progress Notes (Signed)
BCG Bladder Instillation  BCG # 3 of 3  Due to Bladder Cancer patient is present today for a BCG treatment. Patient was cleaned and prepped in a sterile fashion with betadine. A 16FR catheter was inserted, urine return was noted , urine was yellow in color.  50ml of reconstituted BCG was instilled into the bladder. Foley catheter was capped. Patient was only able to hold BCG in for 25 minutes. Patients bladder was drained of 150 mls. The catheter was then removed. Patient tolerated well, no complications were noted  Performed by: Aidan Caloca LPN  Follow up/ Additional notes: keep scheduled OV

## 2022-11-28 ENCOUNTER — Encounter (INDEPENDENT_AMBULATORY_CARE_PROVIDER_SITE_OTHER): Payer: Medicare Other | Admitting: Ophthalmology

## 2022-11-28 DIAGNOSIS — I10 Essential (primary) hypertension: Secondary | ICD-10-CM

## 2022-11-28 DIAGNOSIS — H35373 Puckering of macula, bilateral: Secondary | ICD-10-CM

## 2022-11-28 DIAGNOSIS — H59031 Cystoid macular edema following cataract surgery, right eye: Secondary | ICD-10-CM | POA: Diagnosis not present

## 2022-11-28 DIAGNOSIS — H35033 Hypertensive retinopathy, bilateral: Secondary | ICD-10-CM

## 2022-11-28 DIAGNOSIS — H43813 Vitreous degeneration, bilateral: Secondary | ICD-10-CM

## 2022-12-03 DIAGNOSIS — Z85828 Personal history of other malignant neoplasm of skin: Secondary | ICD-10-CM | POA: Diagnosis not present

## 2022-12-03 DIAGNOSIS — D485 Neoplasm of uncertain behavior of skin: Secondary | ICD-10-CM | POA: Diagnosis not present

## 2022-12-03 DIAGNOSIS — L570 Actinic keratosis: Secondary | ICD-10-CM | POA: Diagnosis not present

## 2023-01-05 NOTE — Progress Notes (Signed)
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  Tumor location was right anterior/lateral bladder wall--approximately 4 cm in size. Gemcitabine placed postoperatively.   Path--HG NMIBC   5.18.2023: Repeat resection.  Pathology all benign.  6.23.2023: Here for follow-up.  Still with troublesome urinary leakage.  This is been a bother since his first TURBT in March.  Most of his urine comes out in his diaper.  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  8.3.2023: Completed 6th induction BCG Rx   9.26.2023: Cysto negative   10.31.2023: Finished 1st round of BCG maintenance.   1.23.2024: Cystoscopy negative, cytology atypical cells  2.27.2024: Completed second round of BCG maintenance  4.16.2024: Cysto negative, cytology atypical cells.   6.6.2024: Completed 3rd BCG maintenance.  8.20.2024: Past Medical History:  Diagnosis Date   Anemia    Bladder cancer [facility])    urologist-- dr Retta Diones   Chronic constipation    GAD (generalized anxiety disorder)    GERD (gastroesophageal reflux disease)    History of atrial fibrillation    ED visit in recovery post tympanoplasty [phone] with brief episode atrial fib, per note cardiology recommended pt follow up with pcp   History of CVA (cerebrovascular accident) without residual deficits 04/2021   hospital admission ([facility]) 04-29-2021 dx orthostatic hypotension, pneumonia, dehydration, generalized weakness, stroke;  per MRI in care everywhere small chronic cortical infarct within left parietal lobe postcenral gyrus   History of external beam radiation therapy 2000   salvage post prostatectomy    History of prostate cancer 2000   followed by dr Retta Diones;   s/p  radical prostatectomy and salvage radiation in 2000   Hypertension    followed by pcp;;  (hx admission [facility] 2012 had normal nuclear stress test & echo 08-19-2010 in epic) ;;   previously seen by cardiology , dr Erroll Luna note in epic 06-15-2017   Lower urinary tract symptoms (LUTS)    OA (osteoarthritis)    Retinal cyst of right eye    per pt w/ floaters   Strabismic amblyopia of left eye    per pt w/ decreased vision , stated 20/ 200   Tachycardia    Urinary incontinence     Past Surgical History:  Procedure Laterality Date   CATARACT EXTRACTION W/ INTRAOCULAR LENS IMPLANT Bilateral    2020   CYSTOSCOPY W/ RETROGRADES Bilateral 07/22/2021   Procedure: CYSTOSCOPY WITH RETROGRADE PYELOGRAM;  Surgeon: Marcine Matar, MD;  Location: [facility];  Service: Urology;  Laterality: Bilateral;   INSERTION OF MESH N/A 11/23/2013   Procedure: INSERTION OF MESH;  Surgeon: Dalia Heading, MD;  Location: [facility];  Service: General;  Laterality: N/A;   KNEE ARTHROSCOPY Right    unsure date   LUMBAR SPINE SURGERY     x2  in 1980s   PERCUTANEOUS PINNING Right    age 72;    right foot fracture   POSTERIOR CERVICAL LAMINECTOMY     1990s   RETROPUBIC PROSTATECTOMY  2000   @[facility] :   per pt had Right inguinal hernia repair   ROTATOR CUFF REPAIR Right 2004   STRABISMUS SURGERY Left    age 72 and 72  THUMB AMPUTATION Left 2003   revision of amputation injusry   TOTAL KNEE ARTHROPLASTY Left 09/25/2020   Procedure: TOTAL KNEE ARTHROPLASTY;  Surgeon: Durene Romans, MD;  Location: [facility];  Service: Orthopedics;  Laterality: Left;  70 mins   TRANSURETHRAL RESECTION OF BLADDER TUMOR N/A 10/03/2021   Procedure: REPEAT TRANSURETHRAL RESECTION OF BLADDER TUMOR (TURBT);  Surgeon: Marcine Matar, MD;  Location: [facility];  Service: Urology;  Laterality: N/A;  45 MINS   TRANSURETHRAL RESECTION OF  BLADDER TUMOR WITH MITOMYCIN-C N/A 07/22/2021   Procedure: TRANSURETHRAL RESECTION OF BLADDER TUMOR WITH GEMCITABINE;  Surgeon: Marcine Matar, MD;  Location: [facility];  Service: Urology;  Laterality: N/A;   TYMPANOPLASTY Left 02/22/2018   Procedure: TYMPANOPLASTY;  Surgeon: Serena Colonel, MD;  Location: [facility];  Service: ENT;  Laterality: Left;   UMBILICAL HERNIA REPAIR N/A 11/23/2013   Procedure: UMBILICAL HERNIORRHAPHY;  Surgeon: Dalia Heading, MD;  Location: [facility];  Service: General;  Laterality: N/A;    Home Medications:  Allergies as of 01/06/2023       Reactions   Morphine And Codeine Anaphylaxis   Oxycodone Other (See Comments)   Itchy / jumpy (Percocet)        Medication List        Accurate as of January 05, 2023 12:31 PM. If you have any questions, ask your nurse or doctor.          acetaminophen 500 MG tablet Commonly known as: TYLENOL Take 1,000 mg by mouth as needed for mild pain.   ALPRAZolam 0.5 MG tablet Commonly known as: XANAX Take 0.5 mg by mouth 3 (three) times daily as needed for anxiety.   cetirizine 10 MG tablet Commonly known as: ZYRTEC Take by mouth.   ezetimibe 10 MG tablet Commonly known as: ZETIA Take 10 mg by mouth daily.   fluticasone 50 MCG/ACT nasal spray Commonly known as: FLONASE 2 sprays.   losartan 25 MG tablet Commonly known as: COZAAR Take 25 mg by mouth daily.   Loteprednol Etabonate 0.5 % Gel Place 1 drop into the right eye daily.   Myrbetriq 25 MG Tb24 tablet Generic drug: mirabegron ER   oxybutynin 5 MG tablet Commonly known as: DITROPAN Take 5 mg by mouth every 8 (eight) hours as needed for bladder spasms.   pantoprazole 40 MG tablet Commonly known as: PROTONIX Take 40 mg by mouth daily.   Prolensa 0.07 % Soln Generic drug: Bromfenac Sodium Place 1 drop into the right eye at bedtime.        Allergies:  Allergies  Allergen Reactions   Morphine And Codeine  Anaphylaxis   Oxycodone Other (See Comments)    Itchy / jumpy (Percocet)    Family History  Problem Relation Age of Onset   Alzheimer's disease Mother    Alzheimer's disease Father    Prostate cancer Father    Alzheimer's disease Paternal Grandfather    Allergic rhinitis Neg Hx    Asthma Neg Hx    Eczema Neg Hx    Immunodeficiency Neg Hx    Urticaria Neg Hx     Social History:  reports that he quit smoking about 19 years ago. His smoking use included cigarettes. He started smoking about 39 years ago. He has a 5 pack-year smoking history. He has never used smokeless tobacco. He reports that he does not drink alcohol and does not use drugs.  ROS: A complete review of systems was performed.  All systems  are negative except for pertinent findings as noted.  Physical Exam:  Vital signs in last 24 hours: There were no vitals taken for this visit. Constitutional:  Alert and oriented, No acute distress Cardiovascular: Regular rate  Respiratory: Normal respiratory effort GI: Abdomen is soft, nontender, nondistended, no abdominal masses. No CVAT.  Genitourinary: Normal male phallus, testes are descended bilaterally and non-tender and without masses, scrotum is normal in appearance without lesions or masses, perineum is normal on inspection. Lymphatic: No lymphadenopathy Neurologic: Grossly intact, no focal deficits Psychiatric: Normal mood and affect  I have reviewed prior pt notes  I have reviewed notes from referring/previous physicians  I have reviewed urinalysis results  I have independently reviewed prior imaging  I have reviewed prior PSA results  I have reviewed prior urine culture   Impression/Assessment:  ***  Plan:  ***

## 2023-01-06 ENCOUNTER — Ambulatory Visit (INDEPENDENT_AMBULATORY_CARE_PROVIDER_SITE_OTHER): Payer: Medicare Other | Admitting: Urology

## 2023-01-06 ENCOUNTER — Encounter: Payer: Self-pay | Admitting: Urology

## 2023-01-06 VITALS — BP 135/78 | HR 99

## 2023-01-06 DIAGNOSIS — Z8551 Personal history of malignant neoplasm of bladder: Secondary | ICD-10-CM

## 2023-01-06 DIAGNOSIS — Z8546 Personal history of malignant neoplasm of prostate: Secondary | ICD-10-CM | POA: Diagnosis not present

## 2023-01-06 DIAGNOSIS — Z08 Encounter for follow-up examination after completed treatment for malignant neoplasm: Secondary | ICD-10-CM

## 2023-01-06 DIAGNOSIS — R8289 Other abnormal findings on cytological and histological examination of urine: Secondary | ICD-10-CM | POA: Diagnosis not present

## 2023-01-06 DIAGNOSIS — C678 Malignant neoplasm of overlapping sites of bladder: Secondary | ICD-10-CM

## 2023-01-06 LAB — URINALYSIS, ROUTINE W REFLEX MICROSCOPIC
Bilirubin, UA: NEGATIVE
Glucose, UA: NEGATIVE
Ketones, UA: NEGATIVE
Nitrite, UA: NEGATIVE
Specific Gravity, UA: 1.02 (ref 1.005–1.030)
Urobilinogen, Ur: 1 mg/dL (ref 0.2–1.0)
pH, UA: 7 (ref 5.0–7.5)

## 2023-01-06 LAB — MICROSCOPIC EXAMINATION: Bacteria, UA: NONE SEEN

## 2023-01-06 MED ORDER — CIPROFLOXACIN HCL 500 MG PO TABS
500.0000 mg | ORAL_TABLET | Freq: Once | ORAL | Status: AC
Start: 2023-01-06 — End: 2023-01-06
  Administered 2023-01-06: 500 mg via ORAL

## 2023-01-07 LAB — CYTOLOGY, URINE

## 2023-01-27 ENCOUNTER — Other Ambulatory Visit: Payer: Self-pay | Admitting: Urology

## 2023-01-27 ENCOUNTER — Telehealth: Payer: Self-pay

## 2023-01-27 DIAGNOSIS — Z23 Encounter for immunization: Secondary | ICD-10-CM | POA: Diagnosis not present

## 2023-01-27 DIAGNOSIS — C678 Malignant neoplasm of overlapping sites of bladder: Secondary | ICD-10-CM

## 2023-01-27 NOTE — Telephone Encounter (Signed)
-----   Message from Bertram Millard Dahlstedt sent at 01/27/2023  9:41 AM EDT ----- Call patient with results of cytology.  I put orders in for a basic metabolic panel to be done before CT scan.  Please get that scheduled. ----- Message ----- From: Nell Range Lab Results In Sent: 01/06/2023   3:36 PM EDT To: Marcine Matar, MD

## 2023-01-27 NOTE — Telephone Encounter (Signed)
Patient is made aware and voiced understanding. 

## 2023-02-03 ENCOUNTER — Other Ambulatory Visit: Payer: Medicare Other

## 2023-02-03 DIAGNOSIS — C678 Malignant neoplasm of overlapping sites of bladder: Secondary | ICD-10-CM | POA: Diagnosis not present

## 2023-02-05 ENCOUNTER — Telehealth: Payer: Self-pay

## 2023-02-05 NOTE — Telephone Encounter (Signed)
Patient is made aware and voiced understanding.

## 2023-02-05 NOTE — Telephone Encounter (Signed)
-----   Message from Bertram Millard Dahlstedt sent at 02/05/2023  9:08 AM EDT ----- Notify patient kidney function is stable for CT scan. ----- Message ----- From: Nell Range Lab Results In Sent: 02/04/2023   5:38 AM EDT To: Marcine Matar, MD

## 2023-02-10 ENCOUNTER — Ambulatory Visit (HOSPITAL_COMMUNITY)
Admission: RE | Admit: 2023-02-10 | Discharge: 2023-02-10 | Disposition: A | Discharge status: Home / Self Care | Payer: Medicare Other | Source: Ambulatory Visit | Attending: Urology | Admitting: Urology

## 2023-02-10 DIAGNOSIS — C678 Malignant neoplasm of overlapping sites of bladder: Secondary | ICD-10-CM | POA: Insufficient documentation

## 2023-02-10 DIAGNOSIS — C679 Malignant neoplasm of bladder, unspecified: Secondary | ICD-10-CM | POA: Diagnosis not present

## 2023-02-10 DIAGNOSIS — N281 Cyst of kidney, acquired: Secondary | ICD-10-CM | POA: Diagnosis not present

## 2023-02-10 DIAGNOSIS — K7689 Other specified diseases of liver: Secondary | ICD-10-CM | POA: Diagnosis not present

## 2023-02-10 MED ORDER — IOHEXOL 300 MG/ML  SOLN
100.0000 mL | Freq: Once | INTRAMUSCULAR | Status: AC | PRN
  Administered 2023-02-10: 100 mL via INTRAVENOUS

## 2023-02-26 DIAGNOSIS — R5383 Other fatigue: Secondary | ICD-10-CM | POA: Diagnosis not present

## 2023-02-26 DIAGNOSIS — E7849 Other hyperlipidemia: Secondary | ICD-10-CM | POA: Diagnosis not present

## 2023-02-26 DIAGNOSIS — K2100 Gastro-esophageal reflux disease with esophagitis, without bleeding: Secondary | ICD-10-CM | POA: Diagnosis not present

## 2023-03-05 DIAGNOSIS — Z1339 Encounter for screening examination for other mental health and behavioral disorders: Secondary | ICD-10-CM | POA: Diagnosis not present

## 2023-03-05 DIAGNOSIS — Z23 Encounter for immunization: Secondary | ICD-10-CM | POA: Diagnosis not present

## 2023-03-05 DIAGNOSIS — I10 Essential (primary) hypertension: Secondary | ICD-10-CM | POA: Diagnosis not present

## 2023-03-05 DIAGNOSIS — E7849 Other hyperlipidemia: Secondary | ICD-10-CM | POA: Diagnosis not present

## 2023-03-05 DIAGNOSIS — M609 Myositis, unspecified: Secondary | ICD-10-CM | POA: Diagnosis not present

## 2023-03-05 DIAGNOSIS — R5383 Other fatigue: Secondary | ICD-10-CM | POA: Diagnosis not present

## 2023-03-05 DIAGNOSIS — H6120 Impacted cerumen, unspecified ear: Secondary | ICD-10-CM | POA: Diagnosis not present

## 2023-03-05 DIAGNOSIS — R000 Tachycardia, unspecified: Secondary | ICD-10-CM | POA: Diagnosis not present

## 2023-03-05 DIAGNOSIS — R32 Unspecified urinary incontinence: Secondary | ICD-10-CM | POA: Diagnosis not present

## 2023-03-05 DIAGNOSIS — T466X5A Adverse effect of antihyperlipidemic and antiarteriosclerotic drugs, initial encounter: Secondary | ICD-10-CM | POA: Diagnosis not present

## 2023-03-05 DIAGNOSIS — Z1331 Encounter for screening for depression: Secondary | ICD-10-CM | POA: Diagnosis not present

## 2023-03-05 DIAGNOSIS — R319 Hematuria, unspecified: Secondary | ICD-10-CM | POA: Diagnosis not present

## 2023-03-06 DIAGNOSIS — I4891 Unspecified atrial fibrillation: Secondary | ICD-10-CM | POA: Diagnosis not present

## 2023-03-06 DIAGNOSIS — E7849 Other hyperlipidemia: Secondary | ICD-10-CM | POA: Diagnosis not present

## 2023-03-06 DIAGNOSIS — D649 Anemia, unspecified: Secondary | ICD-10-CM | POA: Diagnosis not present

## 2023-03-06 DIAGNOSIS — I10 Essential (primary) hypertension: Secondary | ICD-10-CM | POA: Diagnosis not present

## 2023-03-06 DIAGNOSIS — Z0001 Encounter for general adult medical examination with abnormal findings: Secondary | ICD-10-CM | POA: Diagnosis not present

## 2023-03-09 ENCOUNTER — Telehealth: Payer: Self-pay

## 2023-03-09 NOTE — Telephone Encounter (Signed)
Patient called to see if you can review his recent CT imaging.

## 2023-03-11 NOTE — Telephone Encounter (Signed)
Patient notified via voicemail.  appt scheduled and patient informed to call back to confirm appt.

## 2023-04-08 DIAGNOSIS — K5901 Slow transit constipation: Secondary | ICD-10-CM | POA: Diagnosis not present

## 2023-04-14 ENCOUNTER — Ambulatory Visit: Payer: Medicare Other | Admitting: Urology

## 2023-04-14 VITALS — BP 122/79 | HR 74

## 2023-04-14 DIAGNOSIS — Z08 Encounter for follow-up examination after completed treatment for malignant neoplasm: Secondary | ICD-10-CM

## 2023-04-14 DIAGNOSIS — C678 Malignant neoplasm of overlapping sites of bladder: Secondary | ICD-10-CM | POA: Diagnosis not present

## 2023-04-14 DIAGNOSIS — R8289 Other abnormal findings on cytological and histological examination of urine: Secondary | ICD-10-CM | POA: Diagnosis not present

## 2023-04-14 DIAGNOSIS — Z8546 Personal history of malignant neoplasm of prostate: Secondary | ICD-10-CM

## 2023-04-14 DIAGNOSIS — Z8551 Personal history of malignant neoplasm of bladder: Secondary | ICD-10-CM | POA: Diagnosis not present

## 2023-04-14 LAB — URINALYSIS, ROUTINE W REFLEX MICROSCOPIC
Bilirubin, UA: NEGATIVE
Glucose, UA: NEGATIVE
Ketones, UA: NEGATIVE
Leukocytes,UA: NEGATIVE
Nitrite, UA: NEGATIVE
Protein,UA: NEGATIVE
Specific Gravity, UA: 1.02 (ref 1.005–1.030)
Urobilinogen, Ur: 1 mg/dL (ref 0.2–1.0)
pH, UA: 6.5 (ref 5.0–7.5)

## 2023-04-14 LAB — MICROSCOPIC EXAMINATION: Bacteria, UA: NONE SEEN

## 2023-04-14 MED ORDER — CIPROFLOXACIN HCL 500 MG PO TABS
500.0000 mg | ORAL_TABLET | Freq: Once | ORAL | Status: AC
Start: 2023-04-14 — End: 2023-04-14
  Administered 2023-04-14: 500 mg via ORAL

## 2023-04-14 NOTE — Progress Notes (Signed)
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  Tumor location was right anterior/lateral bladder wall--approximately 4 cm in size. Gemcitabine placed postoperatively.   Path--HG NMIBC   5.18.2023: Repeat resection.  Pathology all benign.  6.23.2023: Here for follow-up.  Still with troublesome urinary leakage.  This is been a bother since his first TURBT in March.  Most of his urine comes out in his diaper.  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  8.3.2023: Completed 6th induction BCG Rx   9.26.2023: Cysto negative   10.31.2023: Finished 1st round of BCG maintenance.   1.23.2024: Cystoscopy negative, cytology atypical cells  2.27.2024: Completed second round of BCG maintenance  4.16.2024: Cysto negative, cytology atypical cells.   6.6.2024: Completed 3rd BCG maintenance.  He did have significant pain for about 4 weeks after his last BCG infusion on the sixth.  8.20.2024: Cystoscopy was negative.  Cytology revealed atypia.  9.24.2024: CT abdomen/pelvis with contrast revealed no evidence of recurrent/metastatic disease  11.26.2024: Here today for recheck.  He has had no gross hematuria.  Stable frequency and urgency as well as urgency incontinence. Past Medical History:  Diagnosis Date   Anemia    Bladder cancer [facility])    urologist-- dr Retta Diones   Chronic constipation    GAD (generalized anxiety disorder)    GERD (gastroesophageal reflux disease)    History of atrial fibrillation    ED visit in recovery post tympanoplasty [phone] with brief episode atrial fib, per note cardiology recommended pt follow up with pcp   History of CVA  (cerebrovascular accident) without residual deficits 04/2021   hospital admission ([facility]) 04-29-2021 dx orthostatic hypotension, pneumonia, dehydration, generalized weakness, stroke;  per MRI in care everywhere small chronic cortical infarct within left parietal lobe postcenral gyrus   History of external beam radiation therapy 2000   salvage post prostatectomy   History of prostate cancer 2000   followed by dr Retta Diones;   s/p  radical prostatectomy and salvage radiation in 2000   Hypertension    followed by pcp;;  (hx admission [facility] 2012 had normal nuclear stress test & echo 08-19-2010 in epic) ;;   previously seen by cardiology , dr Erroll Luna note in epic 06-15-2017   Lower urinary tract symptoms (LUTS)    OA (osteoarthritis)    Retinal cyst of right eye    per pt w/ floaters   Strabismic amblyopia of left eye    per pt w/ decreased vision , stated 20/ 200   Tachycardia    Urinary incontinence     Past Surgical History:  Procedure Laterality Date   CATARACT EXTRACTION W/ INTRAOCULAR LENS IMPLANT Bilateral    2020   CYSTOSCOPY W/ RETROGRADES Bilateral 07/22/2021   Procedure: CYSTOSCOPY WITH RETROGRADE PYELOGRAM;  Surgeon: Marcine Matar, MD;  Location: [facility];  Service: Urology;  Laterality: Bilateral;   INSERTION OF MESH N/A 11/23/2013   Procedure: INSERTION OF MESH;  Surgeon: Dalia Heading, MD;  Location: [facility];  Service: General;  Laterality: N/A;   KNEE ARTHROSCOPY Right    unsure date   LUMBAR SPINE SURGERY     x2  in 1980s   PERCUTANEOUS  PINNING Right    age 56;    right foot fracture   POSTERIOR CERVICAL LAMINECTOMY     1990s   RETROPUBIC PROSTATECTOMY  2000   @[facility] :   per pt had Right inguinal hernia repair   ROTATOR CUFF REPAIR Right 2004   STRABISMUS SURGERY Left    age 40 and 55   THUMB AMPUTATION Left 2003   revision of amputation injusry   TOTAL KNEE ARTHROPLASTY Left 09/25/2020   Procedure: TOTAL KNEE ARTHROPLASTY;  Surgeon:  Durene Romans, MD;  Location: [facility];  Service: Orthopedics;  Laterality: Left;  70 mins   TRANSURETHRAL RESECTION OF BLADDER TUMOR N/A 10/03/2021   Procedure: REPEAT TRANSURETHRAL RESECTION OF BLADDER TUMOR (TURBT);  Surgeon: Marcine Matar, MD;  Location: [facility];  Service: Urology;  Laterality: N/A;  45 MINS   TRANSURETHRAL RESECTION OF BLADDER TUMOR WITH MITOMYCIN-C N/A 07/22/2021   Procedure: TRANSURETHRAL RESECTION OF BLADDER TUMOR WITH GEMCITABINE;  Surgeon: Marcine Matar, MD;  Location: [facility];  Service: Urology;  Laterality: N/A;   TYMPANOPLASTY Left 02/22/2018   Procedure: TYMPANOPLASTY;  Surgeon: Serena Colonel, MD;  Location: [facility];  Service: ENT;  Laterality: Left;   UMBILICAL HERNIA REPAIR N/A 11/23/2013   Procedure: UMBILICAL HERNIORRHAPHY;  Surgeon: Dalia Heading, MD;  Location: [facility];  Service: General;  Laterality: N/A;    Home Medications:  Allergies as of 04/14/2023       Reactions   Morphine And Codeine Anaphylaxis   Oxycodone Other (See Comments)   Itchy / jumpy (Percocet)        Medication List        Accurate as of April 14, 2023 12:34 PM. If you have any questions, ask your nurse or doctor.          acetaminophen 500 MG tablet Commonly known as: TYLENOL Take 1,000 mg by mouth as needed for mild pain.   ALPRAZolam 0.5 MG tablet Commonly known as: XANAX Take 0.5 mg by mouth 3 (three) times daily as needed for anxiety.   cetirizine 10 MG tablet Commonly known as: ZYRTEC Take by mouth.   ezetimibe 10 MG tablet Commonly known as: ZETIA Take 10 mg by mouth daily.   fluticasone 50 MCG/ACT nasal spray Commonly known as: FLONASE 2 sprays.   losartan 25 MG tablet Commonly known as: COZAAR Take 25 mg by mouth daily.   Loteprednol Etabonate 0.5 % Gel Place 1 drop into the right eye daily.   Myrbetriq 25 MG Tb24 tablet Generic drug: mirabegron ER   oxybutynin 5 MG  tablet Commonly known as: DITROPAN Take 5 mg by mouth every 8 (eight) hours as needed for bladder spasms.   pantoprazole 40 MG tablet Commonly known as: PROTONIX Take 40 mg by mouth daily.   Prolensa 0.07 % Soln Generic drug: Bromfenac Sodium Place 1 drop into the right eye at bedtime.        Allergies:  Allergies  Allergen Reactions   Morphine And Codeine Anaphylaxis   Oxycodone Other (See Comments)    Itchy / jumpy (Percocet)    Family History  Problem Relation Age of Onset   Alzheimer's disease Mother    Alzheimer's disease Father    Prostate cancer Father    Alzheimer's disease Paternal Grandfather    Allergic rhinitis Neg Hx    Asthma Neg Hx    Eczema Neg Hx    Immunodeficiency Neg Hx    Urticaria Neg Hx  Social History:  reports that he quit smoking about 19 years ago. His smoking use included cigarettes. He started smoking about 39 years ago. He has a 5 pack-year smoking history. He has never used smokeless tobacco. He reports that he does not drink alcohol and does not use drugs.  ROS: A complete review of systems was performed.  All systems are negative except for pertinent findings as noted.  Physical Exam:  Vital signs in last 24 hours: There were no vitals taken for this visit. Constitutional:  Alert and oriented, No acute distress Cardiovascular: Regular rate  Respiratory: Normal respiratory effort Neurologic: Grossly intact, no focal deficits Psychiatric: Normal mood and affect  I have reviewed prior pt notes  I have reviewed urinalysis results  I have independently reviewed prior imaging  I have reviewed prior pathology and cytology results  I have reviewed prior urine cultures  Last PSA from April of this year was undetectable.   Cystoscopy Procedure Note:  Indication: Bladder cancer surveillance  After informed consent and discussion of the procedure and its risks, Philip Mcneil was positioned and prepped in the standard fashion.   Cystoscopy was performed with a flexible cystoscope.   Findings: Urethra: No stricture or lesion Prostate: Absent Bladder neck: Mild contracture Ureteral orifices: Normal bilaterally Bladder: No urothelial lesions.  Stable erythematous patches-1 in the left upper trigone, 1 in the right trigone.  Similar in appearance to prior cystoscopies.  The patient tolerated the procedure well.      Impression/Assessment:  High-grade nonmuscle invasive bladder cancer, completing induction and 3 maintenance BCG treatments.  He does have atypia on last cytology.  Cystoscopy today revealed persistent erythema posteriorly in his bladder but no raised lesions  History of prostate cancer, status post radical prostatectomy and salvage radiation with no evidence of recurrence, last PSA 7 months ago undetectable  Plan:  Bladder washings and voided urine sent for cytology today  I will have him come back in 4 months for next cystoscopy

## 2023-04-16 LAB — CYTOLOGY, URINE

## 2023-04-21 ENCOUNTER — Telehealth: Payer: Self-pay

## 2023-04-21 NOTE — Telephone Encounter (Signed)
Patient is aware of MD response and will follow up as scheduled.

## 2023-04-21 NOTE — Telephone Encounter (Signed)
-----   Message from Bertram Millard Dahlstedt sent at 04/20/2023  3:25 PM EST ----- Let pt know that cytology still shows some atypical cells, but no cancer cells--cont same f/u schedule ----- Message ----- From: Interface, [facility] Lab Results In Sent: 04/14/2023   3:36 PM EST To: Marcine Matar, MD

## 2023-05-29 ENCOUNTER — Encounter (INDEPENDENT_AMBULATORY_CARE_PROVIDER_SITE_OTHER): Payer: Medicare Other | Admitting: Ophthalmology

## 2023-05-29 DIAGNOSIS — H35033 Hypertensive retinopathy, bilateral: Secondary | ICD-10-CM

## 2023-05-29 DIAGNOSIS — I10 Essential (primary) hypertension: Secondary | ICD-10-CM | POA: Diagnosis not present

## 2023-05-29 DIAGNOSIS — H35372 Puckering of macula, left eye: Secondary | ICD-10-CM

## 2023-05-29 DIAGNOSIS — H59031 Cystoid macular edema following cataract surgery, right eye: Secondary | ICD-10-CM | POA: Diagnosis not present

## 2023-05-29 DIAGNOSIS — H43813 Vitreous degeneration, bilateral: Secondary | ICD-10-CM | POA: Diagnosis not present

## 2023-07-27 DIAGNOSIS — Z8679 Personal history of other diseases of the circulatory system: Secondary | ICD-10-CM | POA: Diagnosis not present

## 2023-07-27 DIAGNOSIS — Z01818 Encounter for other preprocedural examination: Secondary | ICD-10-CM | POA: Diagnosis not present

## 2023-07-30 DIAGNOSIS — D123 Benign neoplasm of transverse colon: Secondary | ICD-10-CM | POA: Diagnosis not present

## 2023-07-30 DIAGNOSIS — Z87891 Personal history of nicotine dependence: Secondary | ICD-10-CM | POA: Diagnosis not present

## 2023-07-30 DIAGNOSIS — K635 Polyp of colon: Secondary | ICD-10-CM | POA: Diagnosis not present

## 2023-07-30 DIAGNOSIS — I10 Essential (primary) hypertension: Secondary | ICD-10-CM | POA: Diagnosis not present

## 2023-07-30 DIAGNOSIS — D125 Benign neoplasm of sigmoid colon: Secondary | ICD-10-CM | POA: Diagnosis not present

## 2023-07-30 DIAGNOSIS — K5901 Slow transit constipation: Secondary | ICD-10-CM | POA: Diagnosis not present

## 2023-07-30 DIAGNOSIS — Z79899 Other long term (current) drug therapy: Secondary | ICD-10-CM | POA: Diagnosis not present

## 2023-07-30 DIAGNOSIS — Z885 Allergy status to narcotic agent status: Secondary | ICD-10-CM | POA: Diagnosis not present

## 2023-07-30 DIAGNOSIS — K649 Unspecified hemorrhoids: Secondary | ICD-10-CM | POA: Diagnosis not present

## 2023-07-30 DIAGNOSIS — K620 Anal polyp: Secondary | ICD-10-CM | POA: Diagnosis not present

## 2023-07-30 DIAGNOSIS — E785 Hyperlipidemia, unspecified: Secondary | ICD-10-CM | POA: Diagnosis not present

## 2023-07-30 DIAGNOSIS — K5730 Diverticulosis of large intestine without perforation or abscess without bleeding: Secondary | ICD-10-CM | POA: Diagnosis not present

## 2023-07-30 DIAGNOSIS — K641 Second degree hemorrhoids: Secondary | ICD-10-CM | POA: Diagnosis not present

## 2023-07-30 DIAGNOSIS — Z8679 Personal history of other diseases of the circulatory system: Secondary | ICD-10-CM | POA: Diagnosis not present

## 2023-07-30 DIAGNOSIS — R194 Change in bowel habit: Secondary | ICD-10-CM | POA: Diagnosis not present

## 2023-07-30 DIAGNOSIS — Z8551 Personal history of malignant neoplasm of bladder: Secondary | ICD-10-CM | POA: Diagnosis not present

## 2023-07-30 DIAGNOSIS — D122 Benign neoplasm of ascending colon: Secondary | ICD-10-CM | POA: Diagnosis not present

## 2023-07-30 DIAGNOSIS — Z8546 Personal history of malignant neoplasm of prostate: Secondary | ICD-10-CM | POA: Diagnosis not present

## 2023-08-17 NOTE — Progress Notes (Signed)
 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 Last PSA 08/2022--0.  3.6.2023: Initial TURBT of a large bladder tumor.  Tumor location was right anterior/lateral bladder wall--approximately 4 cm in size. Gemcitabine placed postoperatively.   Path--HG NMIBC   5.18.2023: Repeat resection.  Pathology all benign.  6.23.2023: Here for follow-up.  Still with troublesome urinary leakage.  This is been a bother since his first TURBT in March.  Most of his urine comes out in his diaper.  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  8.3.2023: Completed 6th induction BCG Rx   9.26.2023: Cysto negative   10.31.2023: Finished 1st round of BCG maintenance.   1.23.2024: Cystoscopy negative, cytology atypical cells  2.27.2024: Completed second round of BCG maintenance  4.16.2024: Cysto negative, cytology atypical cells.   6.6.2024: Completed 3rd BCG maintenance.  He did have significant pain for about 4 weeks after his last BCG infusion on the sixth.  8.20.2024: Cystoscopy was negative.  Cytology revealed atypia.  9.24.2024: CT abdomen/pelvis with contrast revealed no evidence of recurrent/metastatic disease  11.26.2024: cytology inconclusive.  4.1.2025: Urinary situation at the same as usual-leakage but no recent UTIs or blood in his urine.  Last PSA was 1 year ago. Past Medical History:  Diagnosis Date   Anemia    Bladder cancer [facility])    urologist-- dr Retta Diones   Chronic constipation    GAD (generalized anxiety disorder)    GERD (gastroesophageal reflux disease)    History of atrial fibrillation    ED visit in recovery post tympanoplasty [phone] with brief episode atrial fib, per note cardiology  recommended pt follow up with pcp   History of CVA (cerebrovascular accident) without residual deficits 04/2021   hospital admission ([facility]) 04-29-2021 dx orthostatic hypotension, pneumonia, dehydration, generalized weakness, stroke;  per MRI in care everywhere small chronic cortical infarct within left parietal lobe postcenral gyrus   History of external beam radiation therapy 2000   salvage post prostatectomy   History of prostate cancer 2000   followed by dr Retta Diones;   s/p  radical prostatectomy and salvage radiation in 2000   Hypertension    followed by pcp;;  (hx admission [facility] 2012 had normal nuclear stress test & echo 08-19-2010 in epic) ;;   previously seen by cardiology , dr Erroll Luna note in epic 06-15-2017   Lower urinary tract symptoms (LUTS)    OA (osteoarthritis)    Retinal cyst of right eye    per pt w/ floaters   Strabismic amblyopia of left eye    per pt w/ decreased vision , stated 20/ 200   Tachycardia    Urinary incontinence     Past Surgical History:  Procedure Laterality Date   CATARACT EXTRACTION W/ INTRAOCULAR LENS IMPLANT Bilateral    2020   CYSTOSCOPY W/ RETROGRADES Bilateral 07/22/2021   Procedure: CYSTOSCOPY WITH RETROGRADE PYELOGRAM;  Surgeon: Marcine Matar, MD;  Location: [facility];  Service: Urology;  Laterality: Bilateral;   INSERTION OF MESH N/A 11/23/2013   Procedure: INSERTION OF MESH;  Surgeon: Dalia Heading, MD;  Location: [facility];  Service: General;  Laterality: N/A;   KNEE ARTHROSCOPY Right    unsure date   LUMBAR SPINE SURGERY  x2  in 1980s   PERCUTANEOUS PINNING Right    age 63;    right foot fracture   POSTERIOR CERVICAL LAMINECTOMY     1990s   RETROPUBIC PROSTATECTOMY  2000   @[facility] :   per pt had Right inguinal hernia repair   ROTATOR CUFF REPAIR Right 2004   STRABISMUS SURGERY Left    age 25 and 11   THUMB AMPUTATION Left 2003   revision of amputation injusry   TOTAL KNEE ARTHROPLASTY Left  09/25/2020   Procedure: TOTAL KNEE ARTHROPLASTY;  Surgeon: Durene Romans, MD;  Location: [facility];  Service: Orthopedics;  Laterality: Left;  70 mins   TRANSURETHRAL RESECTION OF BLADDER TUMOR N/A 10/03/2021   Procedure: REPEAT TRANSURETHRAL RESECTION OF BLADDER TUMOR (TURBT);  Surgeon: Marcine Matar, MD;  Location: [facility];  Service: Urology;  Laterality: N/A;  45 MINS   TRANSURETHRAL RESECTION OF BLADDER TUMOR WITH MITOMYCIN-C N/A 07/22/2021   Procedure: TRANSURETHRAL RESECTION OF BLADDER TUMOR WITH GEMCITABINE;  Surgeon: Marcine Matar, MD;  Location: [facility];  Service: Urology;  Laterality: N/A;   TYMPANOPLASTY Left 02/22/2018   Procedure: TYMPANOPLASTY;  Surgeon: Serena Colonel, MD;  Location: [facility];  Service: ENT;  Laterality: Left;   UMBILICAL HERNIA REPAIR N/A 11/23/2013   Procedure: UMBILICAL HERNIORRHAPHY;  Surgeon: Dalia Heading, MD;  Location: [facility];  Service: General;  Laterality: N/A;    Home Medications:  Allergies as of 08/18/2023       Reactions   Morphine And Codeine Anaphylaxis   Oxycodone Other (See Comments)   Itchy / jumpy (Percocet)        Medication List        Accurate as of August 17, 2023  6:37 AM. If you have any questions, ask your nurse or doctor.          acetaminophen 500 MG tablet Commonly known as: TYLENOL Take 1,000 mg by mouth as needed for mild pain.   ALPRAZolam 0.5 MG tablet Commonly known as: XANAX Take 0.5 mg by mouth 3 (three) times daily as needed for anxiety.   cetirizine 10 MG tablet Commonly known as: ZYRTEC Take by mouth.   ezetimibe 10 MG tablet Commonly known as: ZETIA Take 10 mg by mouth daily.   fluticasone 50 MCG/ACT nasal spray Commonly known as: FLONASE 2 sprays.   losartan 25 MG tablet Commonly known as: COZAAR Take 25 mg by mouth daily.   Loteprednol Etabonate 0.5 % Gel Place 1 drop into the right eye daily.   Myrbetriq 25 MG Tb24  tablet Generic drug: mirabegron ER   oxybutynin 5 MG tablet Commonly known as: DITROPAN Take 5 mg by mouth every 8 (eight) hours as needed for bladder spasms.   pantoprazole 40 MG tablet Commonly known as: PROTONIX Take 40 mg by mouth daily.   Prolensa 0.07 % Soln Generic drug: Bromfenac Sodium Place 1 drop into the right eye at bedtime.        Allergies:  Allergies  Allergen Reactions   Morphine And Codeine Anaphylaxis   Oxycodone Other (See Comments)    Itchy / jumpy (Percocet)    Family History  Problem Relation Age of Onset   Alzheimer's disease Mother    Alzheimer's disease Father    Prostate cancer Father    Alzheimer's disease Paternal Grandfather    Allergic rhinitis Neg Hx    Asthma Neg Hx    Eczema Neg Hx    Immunodeficiency Neg Hx  Urticaria Neg Hx     Social History:  reports that he quit smoking about 19 years ago. His smoking use included cigarettes. He started smoking about 39 years ago. He has a 5 pack-year smoking history. He has never used smokeless tobacco. He reports that he does not drink alcohol and does not use drugs.  ROS: A complete review of systems was performed.  All systems are negative except for pertinent findings as noted.  Physical Exam:  Vital signs in last 24 hours: There were no vitals taken for this visit. Constitutional:  Alert and oriented, No acute distress Cardiovascular: Regular rate  Respiratory: Normal respiratory effort Neurologic: Grossly intact, no focal deficits Psychiatric: Normal mood and affect  I have reviewed prior pt notes  I have reviewed urinalysis results  I have independently reviewed prior imaging  I have reviewed prior pathology and cytology results  Last PSA from April of this year was undetectable.   Cystoscopy Procedure Note:  Indication: Bladder cancer surveillance  After informed consent and discussion of the procedure and its risks, Philip Mcneil was positioned and prepped in the  standard fashion.  Cystoscopy was performed with a flexible cystoscope.   Findings: Urethra: No stricture or lesion Prostate: Absent Bladder neck: No contracture Ureteral orifices: Normal bilaterally Bladder: Stable appearance-erythema in right lateral trigonal area.  No raised urothelial lesions.  The patient tolerated the procedure well.       Impression/Assessment:  High-grade nonmuscle invasive bladder cancer, completing induction and 3 maintenance BCG treatments.  He does have atypia on last cytology.  Cystoscopy today once again revealed persistent erythema posteriorly in his bladder but no raised lesions  History of prostate cancer, status post radical prostatectomy and salvage radiation with no evidence of recurrence, last PSA a year ago ago undetectable  Plan:  -Cystoscopy covered with Cipro today  -PSA, urine cytologies today  -Office visit in 4 months with cystoscopy

## 2023-08-18 ENCOUNTER — Ambulatory Visit (INDEPENDENT_AMBULATORY_CARE_PROVIDER_SITE_OTHER): Payer: Medicare Other | Admitting: Urology

## 2023-08-18 VITALS — BP 146/78 | HR 84

## 2023-08-18 DIAGNOSIS — Z8546 Personal history of malignant neoplasm of prostate: Secondary | ICD-10-CM

## 2023-08-18 DIAGNOSIS — N3289 Other specified disorders of bladder: Secondary | ICD-10-CM

## 2023-08-18 DIAGNOSIS — Z8551 Personal history of malignant neoplasm of bladder: Secondary | ICD-10-CM | POA: Diagnosis not present

## 2023-08-18 DIAGNOSIS — N320 Bladder-neck obstruction: Secondary | ICD-10-CM

## 2023-08-18 DIAGNOSIS — R32 Unspecified urinary incontinence: Secondary | ICD-10-CM

## 2023-08-18 DIAGNOSIS — R8289 Other abnormal findings on cytological and histological examination of urine: Secondary | ICD-10-CM | POA: Diagnosis not present

## 2023-08-18 LAB — URINALYSIS, ROUTINE W REFLEX MICROSCOPIC
Bilirubin, UA: NEGATIVE
Glucose, UA: NEGATIVE
Ketones, UA: NEGATIVE
Leukocytes,UA: NEGATIVE
Nitrite, UA: NEGATIVE
Protein,UA: NEGATIVE
RBC, UA: NEGATIVE
Specific Gravity, UA: 1.01 (ref 1.005–1.030)
Urobilinogen, Ur: 0.2 mg/dL (ref 0.2–1.0)
pH, UA: 7 (ref 5.0–7.5)

## 2023-08-18 MED ORDER — CIPROFLOXACIN HCL 500 MG PO TABS
500.0000 mg | ORAL_TABLET | Freq: Once | ORAL | Status: AC
  Administered 2023-08-18: 500 mg via ORAL

## 2023-08-19 LAB — CYTOLOGY, URINE

## 2023-08-19 LAB — PSA: Prostate Specific Ag, Serum: <0.1 ng/mL (ref 0.0–4.0)

## 2023-08-21 ENCOUNTER — Other Ambulatory Visit: Payer: Self-pay | Admitting: Urology

## 2023-08-21 DIAGNOSIS — Z8551 Personal history of malignant neoplasm of bladder: Secondary | ICD-10-CM

## 2023-08-24 DIAGNOSIS — K5790 Diverticulosis of intestine, part unspecified, without perforation or abscess without bleeding: Secondary | ICD-10-CM | POA: Diagnosis not present

## 2023-08-24 DIAGNOSIS — D126 Benign neoplasm of colon, unspecified: Secondary | ICD-10-CM | POA: Diagnosis not present

## 2023-08-25 ENCOUNTER — Telehealth: Payer: Self-pay

## 2023-08-25 NOTE — Telephone Encounter (Signed)
 Called Pt to relay message per MD and scheduled lab visit Pt voiced understanding and will be at appt tomorrow

## 2023-08-25 NOTE — Telephone Encounter (Signed)
-----   Message from [facility] Kourtney B sent at 08/25/2023  1:16 PM EDT ----- Please see MD response below. ----- Message ----- From: Marcine Matar, MD Sent: 08/21/2023   7:27 AM EDT To: Grier Rocher, CMA  Please call pt--psa still 0--good news. Urine cytology still shows atypical cells. I would like to see if we can obtain a urine and get it sent to another lab--cytology w/ FISH. See if we can get that and let him know that we are going to send a more specialized urine test. ----- Message ----- From: Interface, [facility] Lab Results In Sent: 08/18/2023   3:37 PM EDT To: Marcine Matar, MD

## 2023-08-26 ENCOUNTER — Other Ambulatory Visit

## 2023-08-26 DIAGNOSIS — C678 Malignant neoplasm of overlapping sites of bladder: Secondary | ICD-10-CM | POA: Diagnosis not present

## 2023-08-26 DIAGNOSIS — Z8546 Personal history of malignant neoplasm of prostate: Secondary | ICD-10-CM | POA: Diagnosis not present

## 2023-08-26 NOTE — Progress Notes (Signed)
 Cytology tracking Number: [id number]

## 2023-09-03 DIAGNOSIS — I10 Essential (primary) hypertension: Secondary | ICD-10-CM | POA: Diagnosis not present

## 2023-09-03 DIAGNOSIS — E782 Mixed hyperlipidemia: Secondary | ICD-10-CM | POA: Diagnosis not present

## 2023-09-03 DIAGNOSIS — E7849 Other hyperlipidemia: Secondary | ICD-10-CM | POA: Diagnosis not present

## 2023-09-03 DIAGNOSIS — R5383 Other fatigue: Secondary | ICD-10-CM | POA: Diagnosis not present

## 2023-09-03 DIAGNOSIS — K2100 Gastro-esophageal reflux disease with esophagitis, without bleeding: Secondary | ICD-10-CM | POA: Diagnosis not present

## 2023-09-10 DIAGNOSIS — Z6833 Body mass index (BMI) 33.0-33.9, adult: Secondary | ICD-10-CM | POA: Diagnosis not present

## 2023-09-10 DIAGNOSIS — F419 Anxiety disorder, unspecified: Secondary | ICD-10-CM | POA: Diagnosis not present

## 2023-09-10 DIAGNOSIS — R5383 Other fatigue: Secondary | ICD-10-CM | POA: Diagnosis not present

## 2023-09-10 DIAGNOSIS — C679 Malignant neoplasm of bladder, unspecified: Secondary | ICD-10-CM | POA: Diagnosis not present

## 2023-09-10 DIAGNOSIS — Z8679 Personal history of other diseases of the circulatory system: Secondary | ICD-10-CM | POA: Diagnosis not present

## 2023-09-21 ENCOUNTER — Telehealth: Payer: Self-pay

## 2023-09-21 NOTE — Telephone Encounter (Signed)
 Lvm to call back to get results of cytology

## 2023-09-21 NOTE — Telephone Encounter (Signed)
-----   Message from Malcolm Scrivener Dahlstedt sent at 09/21/2023 12:06 PM EDT ----- Let patient know that the final molecular cytology showed no evidence of cancer. ----- Message ----- From: Dyke Glasser, CMA Sent: 09/02/2023   8:01 AM EDT To: Trent Frizzle, MD  FISH results

## 2023-09-21 NOTE — Telephone Encounter (Signed)
Pt called back and given results

## 2023-10-19 DIAGNOSIS — H40051 Ocular hypertension, right eye: Secondary | ICD-10-CM | POA: Diagnosis not present

## 2023-11-03 DIAGNOSIS — H938X1 Other specified disorders of right ear: Secondary | ICD-10-CM | POA: Diagnosis not present

## 2023-11-03 DIAGNOSIS — Z6833 Body mass index (BMI) 33.0-33.9, adult: Secondary | ICD-10-CM | POA: Diagnosis not present

## 2023-11-23 ENCOUNTER — Encounter (INDEPENDENT_AMBULATORY_CARE_PROVIDER_SITE_OTHER): Payer: Medicare Other | Admitting: Ophthalmology

## 2023-11-23 DIAGNOSIS — H35033 Hypertensive retinopathy, bilateral: Secondary | ICD-10-CM | POA: Diagnosis not present

## 2023-11-23 DIAGNOSIS — H35372 Puckering of macula, left eye: Secondary | ICD-10-CM

## 2023-11-23 DIAGNOSIS — H43813 Vitreous degeneration, bilateral: Secondary | ICD-10-CM | POA: Diagnosis not present

## 2023-11-23 DIAGNOSIS — H59031 Cystoid macular edema following cataract surgery, right eye: Secondary | ICD-10-CM | POA: Diagnosis not present

## 2023-11-23 DIAGNOSIS — I10 Essential (primary) hypertension: Secondary | ICD-10-CM

## 2023-11-30 DIAGNOSIS — L814 Other melanin hyperpigmentation: Secondary | ICD-10-CM | POA: Diagnosis not present

## 2023-11-30 DIAGNOSIS — L821 Other seborrheic keratosis: Secondary | ICD-10-CM | POA: Diagnosis not present

## 2023-11-30 DIAGNOSIS — Z85828 Personal history of other malignant neoplasm of skin: Secondary | ICD-10-CM | POA: Diagnosis not present

## 2023-12-21 NOTE — Progress Notes (Addendum)
 Impression/Assessment:  History of high-grade nonmuscle invasive bladder cancer, completing induction and 3 maintenance BCG treatments.  He does have atypia on last cytology.  Cystoscopy today unchanged, normal for him. Currently no evidence of recurrence.  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 Last PSA 08/2022--0.  3.6.2023: Initial TURBT of a large bladder tumor.  Tumor location was right anterior/lateral bladder wall--approximately 4 cm in size. Gemcitabine  placed postoperatively.   Path--HG NMIBC   5.18.2023: Repeat resection.  Pathology all benign.  6.23.2023: Here for follow-up.  Still with troublesome urinary leakage.  This is been a bother since his first TURBT in March.  Most of his urine comes out in his diaper.  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  8.3.2023: Completed 6th induction BCG Rx   9.26.2023: Cysto negative   10.31.2023: Finished 1st round of BCG maintenance.   1.23.2024: Cystoscopy negative, cytology atypical cells  2.27.2024: Completed second round of BCG maintenance  4.16.2024: Cysto negative, cytology atypical cells.   6.6.2024: Completed 3rd BCG maintenance.  He did have significant pain for about 4 weeks after his last BCG infusion on the sixth.  8.20.2024: Cystoscopy was negative.  Cytology revealed atypia.  9.24.2024: CT abdomen/pelvis with contrast revealed no evidence of recurrent/metastatic  disease  11.26.2024: cytology inconclusive.  4.1.2025:  Last PSA was 1 year ago. Cysto negative, cytology inconclusive   8.5.2025: His PSA was drawn in April of this year and was undetectable.  No significant issues with blood in the urine since last visit.  Past Surgical History:  Procedure Laterality Date   CATARACT EXTRACTION W/ INTRAOCULAR LENS IMPLANT Bilateral    2020   CYSTOSCOPY W/ RETROGRADES Bilateral 07/22/2021   Procedure: CYSTOSCOPY WITH RETROGRADE PYELOGRAM;  Surgeon: Matilda Senior, MD;  Location: [facility];  Service: Urology;  Laterality: Bilateral;   INSERTION OF MESH N/A 11/23/2013   Procedure: INSERTION OF MESH;  Surgeon: Oneil DELENA Budge, MD;  Location: [facility];  Service: General;  Laterality: N/A;   KNEE ARTHROSCOPY Right    unsure date   LUMBAR SPINE SURGERY     x2  in 1980s   PERCUTANEOUS PINNING Right    age 48;    right foot fracture   POSTERIOR CERVICAL LAMINECTOMY     1990s   RETROPUBIC PROSTATECTOMY  2000   @[facility] :   per pt had Right inguinal hernia repair   ROTATOR CUFF REPAIR Right 2004   STRABISMUS SURGERY Left    age 23 and 12   THUMB AMPUTATION Left 2003   revision of amputation injusry   TOTAL KNEE ARTHROPLASTY Left 09/25/2020   Procedure: TOTAL KNEE ARTHROPLASTY;  Surgeon: Ernie Cough, MD;  Location: [facility];  Service: Orthopedics;  Laterality: Left;  70 mins   TRANSURETHRAL RESECTION OF BLADDER TUMOR N/A 10/03/2021   Procedure: REPEAT TRANSURETHRAL RESECTION OF BLADDER TUMOR (TURBT);  Surgeon: Matilda Senior, MD;  Location: [facility];  Service: Urology;  Laterality: N/A;  45  MINS   TRANSURETHRAL RESECTION OF BLADDER TUMOR WITH MITOMYCIN -C N/A 07/22/2021   Procedure: TRANSURETHRAL RESECTION OF BLADDER TUMOR WITH GEMCITABINE ;  Surgeon: Matilda Senior, MD;  Location: [facility];  Service: Urology;  Laterality: N/A;   TYMPANOPLASTY Left 02/22/2018   Procedure: TYMPANOPLASTY;  Surgeon: Jesus Oliphant, MD;  Location: [facility];  Service: ENT;  Laterality: Left;   UMBILICAL HERNIA REPAIR N/A 11/23/2013   Procedure: UMBILICAL HERNIORRHAPHY;  Surgeon: Oneil DELENA Budge, MD;  Location: [facility];  Service: General;  Laterality: N/A;    Home Medications:  Allergies as of 12/22/2023       Reactions   Morphine And Codeine Anaphylaxis   Oxycodone  Other (See Comments)   Itchy / jumpy (Percocet)        Medication List        Accurate as of December 21, 2023  6:32 AM. If you have any questions, ask your nurse or doctor.          acetaminophen  500 MG tablet Commonly known as: TYLENOL  Take 1,000 mg by mouth as needed for mild pain.   ALPRAZolam  0.5 MG tablet Commonly known as: XANAX  Take 0.5 mg by mouth 3 (three) times daily as needed for anxiety.   cetirizine 10 MG tablet Commonly known as: ZYRTEC Take by mouth.   ezetimibe 10 MG tablet Commonly known as: ZETIA Take 10 mg by mouth daily.   fluticasone  50 MCG/ACT nasal spray Commonly known as: FLONASE  2 sprays.   losartan  25 MG tablet Commonly known as: COZAAR  Take 25 mg by mouth daily.   Loteprednol  Etabonate 0.5 % Gel Place 1 drop into the right eye daily.   Myrbetriq 25 MG Tb24 tablet Generic drug: mirabegron ER   oxybutynin 5 MG tablet Commonly known as: DITROPAN Take 5 mg by mouth every 8 (eight) hours as needed for bladder spasms.   pantoprazole  40 MG tablet Commonly known as: PROTONIX  Take 40 mg by mouth daily.   Prolensa 0.07 % Soln Generic drug: Bromfenac Sodium Place 1 drop into the right eye at bedtime.        Allergies:  Allergies  Allergen Reactions   Morphine And Codeine Anaphylaxis   Oxycodone  Other (See Comments)    Itchy / jumpy (Percocet)    Family History  Problem Relation Age of Onset   Alzheimer's disease Mother    Alzheimer's disease Father    Prostate cancer Father    Alzheimer's disease Paternal Grandfather    Allergic rhinitis Neg Hx    Asthma Neg Hx     Eczema Neg Hx    Immunodeficiency Neg Hx    Urticaria Neg Hx     Social History:  reports that he quit smoking about 20 years ago. His smoking use included cigarettes. He started smoking about 40 years ago. He has a 5 pack-year smoking history. He has never used smokeless tobacco. He reports that he does not drink alcohol  and does not use drugs.  ROS: A complete review of systems was performed.  All systems are negative except for pertinent findings as noted.  Physical Exam:  Vital signs in last 24 hours: There were no vitals taken for this visit. Constitutional:  Alert and oriented, No acute distress Cardiovascular: Regular rate  Respiratory: Normal respiratory effort Neurologic: Grossly intact, no focal deficits Psychiatric: Normal mood and affect  I have reviewed prior pt notes  I have reviewed urinalysis results  I have independently reviewed prior imaging  I have reviewed prior  pathology and cytology results  Last PSA from April of this year was undetectable.   Cystoscopy Procedure Note:  Indication: Bladder cancer surveillance  After informed consent and discussion of the procedure and its risks, Philip Mcneil was positioned and prepped in the standard fashion.  Cystoscopy was performed with a flexible cystoscope.   Findings: Urethra: No stricture or lesion Prostate: Absent, minimal sphincter activity Bladder neck: Open Ureteral orifices: Normal bilaterally Bladder: No urothelial lesions noted  The patient tolerated the procedure well.

## 2023-12-22 ENCOUNTER — Ambulatory Visit: Admitting: Urology

## 2023-12-22 VITALS — BP 143/87 | HR 84

## 2023-12-22 DIAGNOSIS — Z8546 Personal history of malignant neoplasm of prostate: Secondary | ICD-10-CM

## 2023-12-22 DIAGNOSIS — Z8551 Personal history of malignant neoplasm of bladder: Secondary | ICD-10-CM

## 2023-12-22 DIAGNOSIS — R8289 Other abnormal findings on cytological and histological examination of urine: Secondary | ICD-10-CM | POA: Diagnosis not present

## 2023-12-22 DIAGNOSIS — N320 Bladder-neck obstruction: Secondary | ICD-10-CM

## 2023-12-22 DIAGNOSIS — Z08 Encounter for follow-up examination after completed treatment for malignant neoplasm: Secondary | ICD-10-CM

## 2023-12-22 DIAGNOSIS — R32 Unspecified urinary incontinence: Secondary | ICD-10-CM

## 2023-12-22 LAB — URINALYSIS, ROUTINE W REFLEX MICROSCOPIC
Bilirubin, UA: NEGATIVE
Glucose, UA: NEGATIVE
Ketones, UA: NEGATIVE
Leukocytes,UA: NEGATIVE
Nitrite, UA: NEGATIVE
Protein,UA: NEGATIVE
Specific Gravity, UA: 1.015 (ref 1.005–1.030)
Urobilinogen, Ur: 1 mg/dL (ref 0.2–1.0)
pH, UA: 7.5 (ref 5.0–7.5)

## 2023-12-22 LAB — MICROSCOPIC EXAMINATION
Bacteria, UA: NONE SEEN
WBC, UA: NONE SEEN /HPF (ref 0–5)

## 2023-12-22 MED ORDER — CIPROFLOXACIN HCL 500 MG PO TABS
500.0000 mg | ORAL_TABLET | Freq: Once | ORAL | Status: AC
Start: 2023-12-22 — End: 2023-12-22
  Administered 2023-12-22: 500 mg via ORAL

## 2023-12-23 LAB — CYTOLOGY, URINE

## 2023-12-29 ENCOUNTER — Ambulatory Visit: Payer: Self-pay

## 2023-12-29 NOTE — Telephone Encounter (Signed)
 Called pt to give him cytology results per MD dahlstedt pt voiced his understanding and confirmed f/u appt

## 2023-12-29 NOTE — Telephone Encounter (Signed)
-----   Message from Garnette HERO Dahlstedt sent at 12/29/2023 10:14 AM EDT ----- Please call pt--cytology reading as usual--they didn't see cancer cells but called it inconclusive I think we should continue same follow up schedule ----- Message ----- From: Interface, [facility] Lab Results In Sent: 12/22/2023   3:36 PM EDT To: Garnette Shack, MD

## 2024-01-25 DIAGNOSIS — H40051 Ocular hypertension, right eye: Secondary | ICD-10-CM | POA: Diagnosis not present

## 2024-02-02 DIAGNOSIS — Z23 Encounter for immunization: Secondary | ICD-10-CM | POA: Diagnosis not present

## 2024-02-29 DIAGNOSIS — L98499 Non-pressure chronic ulcer of skin of other sites with unspecified severity: Secondary | ICD-10-CM | POA: Diagnosis not present

## 2024-02-29 DIAGNOSIS — L570 Actinic keratosis: Secondary | ICD-10-CM | POA: Diagnosis not present

## 2024-02-29 DIAGNOSIS — D485 Neoplasm of uncertain behavior of skin: Secondary | ICD-10-CM | POA: Diagnosis not present

## 2024-03-15 DIAGNOSIS — H40052 Ocular hypertension, left eye: Secondary | ICD-10-CM | POA: Diagnosis not present

## 2024-03-29 DIAGNOSIS — Z1329 Encounter for screening for other suspected endocrine disorder: Secondary | ICD-10-CM | POA: Diagnosis not present

## 2024-03-29 DIAGNOSIS — R5383 Other fatigue: Secondary | ICD-10-CM | POA: Diagnosis not present

## 2024-03-29 DIAGNOSIS — I10 Essential (primary) hypertension: Secondary | ICD-10-CM | POA: Diagnosis not present

## 2024-03-29 DIAGNOSIS — E7849 Other hyperlipidemia: Secondary | ICD-10-CM | POA: Diagnosis not present

## 2024-03-29 DIAGNOSIS — D559 Anemia due to enzyme disorder, unspecified: Secondary | ICD-10-CM | POA: Diagnosis not present

## 2024-03-29 DIAGNOSIS — Z1321 Encounter for screening for nutritional disorder: Secondary | ICD-10-CM | POA: Diagnosis not present

## 2024-03-29 DIAGNOSIS — R7309 Other abnormal glucose: Secondary | ICD-10-CM | POA: Diagnosis not present

## 2024-04-05 DIAGNOSIS — C679 Malignant neoplasm of bladder, unspecified: Secondary | ICD-10-CM | POA: Diagnosis not present

## 2024-04-05 DIAGNOSIS — F419 Anxiety disorder, unspecified: Secondary | ICD-10-CM | POA: Diagnosis not present

## 2024-04-05 DIAGNOSIS — R5383 Other fatigue: Secondary | ICD-10-CM | POA: Diagnosis not present

## 2024-04-05 DIAGNOSIS — H6122 Impacted cerumen, left ear: Secondary | ICD-10-CM | POA: Diagnosis not present

## 2024-04-05 DIAGNOSIS — I10 Essential (primary) hypertension: Secondary | ICD-10-CM | POA: Diagnosis not present

## 2024-04-05 DIAGNOSIS — K2100 Gastro-esophageal reflux disease with esophagitis, without bleeding: Secondary | ICD-10-CM | POA: Diagnosis not present

## 2024-04-05 DIAGNOSIS — Z6833 Body mass index (BMI) 33.0-33.9, adult: Secondary | ICD-10-CM | POA: Diagnosis not present

## 2024-05-05 DIAGNOSIS — H40051 Ocular hypertension, right eye: Secondary | ICD-10-CM | POA: Diagnosis not present

## 2024-05-23 ENCOUNTER — Encounter (INDEPENDENT_AMBULATORY_CARE_PROVIDER_SITE_OTHER): Admitting: Ophthalmology

## 2024-05-23 DIAGNOSIS — I10 Essential (primary) hypertension: Secondary | ICD-10-CM | POA: Diagnosis not present

## 2024-05-23 DIAGNOSIS — H35033 Hypertensive retinopathy, bilateral: Secondary | ICD-10-CM | POA: Diagnosis not present

## 2024-05-23 DIAGNOSIS — H35372 Puckering of macula, left eye: Secondary | ICD-10-CM | POA: Diagnosis not present

## 2024-05-23 DIAGNOSIS — H43813 Vitreous degeneration, bilateral: Secondary | ICD-10-CM

## 2024-05-23 DIAGNOSIS — H59031 Cystoid macular edema following cataract surgery, right eye: Secondary | ICD-10-CM

## 2024-06-28 ENCOUNTER — Other Ambulatory Visit: Admitting: Urology

## 2024-07-08 ENCOUNTER — Other Ambulatory Visit: Admitting: Urology

## 2024-11-28 ENCOUNTER — Encounter (INDEPENDENT_AMBULATORY_CARE_PROVIDER_SITE_OTHER): Admitting: Ophthalmology
